# Patient Record
Sex: Male | Born: 1965
Health system: Southern US, Community
[De-identification: ages and names within clinical notes are randomized; demographics above are authoritative.]

## PROBLEM LIST (undated history)

## (undated) DIAGNOSIS — E785 Hyperlipidemia, unspecified: Secondary | ICD-10-CM

## (undated) DIAGNOSIS — N503 Cyst of epididymis: Secondary | ICD-10-CM

## (undated) DIAGNOSIS — Z8249 Family history of ischemic heart disease and other diseases of the circulatory system: Secondary | ICD-10-CM

## (undated) DIAGNOSIS — I10 Essential (primary) hypertension: Secondary | ICD-10-CM

## (undated) DIAGNOSIS — Z87898 Personal history of other specified conditions: Secondary | ICD-10-CM

## (undated) DIAGNOSIS — N433 Hydrocele, unspecified: Secondary | ICD-10-CM

## (undated) DIAGNOSIS — I445 Left posterior fascicular block: Secondary | ICD-10-CM

## (undated) DIAGNOSIS — Z973 Presence of spectacles and contact lenses: Secondary | ICD-10-CM

## (undated) HISTORY — DX: Hyperlipidemia, unspecified: E78.5

## (undated) HISTORY — PX: HERNIA REPAIR: SHX51

## (undated) HISTORY — DX: Essential (primary) hypertension: I10

---

## 2000-06-15 ENCOUNTER — Encounter: Payer: Self-pay | Admitting: Emergency Medicine

## 2000-06-15 ENCOUNTER — Emergency Department (HOSPITAL_COMMUNITY): Admission: EM | Admit: 2000-06-15 | Discharge: 2000-06-15 | Payer: Self-pay | Admitting: Emergency Medicine

## 2000-10-29 HISTORY — PX: WISDOM TOOTH EXTRACTION: SHX21

## 2005-06-11 ENCOUNTER — Encounter: Admission: RE | Admit: 2005-06-11 | Discharge: 2005-06-11 | Payer: Self-pay | Admitting: Family Medicine

## 2006-06-19 ENCOUNTER — Ambulatory Visit: Payer: Self-pay

## 2007-09-03 ENCOUNTER — Emergency Department (HOSPITAL_COMMUNITY): Admission: EM | Admit: 2007-09-03 | Discharge: 2007-09-03 | Payer: Self-pay | Admitting: Emergency Medicine

## 2010-10-29 HISTORY — PX: COLONOSCOPY: SHX174

## 2011-04-13 ENCOUNTER — Emergency Department (HOSPITAL_COMMUNITY): Payer: BC Managed Care – PPO

## 2011-04-13 ENCOUNTER — Observation Stay (HOSPITAL_COMMUNITY)
Admission: EM | Admit: 2011-04-13 | Discharge: 2011-04-14 | DRG: 143 | Disposition: A | Discharge status: Home / Self Care | Payer: BC Managed Care – PPO | Attending: Family Medicine | Admitting: Family Medicine

## 2011-04-13 DIAGNOSIS — I10 Essential (primary) hypertension: Secondary | ICD-10-CM

## 2011-04-13 DIAGNOSIS — K219 Gastro-esophageal reflux disease without esophagitis: Secondary | ICD-10-CM

## 2011-04-13 DIAGNOSIS — R1011 Right upper quadrant pain: Secondary | ICD-10-CM | POA: Diagnosis present

## 2011-04-13 DIAGNOSIS — R0789 Other chest pain: Principal | ICD-10-CM | POA: Diagnosis present

## 2011-04-13 DIAGNOSIS — R109 Unspecified abdominal pain: Secondary | ICD-10-CM

## 2011-04-13 DIAGNOSIS — K7689 Other specified diseases of liver: Secondary | ICD-10-CM | POA: Diagnosis present

## 2011-04-13 DIAGNOSIS — E669 Obesity, unspecified: Secondary | ICD-10-CM | POA: Diagnosis present

## 2011-04-13 DIAGNOSIS — E781 Pure hyperglyceridemia: Secondary | ICD-10-CM | POA: Diagnosis present

## 2011-04-13 DIAGNOSIS — Z7982 Long term (current) use of aspirin: Secondary | ICD-10-CM

## 2011-04-13 LAB — DIFFERENTIAL
Basophils Absolute: 0 10*3/uL (ref 0.0–0.1)
Basophils Relative: 0 % (ref 0–1)
Eosinophils Absolute: 0.2 10*3/uL (ref 0.0–0.7)
Eosinophils Relative: 4 % (ref 0–5)
Lymphocytes Relative: 18 % (ref 12–46)
Lymphs Abs: 1.1 10*3/uL (ref 0.7–4.0)
Monocytes Absolute: 0.2 10*3/uL (ref 0.1–1.0)
Monocytes Relative: 3 % (ref 3–12)
Neutro Abs: 4.8 10*3/uL (ref 1.7–7.7)
Neutrophils Relative %: 75 % (ref 43–77)

## 2011-04-13 LAB — HEPATIC FUNCTION PANEL
AST: 37 U/L (ref 0–37)
Albumin: 4.2 g/dL (ref 3.5–5.2)
Total Bilirubin: 1 mg/dL (ref 0.3–1.2)
Total Protein: 7.5 g/dL (ref 6.0–8.3)

## 2011-04-13 LAB — CK TOTAL AND CKMB (NOT AT ARMC)
CK, MB: 2.7 ng/mL (ref 0.3–4.0)
Relative Index: 1 (ref 0.0–2.5)
Total CK: 280 U/L — ABNORMAL HIGH (ref 7–232)

## 2011-04-13 LAB — LIPASE, BLOOD: Lipase: 31 U/L (ref 11–59)

## 2011-04-13 LAB — CBC
HCT: 44.8 % (ref 39.0–52.0)
Hemoglobin: 16.5 g/dL (ref 13.0–17.0)
MCH: 33.6 pg (ref 26.0–34.0)
MCHC: 36.8 g/dL — ABNORMAL HIGH (ref 30.0–36.0)
MCV: 91.2 fL (ref 78.0–100.0)
Platelets: 168 10*3/uL (ref 150–400)
RBC: 4.91 MIL/uL (ref 4.22–5.81)
RDW: 12.6 % (ref 11.5–15.5)
WBC: 6.4 10*3/uL (ref 4.0–10.5)

## 2011-04-13 LAB — BASIC METABOLIC PANEL
Calcium: 9.7 mg/dL (ref 8.4–10.5)
GFR calc Af Amer: >60 mL/min (ref >60)
GFR calc non Af Amer: >60 mL/min (ref >60)
Glucose, Bld: 120 mg/dL — ABNORMAL HIGH (ref 70–99)
Potassium: 4 mEq/L (ref 3.5–5.1)
Sodium: 137 mEq/L (ref 135–145)

## 2011-04-13 LAB — TROPONIN I
Troponin I: <0.3 ng/mL (ref <0.30)
Troponin I: <0.3 ng/mL (ref <0.30)

## 2011-04-14 LAB — CBC
HCT: 44.3 % (ref 39.0–52.0)
MCHC: 35.2 g/dL (ref 30.0–36.0)
Platelets: 181 10*3/uL (ref 150–400)
RDW: 12.6 % (ref 11.5–15.5)

## 2011-04-14 LAB — TSH: TSH: 1.884 u[IU]/mL (ref 0.350–4.500)

## 2011-04-14 LAB — COMPREHENSIVE METABOLIC PANEL
Albumin: 3.8 g/dL (ref 3.5–5.2)
BUN: 21 mg/dL (ref 6–23)
Calcium: 9.6 mg/dL (ref 8.4–10.5)
GFR calc Af Amer: >60 mL/min (ref >60)
Glucose, Bld: 103 mg/dL — ABNORMAL HIGH (ref 70–99)
Sodium: 138 mEq/L (ref 135–145)
Total Protein: 7 g/dL (ref 6.0–8.3)

## 2011-04-14 LAB — LIPID PANEL
Cholesterol: 188 mg/dL (ref 0–200)
HDL: 27 mg/dL — ABNORMAL LOW (ref >39)
Total CHOL/HDL Ratio: 7 RATIO

## 2011-04-14 LAB — HEMOGLOBIN A1C: Mean Plasma Glucose: 114 mg/dL (ref <117)

## 2011-04-16 NOTE — Discharge Summary (Signed)
  NAMEMarland Kitchen  Andrew [name], Andrew [name] NO.:  [id number]  MEDICAL RECORD NO.:  [id number]  LOCATION:  3742                         FACILITY:  [facility]  PHYSICIAN:  Andrew [name], M.D.DATE OF BIRTH:  30-Jul-1966  DATE OF ADMISSION:  04/13/2011 DATE OF DISCHARGE:  04/14/2011                              DISCHARGE SUMMARY   PRIMARY CARE PROVIDER:  Brett Canales A. Andrew Alberts, MD at [facility].  DISCHARGE DIAGNOSES: 1. Atypical chest pain. 2. Abdominal pain. 3. Gastroesophageal reflux disease likely. 4. Hypertension. 5. Obesity.  DISCHARGE MEDICATIONS: 1. Nitroglycerin 0.4 SL p.r.n. every 5 minutes x3 doses. 2. Omeprazole 20 mg daily by mouth. 3. Aspirin 81 mg daily by mouth. 4. Lisinopril/hydrochlorothiazide 10/12.5 daily by mouth. 5. Multivitamin daily. 6. Vitamin D3 2000 units daily.  LAB FINDINGS:  Comprehensive metabolic panel was essentially normal with exception of CO2 of 33.  Cardiac enzymes were negative x3.  Lipid profile showed total cholesterol 188, triglyceride 396, HDL 27, LDL 82. CBC was normal.  TSH 1.84, lipase was 31.  RADIOLOGY:  Chest x-ray showed no acute disease and abdominal ultrasound showed a normal gallbladder with a normal caliber common bile duct, mild diffuse fatty infiltration of the liver and the rest of his abdomen was normal.  BRIEF HOSPITAL COURSE:  [name] is a 46 year old male who presented with atypical chest pain on the 15th.  Please see HPI for further details of presenting symptoms.  He did well in the hospital. He was treated with a proton pump inhibitor which resulted in resolution of his abdominal discomfort.  At discharge he is feeling well.  On discharge given instructions for use of nitroglycerin and the importance of taking omeprazole.  Plan to follow up with his PCP for a risk factor stratification of stress test within the next 30 days and follow up comprehensive metabolic panel to ensure no worsening liver function  in the next 7 days.  ISSUES TO BE FOLLOWED UP:  Please see above. 1. Stress test preferably within next 30 days. 2. Comprehensive metabolic panel preferably within the next 7 days to     ensure no worsening liver function.  DISCHARGE INSTRUCTIONS:  Discussed with the patient signs and symptoms, return to healthcare, and how to use nitroglycerin and warning signs for MI.     Andrew Graham, MD   ______________________________ Andrew [name], M.D.    EC/MEDQ  D:  04/14/2011  T:  04/14/2011  Job:  [id number]  cc:   Andrew [name], M.D.  Electronically Signed by Andrew [name]  on 04/16/2011 09:20:54 AM Electronically Signed by Andrew [name] M.D. on 04/16/2011 12:39:56 PM

## 2011-04-16 NOTE — H&P (Signed)
NAMEMarland Kitchen  Andrew [name], Andrew [name] NO.:  [id number]  MEDICAL RECORD NO.:  [id number]  LOCATION:  3742                         FACILITY:  [facility]  PHYSICIAN:  Leighton Roach McDiarmid, M.D.DATE OF BIRTH:  11-19-1965  DATE OF ADMISSION:  04/13/2011 DATE OF DISCHARGE:                             HISTORY & PHYSICAL   PRIMARY CARE PROVIDER:  Stan Head. Cleta Alberts, MD, at [facility].  CHIEF COMPLAINT:  Chest pain yesterday evening following a large meal, felt like squeezing.  It was substernal, not radiating, better with exertion, was associated with burping.  Notes diaphoresis when he exerted.  Has changed his diet this week to a low-fat/high-fiber diet after Dr. Cleta Alberts told him that he had hypertriglyceridemia.  He felt weak most of this week.  No fevers or chills.  No abdominal pain, nausea, vomiting or diarrhea.  PAST MEDICAL HISTORY: 1. Hypertension. 2. Hypertriglyceridemia. 3. Obesity. 4. Normal stress exam 4 years ago per Dr. Cleta Alberts.  SURGICAL HISTORY:  No abdominal surgeries.  ALLERGIES:  No known drug allergies.  MEDICATIONS: 1. Lisinopril/hydrochlorothiazide 10/12.5 mg daily. 2. Aspirin 81 mg daily. 3. Vitamin D3 2000 units daily. 4. Multivitamin daily.  SOCIAL HISTORY:  No tobacco.  FAMILY HISTORY:  Mother and father both had myocardial infarction in their 60s.  Dad died at 81 years' old.  REVIEW OF SYSTEMS:  Please see HPI, otherwise is normal.  OBJECTIVE:  VITAL SIGNS:  Temperature 97.4, heart rate 97, blood pressure 131-160/74-89, respiratory 20, satting 95% on room air. GENERAL:  Obese, no acute distress. HEENT:  Moist mucous membranes.  EOMI. NECK:  Flat neck veins. HEART:  Regular rate and rhythm.  No murmurs, rubs, or gallops. LUNGS:  Clear to auscultation bilaterally.  Normal work of breathing. ABDOMEN:  Normoactive bowel sounds, nontender, no masses.  Positive tenderness to palpation in the right upper quadrant with a positive Murphy sign.  No rebound  or guarding. EXTREMITIES:  Nonedematous. NEURO:  Alert and oriented x3. MSK:  Normal diameter of lower extremities with normal muscle bulk.  LABS AND STUDIES:  CBC:  White count 6.4, hemoglobin 16.5, platelets 168.  Basic metabolic panel is within normal limits.  Cardiac enzymes are negative x1.  Chest x-ray shows nothing acute.  EKG shows normal sinus rhythm with left axis deviation which is unchanged from previous study.  No ST-segment changes or abnormalities.  ASSESSMENT/PLAN:  A 45 year old male with chest pain. 1. Chest pain, atypical.  EKG is unchanged and cardiac enzymes are     negative x1.  I feel like myocardial infarction or pulmonary     embolism is likely cause of his pain.  His Well score is 1.3.  He     is very low risk for pulmonary embolism.  I feel like GI etiology     is much more likely.  I plan to cycle his enzymes x3, monitor on     telemetry and repeat EKG in the morning.  We will get fasting lipid     and TSH then. 2. Abdominal tenderness noted on exam right per quadrant.  The patient     is obese and has recently changed his diet.  I suspect reflux with  cholecystitis to be a cause of his pain.  We do not have a CMP at     this time.  It is pending.  Plan to add CMP, get right upper     quadrant ultrasound to assess for gallstones, and we will follow.     We will allow a low-fat diet. 3. Hypertension.  Blood pressures control.  Plan to continue his home     ACE inhibitor, hydrochlorothiazide.  Monitor blood pressure while     here in the hospital. 4. FEN/GI.  Heart healthy diet and Protonix. 5. Obesity.  It is likely underlying cause.  We will follow up when he     is in outpatient. 6. Prophylaxis, on heparin and Protonix. 7. Code status.  Full code. 8. Disposition.  Ruled out and abdominal pain is resolved.     Clementeen Graham, MD   ______________________________ Leighton Roach McDiarmid, M.D.    EC/MEDQ  D:  04/13/2011  T:  04/14/2011  Job:   [id number]  Electronically Signed by Clementeen Graham  on 04/16/2011 09:20:38 AM Electronically Signed by Acquanetta Belling M.D. on 04/16/2011 12:39:54 PM

## 2011-04-18 ENCOUNTER — Encounter: Payer: Self-pay | Admitting: *Deleted

## 2011-04-18 ENCOUNTER — Encounter (HOSPITAL_COMMUNITY): Payer: BC Managed Care – PPO | Admitting: Radiology

## 2011-04-19 ENCOUNTER — Ambulatory Visit (INDEPENDENT_AMBULATORY_CARE_PROVIDER_SITE_OTHER): Payer: BC Managed Care – PPO | Admitting: Cardiovascular Disease

## 2011-04-19 ENCOUNTER — Encounter: Payer: Self-pay | Admitting: Cardiovascular Disease

## 2011-04-19 DIAGNOSIS — R079 Chest pain, unspecified: Secondary | ICD-10-CM | POA: Insufficient documentation

## 2011-04-19 NOTE — Progress Notes (Signed)
History of Present Illness:45 yo WM with history of HTN who is here today to establish cardiology care. He reports feeling "bad" over last week. Both parents had premature CAD. He has had no energy. Constant weakness. He has constant chest pressure, present most of the day. This changes some with position. No SOB. No dizziness, near syncope or syncope. He exercises every day. He does not smoke. He changed his diet several weeks ago. Admitted to [facility] 04/13/11 with chest pain.   Past Medical History  Diagnosis Date  . Hyperlipidemia   . Hypertension     Past Surgical History  Procedure Date  . Hernia repair   . Wisdom teeth removal     Current Outpatient Prescriptions  Medication Sig Dispense Refill  . aspirin 81 MG tablet Take 81 mg by mouth daily.        Marland Kitchen lisinopril-hydrochlorothiazide (PRINZIDE,ZESTORETIC) 10-12.5 MG per tablet Take 1 tablet by mouth daily.        . Multiple Vitamin (MULTIVITAMIN) tablet Take 1 tablet by mouth daily.        . nitroGLYCERIN (NITROSTAT) 0.4 MG SL tablet Place 0.4 mg under the tongue every 5 (five) minutes as needed.        Marland Kitchen omeprazole (PRILOSEC) 20 MG capsule Take 20 mg by mouth daily.          No Known Allergies  History   Social History  . Marital Status: Married    Spouse Name: N/A    Number of Children: N/A  . Years of Education: N/A   Occupational History  . Not on file.   Social History Main Topics  . Smoking status: Never Smoker   . Smokeless tobacco: Not on file  . Alcohol Use: Not on file  . Drug Use: Not on file  . Sexually Active: Not on file   Other Topics Concern  . Not on file   Social History Narrative  . No narrative on file    No family history on file.  Review of Systems:  As stated in the HPI and otherwise negative.   BP 126/74  Pulse 89  Resp 12  Ht 5\' 7"  (1.702 m)  Wt 265 lb (120.203 kg)  BMI 41.50 kg/m2  Physical Examination: General: Well developed, well nourished, NAD HEENT: OP  clear, mucus membranes moist SKIN: warm, dry. No rashes. Neuro: No focal deficits Musculoskeletal: Muscle strength 5/5 all ext Psychiatric: Mood and affect normal Neck: No JVD, no carotid bruits, no thyromegaly, no lymphadenopathy. Lungs:Clear bilaterally, no wheezes, rhonci, crackles Cardiovascular: Regular rate and rhythm. No murmurs, gallops or rubs. Abdomen:Soft. Bowel sounds present. Non-tender.  Extremities: No lower extremity edema. Pulses are 2 + in the bilateral DP/PT.  EKG: NSR, rate 81 bpm.

## 2011-04-19 NOTE — Assessment & Plan Note (Addendum)
Strong family history of CAD with weakness, fatigue, chest pain. He has hypertriglyceridemia and HTN. Will arrange exercise myoview to exclude ischemia. I have offered a treadmill only test but given his strong family history of CAD, he has stated that he would rather have a myoview.

## 2011-04-19 NOTE — Patient Instructions (Signed)
Your physician recommends that you schedule a follow-up appointment in: 3 weeks  Your physician has requested that you have en exercise stress myoview. For further information please visit [email or web address] Please follow instruction sheet, as given.

## 2011-04-23 ENCOUNTER — Ambulatory Visit (HOSPITAL_COMMUNITY): Payer: BC Managed Care – PPO | Attending: Internal Medicine | Admitting: Radiology

## 2011-04-23 DIAGNOSIS — R079 Chest pain, unspecified: Secondary | ICD-10-CM | POA: Insufficient documentation

## 2011-04-23 DIAGNOSIS — R0789 Other chest pain: Secondary | ICD-10-CM

## 2011-04-23 DIAGNOSIS — I4949 Other premature depolarization: Secondary | ICD-10-CM

## 2011-04-23 HISTORY — PX: CARDIOVASCULAR STRESS TEST: SHX262

## 2011-04-23 MED ORDER — TECHNETIUM TC 99M TETROFOSMIN IV KIT
11.0000 | PACK | Freq: Once | INTRAVENOUS | Status: AC | PRN
  Administered 2011-04-23: 11 via INTRAVENOUS

## 2011-04-23 MED ORDER — TECHNETIUM TC 99M TETROFOSMIN IV KIT
33.0000 | PACK | Freq: Once | INTRAVENOUS | Status: AC | PRN
  Administered 2011-04-23: 33 via INTRAVENOUS

## 2011-04-23 NOTE — Progress Notes (Signed)
[facility] SITE 3 NUCLEAR MED [address] [place] [address] [phone]  Cardiology Nuclear Med Study  CHIA ROCK is a 45 y.o. male [id number] December 10, 1965   Nuclear Med Background Indication for Stress Test:  Evaluation for Ischemia and Post Hospital: 04/14/11 CP,(-) enzymes x 3 History: '07  Myocardial Perfusion Study:Normal, EF=62%. Cardiac Risk Factors:Strong, Premature Family History - CAD, Hypertension and Lipids, Obesity  Symptoms:  Chest Pressure.  (last episode of chest discomfort: none since discharge), Diaphoresis, Fatigue, Palpitations and Rapid HR   Nuclear Pre-Procedure Caffeine/Decaff Intake:  None NPO After: 9:30pm   Lungs:  Clear. IV 0.9% NS with Angio Cath:  20g  IV Site: R Antecubital  IV Started by:  Irean Hong, RN  Chest Size (in):  52 Cup Size: n/a  Height: 5\' 7"  (1.702 m)  Weight:  264 lb (119.75 kg)  BMI:  Body mass index is 41.35 kg/(m^2). Tech Comments:  A.M. Meds. taken    Nuclear Med Study 1 or 2 day study: 1 day  Stress Test Type:  Stress  Reading MD: Dietrich Pates, MD  Order Authorizing Provider:  Verne Carrow, MD; GM:WNUUV Daub, MD  Resting Radionuclide: Technetium 85m Tetrofosmin  Resting Radionuclide Dose: 11 mCi   Stress Radionuclide:  Technetium 50m Tetrofosmin  Stress Radionuclide Dose: 33 mCi           Stress Protocol Rest HR: 62 Stress HR: 169  Rest BP: 121/72 Stress BP: 181/64  Exercise Time (min): 10:30 METS: 12.6   Predicted Max HR: 176 bpm % Max HR: 96.02 bpm Rate Pressure Product: 25366   Dose of Adenosine (mg):  n/a Dose of Lexiscan: n/a mg  Dose of Atropine (mg): n/a Dose of Dobutamine: n/a mcg/kg/min (at max HR)  Stress Test Technologist: Smiley Houseman, CMA-N  Nuclear Technologist:  Domenic Polite, CNMT     Rest Procedure:  Myocardial perfusion imaging was performed at rest 45 minutes following the intravenous administration of Technetium 49m Tetrofosmin.  Rest ECG: No acute  changes.  Stress Procedure:  The patient exercised for 10:30 on the treadmill utilizing the Bruce protocol.  The patient stopped due to leg fatigue and denied any chest pain.  There were no significant ST-T wave changes, only occasional PVC's.  Technetium 71m Tetrofosmin was injected at peak exercise and myocardial perfusion imaging was performed after a brief delay.  Stress ECG: No significant change from baseline ECG  QPS Raw Data Images:  Normal; no motion artifact; normal heart/lung ratio. Stress Images:  Normal homogeneous uptake in all areas of the myocardium. Rest Images:  Normal homogeneous uptake in all areas of the myocardium. Subtraction (SDS):  No evidence of ischemia. Transient Ischemic Dilatation (Normal <1.22): .91  Lung/Heart Ratio (Normal <0.45):  .36   Quantitative Gated Spect Images QGS EDV:  111 ml QGS ESV:  40 ml QGS cine images:  NL LV Function; NL Wall Motion QGS EF: 64%  Impression Exercise Capacity:  Excellent exercise capacity. BP Response:  Normal blood pressure response. Clinical Symptoms:  No chest pain. ECG Impression:  No significant ST segment change suggestive of ischemia. Comparison with Prior Nuclear Study: No change from previous exam.  Overall Impression:  Normal stress nuclear study.

## 2011-04-24 NOTE — Progress Notes (Signed)
Nuc med report routed to Dr. Clifton James 04/24/11 Andrew [name]

## 2011-04-25 ENCOUNTER — Encounter (HOSPITAL_COMMUNITY): Payer: BC Managed Care – PPO | Admitting: Radiology

## 2011-04-25 ENCOUNTER — Telehealth: Payer: Self-pay | Admitting: Cardiovascular Disease

## 2011-04-25 NOTE — Telephone Encounter (Signed)
Stress test results from monday

## 2011-04-25 NOTE — Telephone Encounter (Signed)
LM on identified voice mail that stress test was normal--nt

## 2011-04-26 NOTE — Telephone Encounter (Signed)
Pt called again to get results of myoview

## 2011-04-26 NOTE — Telephone Encounter (Signed)
PT AWARE OF MYOVIEW RESULTS./CY 

## 2011-04-27 ENCOUNTER — Institutional Professional Consult (permissible substitution): Payer: BC Managed Care – PPO | Admitting: Internal Medicine

## 2011-04-27 NOTE — Progress Notes (Signed)
Normal Stress Test. Can we let the pt know? Thanks, chris

## 2011-04-27 NOTE — Progress Notes (Signed)
Pt. Aware of stress test results

## 2011-05-15 ENCOUNTER — Ambulatory Visit: Payer: BC Managed Care – PPO | Admitting: Cardiovascular Disease

## 2011-06-25 ENCOUNTER — Encounter: Payer: Self-pay | Admitting: Cardiovascular Disease

## 2011-06-26 ENCOUNTER — Encounter: Payer: Self-pay | Admitting: Cardiovascular Disease

## 2011-06-26 ENCOUNTER — Ambulatory Visit (INDEPENDENT_AMBULATORY_CARE_PROVIDER_SITE_OTHER): Payer: BC Managed Care – PPO | Admitting: Cardiovascular Disease

## 2011-06-26 DIAGNOSIS — R079 Chest pain, unspecified: Secondary | ICD-10-CM

## 2011-06-26 NOTE — Progress Notes (Signed)
History of Present Illness:45 yo WM with history of HTN who is here today for cardiac follow up. I saw him in June 2012  to establish cardiology care. He reported feeling "bad" for one week. Both parents had premature CAD. He had no energy. Constant weakness. He has constant chest pressure, present most of the day. This changes some with position. No SOB. No dizziness, near syncope or syncope. He exercises every day. He does not smoke. He changed his diet several weeks ago. Admitted to [facility] 04/13/11 with chest pain.  Stress myoview with no ischemia. LVEF normal. He has been feeling great and exercising every day. No more chest pain.    Past Medical History  Diagnosis Date  . Hyperlipidemia   . Hypertension   . GERD (gastroesophageal reflux disease)     Past Surgical History  Procedure Date  . Hernia repair   . Wisdom teeth removal     Current Outpatient Prescriptions  Medication Sig Dispense Refill  . aspirin 81 MG tablet Take 81 mg by mouth daily.        . Cholecalciferol (VITAMIN D3) 2000 UNITS TABS Take by mouth.        Marland Kitchen lisinopril-hydrochlorothiazide (PRINZIDE,ZESTORETIC) 10-12.5 MG per tablet Take 1 tablet by mouth daily.        . Multiple Vitamin (MULTIVITAMIN) tablet Take 1 tablet by mouth daily.        . nitroGLYCERIN (NITROSTAT) 0.4 MG SL tablet Place 0.4 mg under the tongue every 5 (five) minutes as needed.        Marland Kitchen omeprazole (PRILOSEC) 20 MG capsule Take 20 mg by mouth daily.          No Known Allergies  History   Social History  . Marital Status: Married    Spouse Name: N/A    Number of Children: N/A  . Years of Education: N/A   Occupational History  . Not on file.   Social History Main Topics  . Smoking status: Never Smoker   . Smokeless tobacco: Not on file  . Alcohol Use: Not on file  . Drug Use: Not on file  . Sexually Active: Not on file   Other Topics Concern  . Not on file   Social History Narrative  . No narrative on file     Family History  Problem Relation Age of Onset  . Heart attack Mother   . Heart attack Father     Review of Systems:  As stated in the HPI and otherwise negative.   BP 124/72  Pulse 80  Ht 5\' 7"  (1.702 m)  Wt 282 lb (127.914 kg)  BMI 44.17 kg/m2  Physical Examination: General: Well developed, well nourished, NAD HEENT: OP clear, mucus membranes moist SKIN: warm, dry. No rashes. Neuro: No focal deficits Musculoskeletal: Muscle strength 5/5 all ext Psychiatric: Mood and affect normal Neck: No JVD, no carotid bruits, no thyromegaly, no lymphadenopathy. Lungs:Clear bilaterally, no wheezes, rhonci, crackles Cardiovascular: Regular rate and rhythm. No murmurs, gallops or rubs. Abdomen:Soft. Bowel sounds present. Non-tender.  Extremities: No lower extremity edema. Pulses are 2 + in the bilateral DP/PT.  Stress myoview 04/23/11: Stress Procedure: The patient exercised for 10:30 on the treadmill utilizing the Bruce protocol. The patient stopped due to leg fatigue and denied any chest pain. There were no significant ST-T wave changes, only occasional PVC's. Technetium 43m Tetrofosmin was injected at peak exercise and myocardial perfusion imaging was performed after a brief delay.  Stress ECG: No significant  change from baseline ECG  QPS  Raw Data Images: Normal; no motion artifact; normal heart/lung ratio.  Stress Images: Normal homogeneous uptake in all areas of the myocardium.  Rest Images: Normal homogeneous uptake in all areas of the myocardium.  Subtraction (SDS): No evidence of ischemia.  Transient Ischemic Dilatation (Normal <1.22): .91  Lung/Heart Ratio (Normal <0.45): .36  Quantitative Gated Spect Images  QGS EDV: 111 ml  QGS ESV: 40 ml  QGS cine images: NL LV Function; NL Wall Motion  QGS EF: 64%  Impression  Exercise Capacity: Excellent exercise capacity.  BP Response: Normal blood pressure response.  Clinical Symptoms: No chest pain.  ECG Impression: No significant  ST segment change suggestive of ischemia.  Comparison with Prior Nuclear Study: No change from previous exam.  Overall Impression: Normal stress nuclear study.

## 2011-06-26 NOTE — Patient Instructions (Signed)
Your physician recommends that you schedule a follow-up appointment in: 1 year  

## 2011-06-26 NOTE — Assessment & Plan Note (Signed)
Atypical chest pain. Stress myoview without ischemia. NO further cardiac workup.

## 2011-07-04 ENCOUNTER — Other Ambulatory Visit: Payer: Self-pay | Admitting: Family Medicine

## 2011-11-13 ENCOUNTER — Encounter (INDEPENDENT_AMBULATORY_CARE_PROVIDER_SITE_OTHER): Payer: BC Managed Care – PPO | Admitting: Emergency Medicine

## 2011-11-13 DIAGNOSIS — Z0000 Encounter for general adult medical examination without abnormal findings: Secondary | ICD-10-CM

## 2011-11-13 DIAGNOSIS — C439 Malignant melanoma of skin, unspecified: Secondary | ICD-10-CM

## 2011-11-13 DIAGNOSIS — I10 Essential (primary) hypertension: Secondary | ICD-10-CM

## 2011-12-13 ENCOUNTER — Telehealth: Payer: Self-pay

## 2012-01-22 ENCOUNTER — Encounter: Payer: Self-pay | Admitting: Emergency Medicine

## 2012-01-22 ENCOUNTER — Ambulatory Visit: Payer: BC Managed Care – PPO

## 2012-01-22 ENCOUNTER — Ambulatory Visit (INDEPENDENT_AMBULATORY_CARE_PROVIDER_SITE_OTHER): Payer: BC Managed Care – PPO | Admitting: Emergency Medicine

## 2012-01-22 VITALS — BP 120/80 | HR 72 | Temp 98.4°F | Resp 16 | Ht 66.0 in | Wt 269.0 lb

## 2012-01-22 DIAGNOSIS — R05 Cough: Secondary | ICD-10-CM

## 2012-01-22 DIAGNOSIS — E669 Obesity, unspecified: Secondary | ICD-10-CM

## 2012-01-22 DIAGNOSIS — R059 Cough, unspecified: Secondary | ICD-10-CM

## 2012-01-22 DIAGNOSIS — R739 Hyperglycemia, unspecified: Secondary | ICD-10-CM

## 2012-01-22 DIAGNOSIS — R5383 Other fatigue: Secondary | ICD-10-CM

## 2012-01-22 DIAGNOSIS — G4733 Obstructive sleep apnea (adult) (pediatric): Secondary | ICD-10-CM

## 2012-01-22 DIAGNOSIS — R5381 Other malaise: Secondary | ICD-10-CM

## 2012-01-22 LAB — POCT GLYCOSYLATED HEMOGLOBIN (HGB A1C): Hemoglobin A1C: 5.3

## 2012-01-22 LAB — PSA: PSA: 0.99 ng/mL (ref <=4.00)

## 2012-01-22 LAB — TSH: TSH: 1.003 u[IU]/mL (ref 0.350–4.500)

## 2012-01-22 NOTE — Progress Notes (Signed)
  Subjective:    Patient ID: Andrew [name], male    DOB: 1966-02-12, 46 y.o.   MRN: [id number]  HPI patient presents with an note from his wife requesting additional blood work. When seen here last he had CBC cholesterol Cmet none. His sugar was borderline elevated at 101 but otherwise his tests were within normal limits    Review of Systems Mrs. states he is weak and fatigued. He does have an diagnosis of obstructive sleep apnea and uses a CPAP machine.     Objective:   Physical Exam  Constitutional:       Physical exam is of an overweight male who is in no distress.  HENT:  Head: Normocephalic.  Eyes: Pupils are equal, round, and reactive to light.  Neck: No JVD present. No tracheal deviation present. No thyromegaly present.  Cardiovascular: Normal rate and regular rhythm.   Pulmonary/Chest: Effort normal. No respiratory distress. He has no wheezes. He has no rales. He exhibits no tenderness.  Lymphadenopathy:    He has no cervical adenopathy.      [facility] reading (PRIMARY) by  DrDaub chest x-ray shows no acute disease.      Assessment & Plan:  Patient's main complaint is of fatigue. I suspect most of this is related to his extreme obesity and obstructive sleep apnea. We'll check vitamin D testosterone levels. The CXR done was within normal limits. He is scheduled to see Dr. Lilian Kapur for colonoscopy because of a problem with rectal bleeding.

## 2012-01-23 LAB — VITAMIN D 25 HYDROXY (VIT D DEFICIENCY, FRACTURES): Vit D, 25-Hydroxy: 35 ng/mL (ref 30–89)

## 2012-01-27 ENCOUNTER — Telehealth: Payer: Self-pay

## 2012-01-27 NOTE — Telephone Encounter (Signed)
Pt returning call to clinical tl

## 2012-01-28 NOTE — Telephone Encounter (Signed)
Spoke with patient sent him copy of labs in mail

## 2012-01-29 ENCOUNTER — Telehealth: Payer: Self-pay

## 2012-01-29 NOTE — Telephone Encounter (Signed)
Okay to call in Daniel term 4 mg per 24 hours one patch daily #30 refill x5

## 2012-01-29 NOTE — Telephone Encounter (Signed)
Called [facility] and spoke w/pharmacy. Changed Rx to Androderm 4 mg patches as written by Dr Cleta Alberts and asked them to fax over a new prior auth req. They are getting it changed and faxing so a prior auth can be sent in on androderm.

## 2012-01-29 NOTE — Telephone Encounter (Signed)
Dr Cleta Alberts, I am trying to get a prior auth done for pt's testim gel and [employer] prefers androderm or androgel. They will not approve the testim until/unless pt has failed one of these. Can you change Rx and I can get a prior auth done for either of these and it should be approved?

## 2012-04-08 ENCOUNTER — Other Ambulatory Visit: Payer: Self-pay | Admitting: Physician Assistant

## 2012-04-08 MED ORDER — LISINOPRIL-HYDROCHLOROTHIAZIDE 10-12.5 MG PO TABS: 1.0000 | ORAL_TABLET | Freq: Every day | ORAL | Status: DC

## 2012-05-13 ENCOUNTER — Ambulatory Visit (INDEPENDENT_AMBULATORY_CARE_PROVIDER_SITE_OTHER): Payer: BC Managed Care – PPO | Admitting: Emergency Medicine

## 2012-05-13 ENCOUNTER — Encounter: Payer: Self-pay | Admitting: Emergency Medicine

## 2012-05-13 VITALS — BP 121/77 | HR 58 | Temp 98.1°F | Resp 16 | Ht 66.5 in | Wt 272.8 lb

## 2012-05-13 DIAGNOSIS — I10 Essential (primary) hypertension: Secondary | ICD-10-CM

## 2012-05-13 DIAGNOSIS — E291 Testicular hypofunction: Secondary | ICD-10-CM

## 2012-05-13 DIAGNOSIS — R739 Hyperglycemia, unspecified: Secondary | ICD-10-CM

## 2012-05-13 DIAGNOSIS — I152 Hypertension secondary to endocrine disorders: Secondary | ICD-10-CM | POA: Insufficient documentation

## 2012-05-13 DIAGNOSIS — R7309 Other abnormal glucose: Secondary | ICD-10-CM

## 2012-05-13 LAB — POCT GLYCOSYLATED HEMOGLOBIN (HGB A1C): Hemoglobin A1C: 5.3

## 2012-05-13 MED ORDER — TESTOSTERONE 30 MG/ACT TD SOLN: 60.0000 mg | Freq: Once | TRANSDERMAL | Status: DC

## 2012-05-13 NOTE — Progress Notes (Signed)
  Subjective:    Patient ID: Andrew [name], male    DOB: 1966/08/18, 46 y.o.   MRN: [id number]  HPI patient tried the testosterone patches but was unable to tolerate them due to severe rash that developed due to the patches. He is here today for followup of hyperglycemia and hypertension    Review of Systems     Objective:   Physical Exam  Eyes: Pupils are equal, round, and reactive to light.  Neck: No thyromegaly present.  Cardiovascular: Normal rate and regular rhythm.   Pulmonary/Chest: Effort normal.   Results for orders placed in visit on 05/13/12  POCT GLYCOSYLATED HEMOGLOBIN (HGB A1C)      Component Value Range   Hemoglobin A1C 5.3           Assessment & Plan:     Will try axiron due to severe rash from patches. No change in his other medicines. Sugar is under good control with an A1c of 5 point 3.

## 2012-05-19 ENCOUNTER — Encounter: Payer: Self-pay | Admitting: Emergency Medicine

## 2012-05-19 NOTE — Progress Notes (Signed)
/  Completed prior auth on pt's Axiron Rx and received approval from 05/15/12 - 02/08/15. Faxed approval notice to pharmacy.

## 2012-09-09 ENCOUNTER — Ambulatory Visit (INDEPENDENT_AMBULATORY_CARE_PROVIDER_SITE_OTHER): Payer: BC Managed Care – PPO | Admitting: Emergency Medicine

## 2012-09-09 ENCOUNTER — Encounter: Payer: Self-pay | Admitting: Emergency Medicine

## 2012-09-09 VITALS — BP 126/92 | HR 67 | Temp 97.9°F | Resp 16 | Ht 67.0 in | Wt 273.8 lb

## 2012-09-09 DIAGNOSIS — E291 Testicular hypofunction: Secondary | ICD-10-CM

## 2012-09-09 DIAGNOSIS — I10 Essential (primary) hypertension: Secondary | ICD-10-CM

## 2012-09-09 LAB — COMPREHENSIVE METABOLIC PANEL
Alkaline Phosphatase: 49 U/L (ref 39–117)
BUN: 26 mg/dL — ABNORMAL HIGH (ref 6–23)
Glucose, Bld: 105 mg/dL — ABNORMAL HIGH (ref 70–99)
Total Bilirubin: 1.1 mg/dL (ref 0.3–1.2)

## 2012-09-09 NOTE — Progress Notes (Signed)
  Subjective:    Patient ID: Andrew [name], male    DOB: 12-Dec-1965, 46 y.o.   MRN: [id number]  HPI patient here to followup on hypogonadism. He tried the patches for testosterone replacement and then tried Actron in both of these calls extreme topical allergic reactions. He seemed to discuss a day alternative treatments. He also is on hypertensive medications were his biggest complaint is inability to lose weight    Review of Systems     Objective:   Physical Exam HEENT exam is unremarkable his chest is clear his cardiac exam is unremarkable. Lungs are clear to auscultation and percussion abdomen soft        Assessment & Plan:  Will refer to endocrinology to get their opinion regarding testosterone replacement. We'll continue his current blood pressure medications. Give him some information about obesity and see if he might be allergic continue to work on weight loss.

## 2012-09-10 LAB — TESTOSTERONE, FREE, TOTAL, SHBG
Sex Hormone Binding: 22 nmol/L (ref 13–71)
Testosterone, Free: 47 pg/mL (ref 47.0–244.0)
Testosterone-% Free: 2.4 % (ref 1.6–2.9)

## 2012-09-11 NOTE — Addendum Note (Signed)
Addended by: Gerrianne Scale on: 09/11/2012 11:29 AM   Modules accepted: Orders

## 2012-09-19 ENCOUNTER — Ambulatory Visit: Payer: BC Managed Care – PPO | Admitting: Endocrinology

## 2012-10-31 ENCOUNTER — Ambulatory Visit (INDEPENDENT_AMBULATORY_CARE_PROVIDER_SITE_OTHER): Payer: BC Managed Care – PPO | Admitting: Emergency Medicine

## 2012-10-31 VITALS — BP 134/81 | HR 78 | Temp 98.5°F | Resp 16 | Ht 68.0 in | Wt 278.6 lb

## 2012-10-31 DIAGNOSIS — R21 Rash and other nonspecific skin eruption: Secondary | ICD-10-CM

## 2012-10-31 DIAGNOSIS — L0390 Cellulitis, unspecified: Secondary | ICD-10-CM

## 2012-10-31 DIAGNOSIS — R1900 Intra-abdominal and pelvic swelling, mass and lump, unspecified site: Secondary | ICD-10-CM

## 2012-10-31 DIAGNOSIS — L309 Dermatitis, unspecified: Secondary | ICD-10-CM

## 2012-10-31 LAB — POCT SKIN KOH: Skin KOH, POC: NEGATIVE

## 2012-10-31 MED ORDER — DOXYCYCLINE HYCLATE 100 MG PO CAPS: 100.0000 mg | ORAL_CAPSULE | Freq: Two times a day (BID) | ORAL | Status: DC

## 2012-10-31 MED ORDER — TRIAMCINOLONE 0.1 % CREAM:EUCERIN CREAM 1:1: 1.0000 "application " | TOPICAL_CREAM | Freq: Two times a day (BID) | CUTANEOUS | Status: DC | PRN

## 2012-10-31 NOTE — Progress Notes (Signed)
  Subjective:    Patient ID: Andrew [name], male    DOB: 10/13/1966, 47 y.o.   MRN: [id number]  HPI patient enters with a one-month history of a rash over both lower legs. The area is extremely pruritic. It is not painful at present but apparently was very red earlier in the week but  the redness has improved. Patient has a full sensation upper abdomen. He has not had any vomiting. He has had no GI symptoms at all. He states he feels well .    Review of Systems     Objective:   Physical Exam  Constitutional: He appears well-developed.  HENT:  Right Ear: External ear normal.  Left Ear: External ear normal.  Eyes: Pupils are equal, round, and reactive to light.  Neck: No thyromegaly present.  Cardiovascular: Normal rate and regular rhythm.   Pulmonary/Chest: Breath sounds normal.  Abdominal:       There is fullness in the right upper abdomen but I do not feel a mass  Skin:       There is redness in this scaling involving both shins. The open areas some of which are weeping.   Results for orders placed in visit on 10/31/12  POCT SKIN KOH      Component Value Range   Skin KOH, POC Negative           Assessment & Plan:      It appears the patient has  atopic eczema involving his shins. We'll treat distribution triamcinolone mixed. Will cover him with doxycycline for secondary infection. He is going to followup with Dr. to 15

## 2012-10-31 NOTE — Patient Instructions (Addendum)

## 2012-11-02 LAB — WOUND CULTURE

## 2012-11-06 ENCOUNTER — Ambulatory Visit
Admission: RE | Admit: 2012-11-06 | Discharge: 2012-11-06 | Disposition: A | Discharge status: Home / Self Care | Payer: BC Managed Care – PPO | Source: Ambulatory Visit | Attending: Emergency Medicine | Admitting: Emergency Medicine

## 2012-11-06 DIAGNOSIS — R1900 Intra-abdominal and pelvic swelling, mass and lump, unspecified site: Secondary | ICD-10-CM

## 2012-11-11 ENCOUNTER — Telehealth: Payer: Self-pay

## 2012-11-11 NOTE — Telephone Encounter (Signed)
[place] normal per Dr Cleta Alberts, called patient to advise.

## 2012-11-11 NOTE — Telephone Encounter (Signed)
Pt is looking for ultrasound results  Please call [phone]

## 2012-11-14 ENCOUNTER — Telehealth: Payer: Self-pay

## 2012-11-14 DIAGNOSIS — L309 Dermatitis, unspecified: Secondary | ICD-10-CM

## 2012-11-14 DIAGNOSIS — L0390 Cellulitis, unspecified: Secondary | ICD-10-CM

## 2012-11-14 NOTE — Telephone Encounter (Signed)
Pt's wife is calling requesting a refill on the antibiotic and the cream pt was prescribed during his last visit with Dr.Daub. Pt's rash seemed to be improving while taking the antibiotic but now that he has completed the antibiotic it has come back. Please adivise. (Pts wife requests to only speak with Dr.Daub) [phone]# [phone] Pharmacy: [facility]

## 2012-11-16 ENCOUNTER — Telehealth: Payer: Self-pay | Admitting: Radiology

## 2012-11-16 MED ORDER — DOXYCYCLINE HYCLATE 100 MG PO CAPS: 100.0000 mg | ORAL_CAPSULE | Freq: Two times a day (BID) | ORAL | Status: DC

## 2012-11-16 MED ORDER — TRIAMCINOLONE 0.1 % CREAM:EUCERIN CREAM 1:1: 1.0000 "application " | TOPICAL_CREAM | Freq: Two times a day (BID) | CUTANEOUS | Status: DC | PRN

## 2012-11-16 NOTE — Telephone Encounter (Signed)
Pharmacy called about the compounded rx advised 1:1 ratio 420gms Amy

## 2012-11-16 NOTE — Telephone Encounter (Signed)
Ok to refill both meds. See me no improvement if at completion of this treatment course.

## 2012-11-16 NOTE — Telephone Encounter (Signed)
Called wife, [facility] RX sent to pharmacy.

## 2012-12-26 NOTE — Telephone Encounter (Signed)
error 

## 2013-01-13 ENCOUNTER — Encounter: Payer: Self-pay | Admitting: Emergency Medicine

## 2013-01-13 ENCOUNTER — Ambulatory Visit (INDEPENDENT_AMBULATORY_CARE_PROVIDER_SITE_OTHER): Payer: BC Managed Care – PPO | Admitting: Emergency Medicine

## 2013-01-13 VITALS — BP 138/82 | HR 78 | Temp 98.0°F | Resp 17 | Ht 67.0 in | Wt 278.0 lb

## 2013-01-13 DIAGNOSIS — L0390 Cellulitis, unspecified: Secondary | ICD-10-CM

## 2013-01-13 DIAGNOSIS — R21 Rash and other nonspecific skin eruption: Secondary | ICD-10-CM

## 2013-01-13 DIAGNOSIS — L0291 Cutaneous abscess, unspecified: Secondary | ICD-10-CM

## 2013-01-13 LAB — POCT SKIN KOH: Skin KOH, POC: NEGATIVE

## 2013-01-13 MED ORDER — DOXYCYCLINE HYCLATE 100 MG PO CAPS
100.0000 mg | ORAL_CAPSULE | Freq: Two times a day (BID) | ORAL | Status: DC
Start: 2013-01-13 — End: 2013-09-29

## 2013-01-13 NOTE — Progress Notes (Signed)
  Subjective:    Patient ID: Andrew [name], male    DOB: [birth year], 47 y.o.   MRN: [id number]  HPI  Patient comes in with a chief complain of leg swelling and rash for 6 months (since August). Left leg swelling with rash on anterior leg.  No pain. Used to bleed while in the shower.     Previous history of chest pain (2 years ago). The patient had a thorough evaluation with the cardiologist with negative findings. He was seen recently and treated for an eczema type eruption on his shins which was secondarily infected and he took antibiotics and then apply Eucerin and triamcinolone mixed. Patient states he feels like the [employer] worked better.    Review of Systems     Objective:   Physical Exam examination legs reveals no calf tenderness there is a negative Homans sign. There is skin discoloration present over both shins with some scaling there is no evidence of active cellulitis at present time Results for orders placed in visit on 10/31/12  WOUND CULTURE      Result Value Range   Culture Few STAPHYLOCOCCUS AUREUS     GRAM STAIN No WBC Seen     GRAM STAIN Rare Squamous Epithelial Cells Present     GRAM STAIN Few GRAM POSITIVE COCCI IN PAIRS     Organism ID, Bacteria STAPHYLOCOCCUS AUREUS    POCT SKIN KOH      Result Value Range   Skin KOH, POC Negative           Assessment & Plan:  I think are going and give him some plain Diprolene but he can alternate with the Gold Bond. I'm going to encourage him to lose weight and wear support stockings to see if that would help with his varicose veins. He has physical scheduled in the near future. Patient will be on doxycycline for 10 days. His last culture grew MSSA.

## 2013-01-19 ENCOUNTER — Other Ambulatory Visit: Payer: Self-pay | Admitting: Emergency Medicine

## 2013-02-03 ENCOUNTER — Ambulatory Visit (INDEPENDENT_AMBULATORY_CARE_PROVIDER_SITE_OTHER): Payer: BC Managed Care – PPO | Admitting: Emergency Medicine

## 2013-02-03 ENCOUNTER — Encounter: Payer: Self-pay | Admitting: Emergency Medicine

## 2013-02-03 VITALS — BP 138/88 | HR 86 | Temp 98.3°F | Resp 16 | Ht 66.5 in | Wt 276.2 lb

## 2013-02-03 DIAGNOSIS — R7309 Other abnormal glucose: Secondary | ICD-10-CM

## 2013-02-03 DIAGNOSIS — R21 Rash and other nonspecific skin eruption: Secondary | ICD-10-CM

## 2013-02-03 DIAGNOSIS — I10 Essential (primary) hypertension: Secondary | ICD-10-CM

## 2013-02-03 DIAGNOSIS — Z0000 Encounter for general adult medical examination without abnormal findings: Secondary | ICD-10-CM

## 2013-02-03 DIAGNOSIS — E781 Pure hyperglyceridemia: Secondary | ICD-10-CM

## 2013-02-03 LAB — COMPREHENSIVE METABOLIC PANEL
ALT: 38 U/L (ref 0–53)
AST: 25 U/L (ref 0–37)
Alkaline Phosphatase: 61 U/L (ref 39–117)
CO2: 29 mEq/L (ref 19–32)
Sodium: 141 mEq/L (ref 135–145)
Total Bilirubin: 1.1 mg/dL (ref 0.3–1.2)
Total Protein: 7.6 g/dL (ref 6.0–8.3)

## 2013-02-03 LAB — LIPID PANEL
Cholesterol: 167 mg/dL (ref 0–200)
LDL Cholesterol: 59 mg/dL (ref 0–99)
Total CHOL/HDL Ratio: 4.5 Ratio
VLDL: 71 mg/dL — ABNORMAL HIGH (ref 0–40)

## 2013-02-03 LAB — POCT URINALYSIS DIPSTICK
Leukocytes, UA: NEGATIVE
Nitrite, UA: NEGATIVE
Protein, UA: NEGATIVE
Urobilinogen, UA: 0.2

## 2013-02-03 LAB — CBC WITH DIFFERENTIAL/PLATELET
Basophils Relative: 0 % (ref 0–1)
Eosinophils Absolute: 0.2 10*3/uL (ref 0.0–0.7)
Eosinophils Relative: 3 % (ref 0–5)
MCH: 31.9 pg (ref 26.0–34.0)
MCHC: 34.5 g/dL (ref 30.0–36.0)
Neutrophils Relative %: 65 % (ref 43–77)
Platelets: 168 10*3/uL (ref 150–400)

## 2013-02-03 LAB — TSH: TSH: 1.142 u[IU]/mL (ref 0.350–4.500)

## 2013-02-03 LAB — POCT SKIN KOH: Skin KOH, POC: NEGATIVE

## 2013-02-03 LAB — POCT GLYCOSYLATED HEMOGLOBIN (HGB A1C): Hemoglobin A1C: 5.4

## 2013-02-03 LAB — PSA: PSA: 0.85 ng/mL (ref <=4.00)

## 2013-02-03 MED ORDER — LISINOPRIL-HYDROCHLOROTHIAZIDE 20-25 MG PO TABS: 1.0000 | ORAL_TABLET | Freq: Every day | ORAL | Status: DC

## 2013-02-03 NOTE — Progress Notes (Signed)
  Subjective:    Patient ID: Andrew [name], male    DOB: 11/09/1965, 47 y.o.   MRN: [id number]  HPI    Review of Systems  Constitutional: Negative.   Eyes: Negative.   Respiratory: Negative.   Cardiovascular: Negative.   Gastrointestinal: Negative.   Genitourinary: Negative.   Musculoskeletal: Negative.   Skin: Positive for rash.  Allergic/Immunologic: Negative.   Neurological: Negative.   Hematological: Negative.   Psychiatric/Behavioral: Negative.        Objective:   Physical Exam        Assessment & Plan:

## 2013-02-03 NOTE — Progress Notes (Signed)
  Subjective:    Patient ID: Andrew [name], male    DOB: 1965-12-23, 47 y.o.   MRN: [id number]  HPI Here for annual physical. Was seen a 3/18 for rash on legs. Was started on testosterone and started with a rash. Grew out MSSA. Took 10 days of doxycycline. Has been using gold bond and diprolene. Gold bond worked better so he is using only that. Other than legs, reports no problems. Is on blood pressure medication. Says it is controlled. Has not had any chest pain recently. Never took the Prilosec.  Is waiting to make appointments with cardiology and GI, for endoscopy, and derm due to history of melanoma.  Thinks last tetanus shot was 2-3 years ago when he received stitches to his hand.    Review of Systems  Constitutional: Negative.   HENT: Positive for congestion.   Respiratory: Negative.   Cardiovascular: Positive for leg swelling.  Gastrointestinal: Negative for diarrhea and constipation.  Musculoskeletal: Negative.   Skin: Positive for rash (bilateral lower legs).  Neurological: Negative.        Objective:   Physical Exam  Constitutional: He is oriented to person, place, and time. He appears well-developed and well-nourished.  HENT:  Head: Normocephalic and atraumatic.  Eyes: EOM are normal. Pupils are equal, round, and reactive to light.  Neck: Normal range of motion. Neck supple.  Cardiovascular: Normal rate, regular rhythm, normal heart sounds and intact distal pulses.   Pulmonary/Chest: Effort normal and breath sounds normal.  Abdominal: Soft. Bowel sounds are normal.  Musculoskeletal:       Right ankle: He exhibits swelling (diffuse 1+-2 pitting edema).       Left ankle: He exhibits swelling (1+-2 pitting edema).  Neurological: He is alert and oriented to person, place, and time.  Skin: Skin is warm and dry. Rash (resolving over both lower extermities) noted.          Assessment & Plan:  I've increased his blood pressure medication that should help some with  his edema. He is going to make his own followup appointments with the dermatologist and with his cardiologist.

## 2013-03-26 ENCOUNTER — Encounter: Payer: Self-pay | Admitting: Cardiovascular Disease

## 2013-03-26 ENCOUNTER — Ambulatory Visit (INDEPENDENT_AMBULATORY_CARE_PROVIDER_SITE_OTHER): Payer: BC Managed Care – PPO | Admitting: Cardiovascular Disease

## 2013-03-26 VITALS — BP 128/84 | HR 68 | Ht 67.0 in | Wt 271.0 lb

## 2013-03-26 DIAGNOSIS — R079 Chest pain, unspecified: Secondary | ICD-10-CM

## 2013-03-26 NOTE — Patient Instructions (Addendum)
Your physician wants you to follow-up in:  12 months.  You will receive a reminder letter in the mail two months in advance. If you don't receive a letter, please call our office to schedule the follow-up appointment.   

## 2013-03-26 NOTE — Progress Notes (Signed)
History of Present Illness: 47 yo WM with history of HTN who is here today for cardiac follow up. I saw him in June 2012 to establish cardiology care. He reported feeling "bad" for one week. Both parents had premature CAD. He had no energy but constant weakness and constant chest pressure.  Admitted to [facility] 04/13/11 with chest pain. Stress myoview with no ischemia. LVEF normal. His symptoms improved shortly after discharge. He was last seen here August 2012.   He is here today for follow up. He tells me that he has been feeling well. He has had no exertional chest pain or pressure. He does have some resting chest burning that feels like a "pulled muscle". This lasts for several minutes to several hours. He had some lower extremity edema earlier this year that has resolved.   Primary Care Physician: Lesle Chris  Last Lipid Profile:Lipid Panel     Component Value Date/Time   CHOL 167 02/03/2013 1636   TRIG 355* 02/03/2013 1636   HDL 37* 02/03/2013 1636   CHOLHDL 4.5 02/03/2013 1636   VLDL 71* 02/03/2013 1636   LDLCALC 59 02/03/2013 1636     Past Medical History  Diagnosis Date  . Hyperlipidemia   . Hypertension   . GERD (gastroesophageal reflux disease)     Past Surgical History  Procedure Laterality Date  . Wisdom teeth removal    . Hernia repair      when baby    Current Outpatient Prescriptions  Medication Sig Dispense Refill  . aspirin 81 MG tablet Take 81 mg by mouth daily.        . Cholecalciferol (VITAMIN D3) 2000 UNITS TABS Take by mouth.        . doxycycline (VIBRAMYCIN) 100 MG capsule Take 1 capsule (100 mg total) by mouth 2 (two) times daily.  20 capsule  0  . lisinopril-hydrochlorothiazide (PRINZIDE,ZESTORETIC) 20-25 MG per tablet Take 1 tablet by mouth daily.  90 tablet  3  . Multiple Vitamin (MULTIVITAMIN) tablet Take 1 tablet by mouth daily.        . nitroGLYCERIN (NITROSTAT) 0.4 MG SL tablet Place 0.4 mg under the tongue every 5 (five) minutes as needed.         Marland Kitchen omeprazole (PRILOSEC) 20 MG capsule Take 20 mg by mouth daily.         No current facility-administered medications for this visit.    Allergies  Allergen Reactions  . Testosterone Rash    SEVERE WITH ITCHING    History   Social History  . Marital Status: Married    Spouse Name: N/A    Number of Children: N/A  . Years of Education: N/A   Occupational History  . Not on file.   Social History Main Topics  . Smoking status: Never Smoker   . Smokeless tobacco: Not on file  . Alcohol Use: Yes     Comment: very seldom  . Drug Use: No  . Sexually Active: Yes    Birth Control/ Protection: None     Comment: number sex partners in the last 12 months  1   Other Topics Concern  . Not on file   Social History Narrative   Exercise 2 times a week  On treadmill    Family History  Problem Relation Age of Onset  . Heart attack Mother   . Heart attack Father     Review of Systems:  As stated in the HPI and otherwise negative.  BP 128/84  Pulse 68  Ht 5\' 7"  (1.702 m)  Wt 271 lb (122.925 kg)  BMI 42.43 kg/m2  Physical Examination: General: Well developed, well nourished, NAD HEENT: OP clear, mucus membranes moist SKIN: warm, dry. No rashes. Neuro: No focal deficits Musculoskeletal: Muscle strength 5/5 all ext Psychiatric: Mood and affect normal Neck: No JVD, no carotid bruits, no thyromegaly, no lymphadenopathy. Lungs:Clear bilaterally, no wheezes, rhonci, crackles Cardiovascular: Regular rate and rhythm. No murmurs, gallops or rubs. Abdomen:Soft. Bowel sounds present. Non-tender.  Extremities: No lower extremity edema. Pulses are 2 + in the bilateral DP/PT.   Assessment and Plan:   1. Chest pain: Atypical. No exertional chest pressure or SOB. BP and lipids well controlled. He has plans to see Dr. Lottie Mussel in GI next week and possible endoscopy. His symptoms are most likely GERD related. He has not started his PPI as advised.

## 2013-04-29 ENCOUNTER — Telehealth: Payer: Self-pay

## 2013-04-29 NOTE — Telephone Encounter (Signed)
Patient would like referral for a sleep study if possible he did not go to the last one because he could not afford it.

## 2013-04-29 NOTE — Telephone Encounter (Signed)
Can we resend referral? 

## 2013-05-08 NOTE — Telephone Encounter (Signed)
Andrew [name], just checking to see if this has been completed? Its still showing as an open addendum.

## 2013-08-04 ENCOUNTER — Encounter: Payer: Self-pay | Admitting: Emergency Medicine

## 2013-08-04 ENCOUNTER — Ambulatory Visit: Payer: BC Managed Care – PPO | Admitting: Emergency Medicine

## 2013-08-04 ENCOUNTER — Ambulatory Visit (INDEPENDENT_AMBULATORY_CARE_PROVIDER_SITE_OTHER): Payer: BC Managed Care – PPO | Admitting: Emergency Medicine

## 2013-08-04 ENCOUNTER — Ambulatory Visit: Payer: BC Managed Care – PPO

## 2013-08-04 VITALS — BP 130/94 | HR 67 | Temp 97.9°F | Resp 16 | Ht 67.5 in | Wt 275.0 lb

## 2013-08-04 DIAGNOSIS — E785 Hyperlipidemia, unspecified: Secondary | ICD-10-CM

## 2013-08-04 DIAGNOSIS — I25810 Atherosclerosis of coronary artery bypass graft(s) without angina pectoris: Secondary | ICD-10-CM

## 2013-08-04 DIAGNOSIS — R7309 Other abnormal glucose: Secondary | ICD-10-CM

## 2013-08-04 DIAGNOSIS — R739 Hyperglycemia, unspecified: Secondary | ICD-10-CM

## 2013-08-04 DIAGNOSIS — L739 Follicular disorder, unspecified: Secondary | ICD-10-CM

## 2013-08-04 LAB — GLUCOSE, POCT (MANUAL RESULT ENTRY): POC Glucose: 97 mg/dl (ref 70–99)

## 2013-08-04 MED ORDER — MUPIROCIN 2 % EX OINT: TOPICAL_OINTMENT | Freq: Three times a day (TID) | CUTANEOUS | Status: DC

## 2013-08-04 NOTE — Progress Notes (Signed)
  Subjective:    Patient ID: Andrew [name], male    DOB: 22-Jul-1966, 47 y.o.   MRN: [id number]  HPI Patient here to followup on hypertension hypertriglyceridemia as well as hyperglycemia. Since his last visit here he has been to the cardiologist for a checkup. He has also been seen by GI and diagnosed with acute Kobacker. He was treated with antibiotics for this. He has a lesion on his right deltoid area but has not been back to see the dermatologist recently. He is a patient of Dr. Jorja Loa Has a history of melanoma in the distant past.   Review of Systems     Objective:   Physical Exam HEENT exam is unremarkable. Neck supple chest clear heart regular rate no murmurs abdomen was obese without masses he has a small umbilical hernia extremities are without edema.  [facility] reading (PRIMARY) by  Dr. Cleta Alberts there is no acute disease there is no cardiomegaly.   Results for orders placed in visit on 08/04/13  GLUCOSE, POCT (MANUAL RESULT ENTRY)      Result Value Range   POC Glucose 97  70 - 99 mg/dl  POCT GLYCOSYLATED HEMOGLOBIN (HGB A1C)      Result Value Range   Hemoglobin A1C 5.4          Assessment & Plan:  He was given Bactroban ointment for sores on his right shoulder and right hand. He has an appointment to see Dr.Tafeen for followup.

## 2013-08-05 LAB — LIPID PANEL
HDL: 34 mg/dL — ABNORMAL LOW (ref >39)
Total CHOL/HDL Ratio: 4.4 Ratio
VLDL: 54 mg/dL — ABNORMAL HIGH (ref 0–40)

## 2013-08-05 LAB — COMPREHENSIVE METABOLIC PANEL
ALT: 45 U/L (ref 0–53)
AST: 29 U/L (ref 0–37)
BUN: 23 mg/dL (ref 6–23)
Creat: 1.01 mg/dL (ref 0.50–1.35)
Total Bilirubin: 1.1 mg/dL (ref 0.3–1.2)

## 2013-09-08 ENCOUNTER — Other Ambulatory Visit: Payer: Self-pay | Admitting: Physician Assistant

## 2013-09-29 ENCOUNTER — Ambulatory Visit (INDEPENDENT_AMBULATORY_CARE_PROVIDER_SITE_OTHER): Payer: BC Managed Care – PPO | Admitting: Family Medicine

## 2013-09-29 VITALS — BP 132/90 | HR 70 | Temp 98.3°F | Resp 18 | Ht 66.75 in | Wt 278.2 lb

## 2013-09-29 DIAGNOSIS — T1592XA Foreign body on external eye, part unspecified, left eye, initial encounter: Secondary | ICD-10-CM

## 2013-09-29 DIAGNOSIS — H5710 Ocular pain, unspecified eye: Secondary | ICD-10-CM

## 2013-09-29 DIAGNOSIS — H5712 Ocular pain, left eye: Secondary | ICD-10-CM

## 2013-09-29 DIAGNOSIS — T1590XA Foreign body on external eye, part unspecified, unspecified eye, initial encounter: Secondary | ICD-10-CM

## 2013-09-29 MED ORDER — OFLOXACIN 0.3 % OP SOLN: 2.0000 [drp] | OPHTHALMIC | Status: DC

## 2013-09-29 NOTE — Patient Instructions (Signed)
1.  APPLY DROPS EVERY TWO HOURS FOR FIRST 24 HOURS; THEN ADMINISTER EYE DROPS EVERY FOUR HOURS.

## 2013-09-29 NOTE — Progress Notes (Signed)
Subjective:  This chart was scribed for Ethelda Chick, MD by Leone Payor, ED Scribe. This patient was seen in room 14 and the patient's care was started 10:13 AM.    Patient ID: Andrew [name], male    DOB: 04-07-1966, 47 y.o.   MRN: [id number]  HPI  .HPI Comments: Andrew [name] is a 47 y.o. male who presents to [facility] complaining of a foreign body to the left eye that occurred 2 days ago. Pt also has constant, unchanged left eye pain with associated tearing since then. Pt states he was grinding metal and was wearing eyeglasses when this occurred. Pt states he can see a piece of material in his eye and his wife attempted to remove it. He has tried applying an OTC eye drop which provided pain relief. He denies blurry vision or photophobia. Tetanus has been updated in the past 5-10 years.     Past Medical History  Diagnosis Date  . Hyperlipidemia   . Hypertension   . GERD (gastroesophageal reflux disease)   . Obesity (BMI 35.0-39.9 without comorbidity)     Allergies  Allergen Reactions  . Testosterone Rash    SEVERE WITH ITCHING   Prior to Admission medications   Medication Sig Start Date End Date Taking? Authorizing Provider  aspirin 81 MG tablet Take 81 mg by mouth daily.     Yes Historical Provider, MD  Cholecalciferol (VITAMIN D3) 2000 UNITS TABS Take by mouth.     Yes Historical Provider, MD  fish oil-omega-3 fatty acids 1000 MG capsule Take 2 g by mouth daily.   Yes Historical Provider, MD  lisinopril-hydrochlorothiazide (PRINZIDE,ZESTORETIC) 10-12.5 MG per tablet TAKE 1 TABLET BY MOUTH DAILY. 09/08/13  Yes Collene Gobble, MD  Multiple Vitamin (MULTIVITAMIN) tablet Take 1 tablet by mouth daily.     Yes Historical Provider, MD  omeprazole (PRILOSEC) 20 MG capsule Take 20 mg by mouth daily.     Yes Historical Provider, MD  lisinopril-hydrochlorothiazide (PRINZIDE,ZESTORETIC) 20-25 MG per tablet Take 1 tablet by mouth daily. 02/03/13   Collene Gobble, MD  mupirocin ointment  (BACTROBAN) 2 % Apply topically 3 (three) times daily. 08/04/13   Collene Gobble, MD  nitroGLYCERIN (NITROSTAT) 0.4 MG SL tablet Place 0.4 mg under the tongue every 5 (five) minutes as needed.      Historical Provider, MD   History   Social History  . Marital Status: Married    Spouse Name: Lorene Dy    Number of Children: 1  . Years of Education: College   Occupational History  .     Social History Main Topics  . Smoking status: Never Smoker   . Smokeless tobacco: Never Used  . Alcohol Use: Yes     Comment: very seldom  . Drug Use: No  . Sexual Activity: Yes    Birth Control/ Protection: None     Comment: number sex partners in the last 12 months  1   Other Topics Concern  . Not on file   Social History Narrative   Patient is married Lorene Dy) and lives at home with his wife and one child.   Patient is working full-time.   Patient has a college education.   Patient is right-handed.   Patient drinks two cups of coffee daily.   Exercise 2 times a week  On treadmill      Review of Systems  HENT: Negative for congestion, ear pain, sore throat and trouble swallowing.   Eyes: Positive for  pain, discharge (tearing) and redness. Negative for photophobia, itching and visual disturbance.       Foreign body in left eye  Respiratory: Negative for cough and shortness of breath.        Objective:   Physical Exam  Nursing note and vitals reviewed. Constitutional: He is oriented to person, place, and time. He appears well-developed and well-nourished.  HENT:  Head: Normocephalic and atraumatic.  Eyes: EOM are normal. Pupils are equal, round, and reactive to light. Left eye exhibits no discharge, no exudate and no hordeolum. Foreign body present in the left eye. Right conjunctiva is not injected. Right conjunctiva has no hemorrhage. Left conjunctiva is injected. Left conjunctiva has no hemorrhage.  Slit lamp exam:      The left eye shows foreign body and fluorescein uptake.  Left  eye with diffuse erythema to conjunctiva, especially laterally. Small foreign body to left lower region. 1 mm diameter black foreign body.  Cardiovascular: Normal rate.   Pulmonary/Chest: Effort normal.  Abdominal: He exhibits no distension.  Neurological: He is alert and oriented to person, place, and time.  Skin: Skin is warm and dry.  Psychiatric: He has a normal mood and affect.    Procedure Note:  25 gauge sterile needle removed foreign body from conjunctiva. No residual remaining . I irrigated with sterile water. Pt tolerated the procedure well. Fluorescein applied with local uptake at area of previous foreign body.      Assessment & Plan:   1. Pain, eye, left   2. Foreign body of left eye    1. Pain L eye:  New. Secondary to FB. 2. Foreign body L eye:  New.  S/p removal of FB in office; Tetanus UTD.  Rx for Ocuflox ophthalmic drops every two hours; follow-up in 24-48 hours.    Meds ordered this encounter  Medications  . DISCONTD: ofloxacin (OCUFLOX) 0.3 % ophthalmic solution    Sig: Place 2 drops into the left eye every 2 (two) hours.    Dispense:  10 mL    Refill:  0   I personally performed the services described in this documentation, which was scribed in my presence.  The recorded information has been reviewed and is accurate.  Nilda Simmer, M.D.  [facility] [address] [place], [place]  [address] [phone] phone [phone] fax

## 2013-10-01 ENCOUNTER — Telehealth: Payer: Self-pay

## 2013-10-01 NOTE — Telephone Encounter (Signed)
Patient came in for recheck for foreign body in eye. He did not want to wait to be checked in, he thought he would just go to the back and be out in 2 minutes. Was very upset that he would have to wait since he had to go to work.

## 2013-10-01 NOTE — Telephone Encounter (Signed)
Dr. Smith - FYI

## 2013-10-02 NOTE — Telephone Encounter (Signed)
Left a message for patient to return call.

## 2013-10-02 NOTE — Telephone Encounter (Signed)
Please call patient --- 1. I highly recommend reevaluation of eye to confirm healing of cornea.  2. How is his eye feeling?  Any persistent foreign body sensation?  Any persistent or worsening redness?

## 2013-10-06 NOTE — Telephone Encounter (Signed)
Called him, he states his eye is fine,"he can see clearly now, the rain is gone"  To you FYI

## 2014-02-02 ENCOUNTER — Ambulatory Visit (INDEPENDENT_AMBULATORY_CARE_PROVIDER_SITE_OTHER): Payer: BC Managed Care – PPO | Admitting: Emergency Medicine

## 2014-02-02 ENCOUNTER — Encounter: Payer: Self-pay | Admitting: Emergency Medicine

## 2014-02-02 VITALS — BP 130/82 | HR 73 | Temp 97.9°F | Resp 16 | Ht 67.0 in | Wt 283.8 lb

## 2014-02-02 DIAGNOSIS — R7309 Other abnormal glucose: Secondary | ICD-10-CM

## 2014-02-02 DIAGNOSIS — E781 Pure hyperglyceridemia: Secondary | ICD-10-CM

## 2014-02-02 DIAGNOSIS — I10 Essential (primary) hypertension: Secondary | ICD-10-CM

## 2014-02-02 DIAGNOSIS — R739 Hyperglycemia, unspecified: Secondary | ICD-10-CM

## 2014-02-02 DIAGNOSIS — G4733 Obstructive sleep apnea (adult) (pediatric): Secondary | ICD-10-CM

## 2014-02-02 LAB — CBC WITH DIFFERENTIAL/PLATELET
BASOS ABS: 0 10*3/uL (ref 0.0–0.1)
Basophils Relative: 0 % (ref 0–1)
EOS PCT: 4 % (ref 0–5)
Eosinophils Absolute: 0.2 10*3/uL (ref 0.0–0.7)
HCT: 46.3 % (ref 39.0–52.0)
Hemoglobin: 16 g/dL (ref 13.0–17.0)
LYMPHS ABS: 1.5 10*3/uL (ref 0.7–4.0)
LYMPHS PCT: 28 % (ref 12–46)
MCH: 31.8 pg (ref 26.0–34.0)
MCHC: 34.6 g/dL (ref 30.0–36.0)
MCV: 92 fL (ref 78.0–100.0)
Monocytes Absolute: 0.3 10*3/uL (ref 0.1–1.0)
Monocytes Relative: 6 % (ref 3–12)
Neutro Abs: 3.3 10*3/uL (ref 1.7–7.7)
Neutrophils Relative %: 62 % (ref 43–77)
PLATELETS: 182 10*3/uL (ref 150–400)
RBC: 5.03 MIL/uL (ref 4.22–5.81)
RDW: 13.4 % (ref 11.5–15.5)
WBC: 5.4 10*3/uL (ref 4.0–10.5)

## 2014-02-02 LAB — LIPID PANEL
CHOL/HDL RATIO: 5.5 ratio
Cholesterol: 165 mg/dL (ref 0–200)
HDL: 30 mg/dL — ABNORMAL LOW (ref >39)
LDL CALC: 75 mg/dL (ref 0–99)
Triglycerides: 298 mg/dL — ABNORMAL HIGH (ref <150)
VLDL: 60 mg/dL — ABNORMAL HIGH (ref 0–40)

## 2014-02-02 LAB — COMPLETE METABOLIC PANEL WITH GFR
ALK PHOS: 51 U/L (ref 39–117)
ALT: 44 U/L (ref 0–53)
AST: 28 U/L (ref 0–37)
Albumin: 4.4 g/dL (ref 3.5–5.2)
BUN: 22 mg/dL (ref 6–23)
CALCIUM: 8.9 mg/dL (ref 8.4–10.5)
CO2: 30 mEq/L (ref 19–32)
CREATININE: 0.92 mg/dL (ref 0.50–1.35)
Chloride: 99 mEq/L (ref 96–112)
GFR, Est African American: >89 mL/min
GLUCOSE: 104 mg/dL — AB (ref 70–99)
Potassium: 4.3 mEq/L (ref 3.5–5.3)
Sodium: 137 mEq/L (ref 135–145)
Total Bilirubin: 0.9 mg/dL (ref 0.2–1.2)
Total Protein: 6.8 g/dL (ref 6.0–8.3)

## 2014-02-02 LAB — POCT GLYCOSYLATED HEMOGLOBIN (HGB A1C): Hemoglobin A1C: 5.5

## 2014-02-02 LAB — GLUCOSE, POCT (MANUAL RESULT ENTRY): POC Glucose: 100 mg/dl — AB (ref 70–99)

## 2014-02-02 MED ORDER — LISINOPRIL-HYDROCHLOROTHIAZIDE 10-12.5 MG PO TABS: ORAL_TABLET | ORAL | Status: DC

## 2014-02-02 NOTE — Progress Notes (Signed)
   Subjective:    Patient ID: Andrew [name], male    DOB: 06/17/1966, 48 y.o.   MRN: [id number]  HPI patient in to followup hypertension. He has a cardiologist he sees regularly because of his episode of chest pain. Previously he did not have his sleep study done for financial reasons. He now showed to have this done. Apparently he is bald a CPAP machine and has been using it but has no idea the settings to use.    Review of Systems     Objective:   Physical Exam HEENT exam is unremarkable. Neck supple. Chest was clear to auscultation and percussion. Cardiac exam is regular rate and rhythm without murmurs rubs or gallops. The abdomen was obese without tenderness. Extremities are without edema .        Assessment & Plan:  Patient is doing well. He has a follow up visit with his cardiologist. Burnis Medin see him back in 6 months for a physical. I have rescheduled him for a sleep study

## 2014-03-02 ENCOUNTER — Ambulatory Visit (INDEPENDENT_AMBULATORY_CARE_PROVIDER_SITE_OTHER): Payer: BC Managed Care – PPO | Admitting: Neurology

## 2014-03-02 ENCOUNTER — Encounter: Payer: Self-pay | Admitting: Neurology

## 2014-03-02 VITALS — BP 128/77 | HR 78 | Resp 17 | Ht 68.25 in | Wt 287.0 lb

## 2014-03-02 DIAGNOSIS — R0609 Other forms of dyspnea: Secondary | ICD-10-CM

## 2014-03-02 DIAGNOSIS — G4730 Sleep apnea, unspecified: Secondary | ICD-10-CM

## 2014-03-02 DIAGNOSIS — R0989 Other specified symptoms and signs involving the circulatory and respiratory systems: Secondary | ICD-10-CM

## 2014-03-02 DIAGNOSIS — R0683 Snoring: Secondary | ICD-10-CM | POA: Insufficient documentation

## 2014-03-02 NOTE — Patient Instructions (Signed)
Sleep Apnea   Sleep apnea is a sleep disorder characterized by abnormal pauses in breathing while you sleep. When your breathing pauses, the level of oxygen in your blood decreases. This causes you to move out of deep sleep and into light sleep. As a result, your quality of sleep is poor, and the system that carries your blood throughout your body (cardiovascular system) experiences stress. If sleep apnea remains untreated, the following conditions can develop:  · High blood pressure (hypertension).  · Coronary artery disease.  · Inability to achieve or maintain an erection (impotence).  · Impairment of your thought process (cognitive dysfunction).  There are three types of sleep apnea:  1. Obstructive sleep apnea Pauses in breathing during sleep because of a blocked airway.  2. Central sleep apnea Pauses in breathing during sleep because the area of the brain that controls your breathing does not send the correct signals to the muscles that control breathing.  3. Mixed sleep apnea A combination of both obstructive and central sleep apnea.  RISK FACTORS  The following risk factors can increase your risk of developing sleep apnea:  · Being overweight.  · Smoking.  · Having narrow passages in your nose and throat.  · Being of older age.  · Being male.  · Alcohol use.  · Sedative and tranquilizer use.  · Ethnicity. Among individuals younger than 35 years, African Americans are at increased risk of sleep apnea.  SYMPTOMS   · Difficulty staying asleep.  · Daytime sleepiness and fatigue.  · Loss of energy.  · Irritability.  · Loud, heavy snoring.  · Morning headaches.  · Trouble concentrating.  · Forgetfulness.  · Decreased interest in sex.  DIAGNOSIS   In order to diagnose sleep apnea, your caregiver will perform a physical examination. Your caregiver may suggest that you take a home sleep test. Your caregiver may also recommend that you spend the night in a sleep lab. In the sleep lab, several monitors record  information about your heart, lungs, and brain while you sleep. Your leg and arm movements and blood oxygen level are also recorded.  TREATMENT  The following actions may help to resolve mild sleep apnea:  · Sleeping on your side.    · Using a decongestant if you have nasal congestion.    · Avoiding the use of depressants, including alcohol, sedatives, and narcotics.    · Losing weight and modifying your diet if you are overweight.  There also are devices and treatments to help open your airway:  · Oral appliances. These are custom-made mouthpieces that shift your lower jaw forward and slightly open your bite. This opens your airway.  · Devices that create positive airway pressure. This positive pressure "splints" your airway open to help you breathe better during sleep. The following devices create positive airway pressure:  · Continuous positive airway pressure (CPAP) device. The CPAP device creates a continuous level of air pressure with an air pump. The air is delivered to your airway through a mask while you sleep. This continuous pressure keeps your airway open.  · Nasal expiratory positive airway pressure (EPAP) device. The EPAP device creates positive air pressure as you exhale. The device consists of single-use valves, which are inserted into each nostril and held in place by adhesive. The valves create very little resistance when you inhale but create much more resistance when you exhale. That increased resistance creates the positive airway pressure. This positive pressure while you exhale keeps your airway open, making it easier   continuous air pressure through a mask. However, with the BPAP machine, the pressure is set at two different levels. The pressure when you  exhale is lower than the pressure when you inhale.  Surgery. Typically, surgery is only done if you cannot comply with less invasive treatments or if the less invasive treatments do not improve your condition. Surgery involves removing excess tissue in your airway to create a wider passage way. Document Released: 10/05/2002 Document Revised: 02/09/2013 Document Reviewed: 02/21/2012 [facility] Patient Information 2014 [place]. Exercise to Lose Weight Exercise and a healthy diet may help you lose weight. Your doctor may suggest specific exercises. EXERCISE IDEAS AND TIPS  Choose low-cost things you enjoy doing, such as walking, bicycling, or exercising to workout videos.  Take stairs instead of the elevator.  Walk during your lunch break.  Park your car further away from work or school.  Go to a gym or an exercise class.  Start with 5 to 10 minutes of exercise each day. Build up to 30 minutes of exercise 4 to 6 days a week.  Wear shoes with good support and comfortable clothes.  Stretch before and after working out.  Work out until you breathe harder and your heart beats faster.  Drink extra water when you exercise.  Do not do so much that you hurt yourself, feel dizzy, or get very short of breath. Exercises that burn about 150 calories:  Running 1  miles in 15 minutes.  Playing volleyball for 45 to 60 minutes.  Washing and waxing a car for 45 to 60 minutes.  Playing touch football for 45 minutes.  Walking 1  miles in 35 minutes.  Pushing a stroller 1  miles in 30 minutes.  Playing basketball for 30 minutes.  Raking leaves for 30 minutes.  Bicycling 5 miles in 30 minutes.  Walking 2 miles in 30 minutes.  Dancing for 30 minutes.  Shoveling snow for 15 minutes.  Swimming laps for 20 minutes.  Walking up stairs for 15 minutes.  Bicycling 4 miles in 15 minutes.  Gardening for 30 to 45 minutes.  Jumping rope for 15 minutes.  Washing windows or  floors for 45 to 60 minutes. Document Released: 11/17/2010 Document Revised: 01/07/2012 Document Reviewed: 11/17/2010 [facility] Patient Information 2014 [place], [place].

## 2014-03-02 NOTE — Progress Notes (Signed)
[facility]  Provider:  Larey Seat, [place] D  Referring Provider: Darlyne Russian, MD Primary Care Physician:  Jenny Reichmann, MD  Chief Complaint  Patient presents with  . New Evaluation    Room 11  . Sleep consult    HPI:  Andrew [name] is a 48 y.o. caucasian, right handed, married  male, who  is seen here as a referral  from Dr. Everlene Farrier for a sleep evaluation.   Dr. Horton Chin has followed this patient for many years. He had an episode of chest pain and was evaluated by cardiology. Given his family history of  cardiopulmonary disease , he was also asked to undergo a sleep study. However, due to financial reasons he was not able to do it at the time it was originally ordered. He was concerned about the costs. Dr Everlene Farrier was testing him for fatigue and sleepiness, and found to have hypo-testosteroemia. He could not tolerate the testosterone supplements due to severe itching.   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 He is on call frequently, on average 7-8 hours of sleep.  No nocturia, no morning headaches, but a dry mouth .   He has breakfast at home, he drinks 2 coffees, no iced tea, rarely soda. Drinks a lot of water . No exercise routine.  Never been a smoker, social drinker- 6 drinks a month would be more than average.   He rarely naps, once a month. His wife noted apnea and snoring, he sleeps with one pillow .  His wife got him a refurbished CPAP that he uses , so she can sleep without being interrupted by his snoring. It was never set , and may just need to be placed on auto-titration.  He uses a nasal mask, but ha facial hair ,which wouldn't allow for a good seal.   His bedroom is cool, quiet, dark. He shares the bed room with his wife. No pets , daughter is 63 and lives at  home.     His sister and mother had OSA and were on CPAP.     Review of Systems: Out of a complete 14 system review, the patient complains of only the following symptoms, and all other reviewed systems are negative. I was able to review of the patient's laboratory results from 3 weeks ago he has a slightly elevated blood sugar on his last exam however this may have not been drawn and you had fasted. He also has normal liver function tests and kidney values and normal electrolytes.   Review of systems was not endorsed for anything else. Epworth Sleepiness Scale was 3 points, the fatigue severity score of 15 points.03-02-14  History   Social History  . Marital Status: Married    Spouse Name: [name]    Number of Children: 1  . Years of Education: College   Occupational History  .     Social History Main Topics  . Smoking status: Never Smoker   . Smokeless tobacco: Never Used  . Alcohol Use: Yes     Comment: very seldom  . Drug Use: No  . Sexual Activity: Yes    Birth Control/ Protection: None     Comment: number sex partners in the last 36 months  1   Other Topics  Concern  . Not on file   Social History Narrative   [name]) and lives at home with his wife and one child.   Patient is working full-time.   Patient has a college education.   Patient is right-handed.   Patient drinks two cups of coffee daily.   Exercise 2 times a week  On treadmill    Family History  Problem Relation Age of Onset  . Heart attack Mother   . COPD Mother   . Heart attack Father     Past Medical History  Diagnosis Date  . Hyperlipidemia   . Hypertension   . GERD (gastroesophageal reflux disease)   . Obesity (BMI 35.0-39.9 without comorbidity)     Past Surgical History  Procedure Laterality Date  . Wisdom teeth removal    . Hernia repair      when baby    Current Outpatient Prescriptions  Medication Sig Dispense Refill  . aspirin 81 MG tablet Take 81 mg  by mouth daily.        . Cholecalciferol (VITAMIN D3) 2000 UNITS TABS Take by mouth.        . fish oil-omega-3 fatty acids 1000 MG capsule Take 2 g by mouth daily.      Marland Kitchen lisinopril-hydrochlorothiazide (PRINZIDE,ZESTORETIC) 10-12.5 MG per tablet TAKE 1 TABLET BY MOUTH DAILY.  90 tablet  3  . mupirocin ointment (BACTROBAN) 2 % Apply topically 3 (three) times daily.  22 g  0  . ofloxacin (OCUFLOX) 0.3 % ophthalmic solution Place 2 drops into the left eye every 2 (two) hours.  10 mL  0   No current facility-administered medications for this visit.    Allergies as of 03/02/2014 - Review Complete 03/02/2014  Allergen Reaction Noted  . Testosterone Rash 02/03/2013    Vitals: BP 128/77  Pulse 78  Resp 17  Ht 5' 8.25" (1.734 m)  Wt 287 lb (130.182 kg)  BMI 43.30 kg/m2 Last Weight:  Wt Readings from Last 1 Encounters:  03/02/14 287 lb (130.182 kg)   Last Height:   Ht Readings from Last 1 Encounters:  03/02/14 5' 8.25" (1.734 m)    Physical exam:  General: The patient is awake, alert and appears not in acute distress. The patient is well groomed. Head: Normocephalic, atraumatic. Neck is supple. Mallampati 4, neck circumference:22 inches , TMJ , no jaw lock. No retrognathia but crowded airway.  Cardiovascular:  Regular rate and rhythm , without  murmurs or carotid bruit, and without distended neck veins. Respiratory: Lungs are clear to auscultation. Skin:  Without evidence of edema, or rash Trunk: BMI is severely  elevated and patient  has normal posture.  Neurologic exam : The patient is awake and alert, oriented to place and time.  Memory subjective described as intact.  There is a normal attention span & concentration ability. Speech is fluent without  dysarthria, dysphonia or aphasia. Mood and affect are appropriate.  Cranial nerves: Pupils are equal and briskly reactive to light. Funduscopic exam without  evidence of pallor or edema.  Extraocular movements  in vertical and  horizontal planes intact and without nystagmus. Visual fields by finger perimetry are intact. Hearing to finger rub intact. Facial sensation intact to fine touch. Facial motor strength is symmetric and tongue and uvula move midline.  Motor exam:  Normal tone and normal muscle bulk and symmetric normal strength in all extremities.  Sensory:  Fine touch, pinprick and vibration were tested in all extremities. Proprioception is  normal.  Coordination: Rapid alternating movements in the fingers/hands is tested and normal.  Finger-to-nose maneuver tested and normal without evidence of ataxia, dysmetria or tremor.  Gait and station: Patient walks without assistive device . Deep tendon reflexes: in the  upper and lower extremities are symmetric and intact.    Assessment:  After physical and neurologic examination, review of laboratory studies, imaging, neurophysiology testing and pre-existing records, assessment is  1) patient with multiple risk factors for OSA, by BMI and neck size, and by history of snoring and witnessed apnea.   Plan:  Treatment plan and additional workup : 1) patient is with [employer] , 3% score and AHI SPLIT at 15. Co2 needed.  2)

## 2014-03-12 ENCOUNTER — Ambulatory Visit (INDEPENDENT_AMBULATORY_CARE_PROVIDER_SITE_OTHER): Payer: BC Managed Care – PPO | Admitting: Cardiovascular Disease

## 2014-03-12 ENCOUNTER — Encounter: Payer: Self-pay | Admitting: Cardiovascular Disease

## 2014-03-12 VITALS — BP 120/82 | HR 61 | Ht 67.0 in | Wt 280.0 lb

## 2014-03-12 DIAGNOSIS — I10 Essential (primary) hypertension: Secondary | ICD-10-CM

## 2014-03-12 DIAGNOSIS — R079 Chest pain, unspecified: Secondary | ICD-10-CM

## 2014-03-12 NOTE — Patient Instructions (Signed)
Your physician wants you to follow-up in:  12 months.  You will receive a reminder letter in the mail two months in advance. If you don't receive a letter, please call our office to schedule the follow-up appointment.   

## 2014-03-12 NOTE — Progress Notes (Signed)
History of Present Illness: 48 yo WM with history of HTN who is here today for cardiac follow up. I saw him in June 2012 to establish cardiology care. He reported feeling "bad" for one week. Both parents had premature CAD. He had no energy but constant weakness and constant chest pressure.  Admitted to [facility] 04/13/11 with chest pain. Stress myoview with no ischemia. LVEF normal. His symptoms improved shortly after discharge. He was last seen here May 2014.  He is here today for follow up. He tells me that he has been feeling well. He has had no exertional chest pain or pressure.   Primary Care Physician: Arlyss Queen  Last Lipid Profile:Lipid Panel     Component Value Date/Time   CHOL 165 02/02/2014 0815   TRIG 298* 02/02/2014 0815   HDL 30* 02/02/2014 0815   CHOLHDL 5.5 02/02/2014 0815   VLDL 60* 02/02/2014 0815   [place] 75 02/02/2014 0815    Past Medical History  Diagnosis Date  . Hyperlipidemia   . Hypertension   . GERD (gastroesophageal reflux disease)   . Obesity (BMI 35.0-39.9 without comorbidity)     Past Surgical History  Procedure Laterality Date  . Wisdom teeth removal    . Hernia repair      when baby    Current Outpatient Prescriptions  Medication Sig Dispense Refill  . aspirin 81 MG tablet Take 81 mg by mouth daily.        . Cholecalciferol (VITAMIN D3) 2000 UNITS TABS Take by mouth.        . fish oil-omega-3 fatty acids 1000 MG capsule Take 2 g by mouth daily.      Marland Kitchen lisinopril-hydrochlorothiazide (PRINZIDE,ZESTORETIC) 10-12.5 MG per tablet TAKE 1 TABLET BY MOUTH DAILY.  90 tablet  3   No current facility-administered medications for this visit.    Allergies  Allergen Reactions  . Testosterone Rash    SEVERE WITH ITCHING    History   Social History  . Marital Status: Married    Spouse Name: [name]    Number of Children: 1  . Years of Education: College   Occupational History  .     Social History Main Topics  . Smoking status: Never  Smoker   . Smokeless tobacco: Never Used  . Alcohol Use: Yes     Comment: very seldom  . Drug Use: No  . Sexual Activity: Yes    Birth Control/ Protection: None     Comment: number sex partners in the last 85 months  1   Other Topics Concern  . Not on file   Social History Narrative   [name]) and lives at home with his wife and one child.   Patient is working full-time.   Patient has a college education.   Patient is right-handed.   Patient drinks two cups of coffee daily.   Exercise 2 times a week  On treadmill    Family History  Problem Relation Age of Onset  . Heart attack Mother   . COPD Mother   . Heart attack Father     Review of Systems:  As stated in the HPI and otherwise negative.   BP 120/82  Pulse 61  Ht 5\' 7"  (1.702 m)  Wt 280 lb (127.007 kg)  BMI 43.84 kg/m2  Physical Examination: General: Well developed, well nourished, NAD HEENT: OP clear, mucus membranes moist SKIN: warm, dry. No rashes. Neuro: No focal deficits Musculoskeletal: Muscle strength  5/5 all ext Psychiatric: Mood and affect normal Neck: No JVD, no carotid bruits, no thyromegaly, no lymphadenopathy. Lungs:Clear bilaterally, no wheezes, rhonci, crackles Cardiovascular: Regular rate and rhythm. No murmurs, gallops or rubs. Abdomen:Soft. Bowel sounds present. Non-tender.  Extremities: No lower extremity edema. Pulses are 2 + in the bilateral DP/PT.   Assessment and Plan:   1. Chest pain: Resolved. Atypical. No exertional chest pressure or SOB. His pain was likely GERD related.  Given his family history of CAD, he would like to keep following here. Will arrange exercise stress test next year.   2. HTN: BP controlled. No changes.

## 2014-03-26 ENCOUNTER — Ambulatory Visit: Payer: BC Managed Care – PPO | Admitting: Cardiovascular Disease

## 2014-08-05 ENCOUNTER — Encounter: Payer: BC Managed Care – PPO | Admitting: Emergency Medicine

## 2014-10-19 ENCOUNTER — Encounter: Payer: BC Managed Care – PPO | Admitting: Emergency Medicine

## 2014-11-04 ENCOUNTER — Other Ambulatory Visit: Payer: Self-pay | Admitting: Emergency Medicine

## 2014-11-04 ENCOUNTER — Ambulatory Visit (INDEPENDENT_AMBULATORY_CARE_PROVIDER_SITE_OTHER): Payer: 59 | Admitting: Emergency Medicine

## 2014-11-04 ENCOUNTER — Encounter: Payer: Self-pay | Admitting: Emergency Medicine

## 2014-11-04 ENCOUNTER — Ambulatory Visit (INDEPENDENT_AMBULATORY_CARE_PROVIDER_SITE_OTHER): Payer: 59

## 2014-11-04 VITALS — BP 133/84 | HR 96 | Temp 98.4°F | Resp 18 | Ht 66.75 in | Wt 274.8 lb

## 2014-11-04 DIAGNOSIS — I10 Essential (primary) hypertension: Secondary | ICD-10-CM

## 2014-11-04 DIAGNOSIS — Z0000 Encounter for general adult medical examination without abnormal findings: Secondary | ICD-10-CM

## 2014-11-04 DIAGNOSIS — Z13220 Encounter for screening for lipoid disorders: Secondary | ICD-10-CM

## 2014-11-04 DIAGNOSIS — Z125 Encounter for screening for malignant neoplasm of prostate: Secondary | ICD-10-CM

## 2014-11-04 LAB — CBC WITH DIFFERENTIAL/PLATELET
BASOS PCT: 0 % (ref 0–1)
Basophils Absolute: 0 10*3/uL (ref 0.0–0.1)
EOS ABS: 0.1 10*3/uL (ref 0.0–0.7)
EOS PCT: 2 % (ref 0–5)
HCT: 48.2 % (ref 39.0–52.0)
Hemoglobin: 16.4 g/dL (ref 13.0–17.0)
Lymphocytes Relative: 28 % (ref 12–46)
Lymphs Abs: 1.5 10*3/uL (ref 0.7–4.0)
MCH: 31.5 pg (ref 26.0–34.0)
MCHC: 34 g/dL (ref 30.0–36.0)
MCV: 92.5 fL (ref 78.0–100.0)
MONO ABS: 0.3 10*3/uL (ref 0.1–1.0)
MPV: 9.5 fL (ref 8.6–12.4)
Monocytes Relative: 6 % (ref 3–12)
NEUTROS ABS: 3.4 10*3/uL (ref 1.7–7.7)
Neutrophils Relative %: 64 % (ref 43–77)
PLATELETS: 190 10*3/uL (ref 150–400)
RBC: 5.21 MIL/uL (ref 4.22–5.81)
RDW: 13.5 % (ref 11.5–15.5)
WBC: 5.3 10*3/uL (ref 4.0–10.5)

## 2014-11-04 LAB — LIPID PANEL
CHOL/HDL RATIO: 5.5 ratio
CHOLESTEROL: 172 mg/dL (ref 0–200)
HDL: 31 mg/dL — ABNORMAL LOW (ref >39)
LDL Cholesterol: 97 mg/dL (ref 0–99)
Triglycerides: 222 mg/dL — ABNORMAL HIGH (ref <150)
VLDL: 44 mg/dL — ABNORMAL HIGH (ref 0–40)

## 2014-11-04 LAB — COMPLETE METABOLIC PANEL WITH GFR
ALK PHOS: 55 U/L (ref 39–117)
ALT: 41 U/L (ref 0–53)
AST: 25 U/L (ref 0–37)
Albumin: 4.5 g/dL (ref 3.5–5.2)
BUN: 20 mg/dL (ref 6–23)
CO2: 30 mEq/L (ref 19–32)
Calcium: 9.5 mg/dL (ref 8.4–10.5)
Chloride: 99 mEq/L (ref 96–112)
Creat: 0.97 mg/dL (ref 0.50–1.35)
GFR, Est African American: >89 mL/min
GLUCOSE: 100 mg/dL — AB (ref 70–99)
Potassium: 4.4 mEq/L (ref 3.5–5.3)
Sodium: 138 mEq/L (ref 135–145)
TOTAL PROTEIN: 7.3 g/dL (ref 6.0–8.3)
Total Bilirubin: 1.1 mg/dL (ref 0.2–1.2)

## 2014-11-04 LAB — POCT UA - MICROSCOPIC ONLY
Casts, Ur, LPF, POC: NEGATIVE
Crystals, Ur, HPF, POC: NEGATIVE
Epithelial cells, urine per micros: NEGATIVE
Mucus, UA: NEGATIVE
WBC, UR, HPF, POC: NEGATIVE
Yeast, UA: NEGATIVE

## 2014-11-04 LAB — POCT URINALYSIS DIPSTICK
Bilirubin, UA: NEGATIVE
Blood, UA: NEGATIVE
GLUCOSE UA: NEGATIVE
KETONES UA: NEGATIVE
Leukocytes, UA: NEGATIVE
Nitrite, UA: NEGATIVE
PH UA: 7
Protein, UA: NEGATIVE
Spec Grav, UA: 1.015
Urobilinogen, UA: 0.2

## 2014-11-04 LAB — TSH: TSH: 1.115 u[IU]/mL (ref 0.350–4.500)

## 2014-11-04 MED ORDER — FLUTICASONE PROPIONATE 50 MCG/ACT NA SUSP: 2.0000 | Freq: Every day | NASAL | Status: DC

## 2014-11-04 NOTE — Progress Notes (Addendum)
Subjective:  This chart was scribed for Andrew Russian, MD by Lowella Petties, ED Scribe. The patient was seen in room 21. Patient's care was started at 9:42 AM.  Patient ID: Andrew [name], male    DOB: 08-07-1966, 49 y.o.   MRN: [id number]  HPI  HPI Comments: BRAYSEN [name] is a 49 y.o. male who presents to the [facility] for a physical examination. He was admitted to [facility] for chest pain in 2012. He recently saw his cardiologist, and he will have a stress test this year. He states that he was scheduled to have a sleep apnea study, but he did not go through with it due to the cost in addition to insurance. He is agreeable to following up on this when he is able. He states that his sinuses are clogged, and he is experiencing burning, left ear pain today. He is requesting a CXR today due to occupational concerns. He has not had a flu shot this year. His Tetanus is UTD. He states that he did not do any traveling over the holiday. He has a 72 year old daughter.    Review of Systems  Constitutional: Negative for fever, chills, activity change and appetite change.  HENT: Positive for congestion, ear pain and sinus pressure.   Respiratory: Negative for chest tightness and shortness of breath.   Cardiovascular: Negative for chest pain and palpitations.  Gastrointestinal: Negative for nausea, vomiting, abdominal pain, diarrhea, constipation and abdominal distention.  Genitourinary: Negative for dysuria, urgency, hematuria and difficulty urinating.  Musculoskeletal: Negative for back pain, arthralgias and neck pain.  Skin: Negative for rash and wound.  Neurological: Negative for dizziness, weakness, numbness and headaches.   Objective:  Physical Exam  CONSTITUTIONAL: Well developed/well nourished HEAD: Normocephalic/atraumatic EYES: EOMI/PERRL ENMT: Mucous membranes moist NECK: supple no meningeal signs SPINE/BACK:entire spine nontender CV: S1/S2 noted, no  murmurs/rubs/gallops noted LUNGS: Lungs are clear to auscultation bilaterally, no apparent distress ABDOMEN: soft, nontender, no rebound or guarding, bowel sounds noted throughout abdomen GU:no cva tenderness NEURO: Pt is awake/alert/appropriate, moves all extremitiesx4.  No facial droop.   EXTREMITIES: pulses normal/equal, full ROM SKIN: warm, color normal PSYCH: no abnormalities of mood noted, alert and oriented to situation Results for orders placed or performed in visit on 11/04/14  POCT UA - Microscopic Only  Result Value Ref Range   WBC, Ur, HPF, POC neg    RBC, urine, microscopic 0-2    Bacteria, U Microscopic trace    Mucus, UA neg    Epithelial cells, urine per micros neg    Crystals, Ur, HPF, POC neg    Casts, Ur, LPF, POC neg    Yeast, UA neg   POCT urinalysis dipstick  Result Value Ref Range   Color, UA yellow    Clarity, UA clear    Glucose, UA neg    Bilirubin, UA neg    Ketones, UA neg    Spec Grav, UA 1.015    Blood, UA neg    pH, UA 7.0    Protein, UA neg    Urobilinogen, UA 0.2    Nitrite, UA neg    Leukocytes, UA Negative   [facility] reading (PRIMARY) by  Dr. Everlene Farrier no acute disease  Assessment & Plan:  I advised patient trying go ahead and get his sleep study done. He has not been able to do it for financial reasons. Vital signs remained stable he seems to be doing well. Routine labs  were done today.I personally performed the services described in this documentation, which was scribed in my presence. The recorded information has been reviewed and is accurate.

## 2014-11-04 NOTE — Progress Notes (Signed)
   Subjective:    Patient ID: Andrew [name], male    DOB: November 18, 1965, 49 y.o.   MRN: [id number]  HPI    Review of Systems  Constitutional: Negative.   HENT: Negative.   Eyes: Negative.   Respiratory: Negative.   Cardiovascular: Negative.   Gastrointestinal: Negative.   Endocrine: Negative.   Genitourinary: Negative.   Musculoskeletal: Negative.   Skin: Negative.   Allergic/Immunologic: Positive for environmental allergies.  Neurological: Negative.   Hematological: Negative.   Psychiatric/Behavioral: Negative.        Objective:   Physical Exam        Assessment & Plan:

## 2014-11-04 NOTE — Progress Notes (Deleted)
   Subjective:    Patient ID: Andrew [name], male    DOB: 1966-01-03, 49 y.o.   MRN: [id number]  HPI    Review of Systems     Objective:   Physical Exam        Assessment & Plan:

## 2014-11-05 ENCOUNTER — Telehealth: Payer: Self-pay | Admitting: *Deleted

## 2014-11-05 ENCOUNTER — Telehealth: Payer: Self-pay

## 2014-11-05 LAB — PSA: PSA: 0.93 ng/mL (ref <=4.00)

## 2014-11-05 NOTE — Telephone Encounter (Signed)
Dr. Everlene Farrier Andrew [name] called he was seen on 11/04/14, and forgot to tell you about a tick bite he had.  Pt states he had a rash around it for 3 weeks it finally went away, but then another rash appeared on his shoulder.He wants a lime disease can be added on to the blood work he already had, or if he would need to come in for this test.

## 2014-11-05 NOTE — Telephone Encounter (Signed)
LM for rtn call. 

## 2014-11-05 NOTE — Telephone Encounter (Signed)
Patient was seen by Dr. Everlene Farrier,  He has more questions to ask the dr regarding his OV.   Please call [phone]

## 2014-11-06 NOTE — Telephone Encounter (Signed)
Lyme titers added at [facility].

## 2014-11-06 NOTE — Telephone Encounter (Signed)
Please add  Lyme titers to blood at the lab

## 2014-11-07 ENCOUNTER — Encounter: Payer: Self-pay | Admitting: Radiology

## 2014-11-08 LAB — B. BURGDORFI ANTIBODIES: B BURGDORFERI AB IGG+ IGM: 0.45 {ISR}

## 2015-02-07 ENCOUNTER — Other Ambulatory Visit: Payer: Self-pay | Admitting: Emergency Medicine

## 2015-03-18 ENCOUNTER — Encounter: Payer: Self-pay | Admitting: Cardiovascular Disease

## 2015-03-18 ENCOUNTER — Ambulatory Visit (INDEPENDENT_AMBULATORY_CARE_PROVIDER_SITE_OTHER): Payer: 59 | Admitting: Cardiovascular Disease

## 2015-03-18 VITALS — BP 124/82 | HR 73 | Ht 67.0 in | Wt 283.4 lb

## 2015-03-18 DIAGNOSIS — R072 Precordial pain: Secondary | ICD-10-CM | POA: Diagnosis not present

## 2015-03-18 DIAGNOSIS — I10 Essential (primary) hypertension: Secondary | ICD-10-CM

## 2015-03-18 NOTE — Patient Instructions (Signed)
Medication Instructions:  Your physician recommends that you continue on your current medications as directed. Please refer to the Current Medication list given to you today.   Labwork: none  Testing/Procedures: Your physician has requested that you have an exercise tolerance test. For further information please visit [email or web address]. Please also follow instruction sheet, as given.    Follow-Up: Your physician wants you to follow-up in: 12 months.  You will receive a reminder letter in the mail two months in advance. If you don't receive a letter, please call our office to schedule the follow-up appointment.

## 2015-03-18 NOTE — Progress Notes (Signed)
No chief complaint on file. follow up chest pain  History of Present Illness: 49 yo WM with history of HTN who is here today for cardiac follow up. I saw him in June 2012 to establish cardiology care. He reported feeling "bad" for one week. Both parents had premature CAD. He had no energy but constant weakness and constant chest pressure.  Admitted to [facility] 04/13/11 with chest pain. Stress myoview with no ischemia. LVEF normal. His symptoms improved shortly after discharge. He was last seen here May 2015.  He is here today for follow up. He tells me that he has been feeling well. He has had no exertional chest pain or pressure. Rare chest pains at rest.    Primary Care Physician: Arlyss Queen  Last Lipid Profile:Lipid Panel     Component Value Date/Time   CHOL 172 11/04/2014 1012   TRIG 222* 11/04/2014 1012   HDL 31* 11/04/2014 1012   CHOLHDL 5.5 11/04/2014 1012   VLDL 44* 11/04/2014 1012   [place] 97 11/04/2014 1012    Past Medical History  Diagnosis Date  . Hyperlipidemia   . Hypertension   . GERD (gastroesophageal reflux disease)   . Obesity (BMI 35.0-39.9 without comorbidity)     Past Surgical History  Procedure Laterality Date  . Wisdom teeth removal    . Hernia repair      when baby    Current Outpatient Prescriptions  Medication Sig Dispense Refill  . aspirin 81 MG tablet Take 81 mg by mouth daily.      . Cholecalciferol (VITAMIN D3) 2000 UNITS TABS Take 1 tablet by mouth daily.     . fish oil-omega-3 fatty acids 1000 MG capsule Take 2 g by mouth daily.    . fluticasone (FLONASE) 50 MCG/ACT nasal spray Place 2 sprays into both nostrils daily. 16 g 6  . lisinopril-hydrochlorothiazide (PRINZIDE,ZESTORETIC) 10-12.5 MG per tablet TAKE 1 TABLET BY MOUTH EVERY DAY. 90 tablet 1   No current facility-administered medications for this visit.    Allergies  Allergen Reactions  . Testosterone     SEVERE RASH WITH ITCHING    History   Social History  .  Marital Status: Married    Spouse Name: [name]  . Number of Children: 1  . Years of Education: College   Occupational History  .     Social History Main Topics  . Smoking status: Never Smoker   . Smokeless tobacco: Never Used  . Alcohol Use: Yes     Comment: very seldom  . Drug Use: No  . Sexual Activity: Yes    Birth Control/ Protection: None     Comment: number sex partners in the last 39 months  1   Other Topics Concern  . Not on file   Social History Narrative   [name]) and lives at home with his wife and one child.   Patient is working full-time.   Patient has a college education.   Patient is right-handed.   Patient drinks two cups of coffee daily.   Exercise 2 times a week  On treadmill    Family History  Problem Relation Age of Onset  . Heart attack Mother   . COPD Mother   . Heart disease Mother   . Hyperlipidemia Mother   . Heart attack Father   . Heart disease Father   . Hyperlipidemia Father     Review of Systems:  As stated in the HPI and otherwise negative.  BP 124/82 mmHg  Pulse 73  Ht 5\' 7"  (1.702 m)  Wt 283 lb 6.4 oz (128.549 kg)  BMI 44.38 kg/m2  Physical Examination: General: Well developed, well nourished, NAD HEENT: OP clear, mucus membranes moist SKIN: warm, dry. No rashes. Neuro: No focal deficits Musculoskeletal: Muscle strength 5/5 all ext Psychiatric: Mood and affect normal Neck: No JVD, no carotid bruits, no thyromegaly, no lymphadenopathy. Lungs:Clear bilaterally, no wheezes, rhonci, crackles Cardiovascular: Regular rate and rhythm. No murmurs, gallops or rubs. Abdomen:Soft. Bowel sounds present. Non-tender.  Extremities: No lower extremity edema. Pulses are 2 + in the bilateral DP/PT.  EKG:  EKG is ordered today. The ekg ordered today demonstrates NSR, rate 73 bpm. LPFB. Old inferior Qwaves.  Recent Labs: 11/04/2014: ALT 41; BUN 20; Creatinine 0.97; Hemoglobin 16.4; Platelets 190; Potassium 4.4;  Sodium 138; TSH 1.115   Lipid Panel    Component Value Date/Time   CHOL 172 11/04/2014 1012   TRIG 222* 11/04/2014 1012   HDL 31* 11/04/2014 1012   CHOLHDL 5.5 11/04/2014 1012   VLDL 44* 11/04/2014 1012   LDLCALC 97 11/04/2014 1012     Wt Readings from Last 3 Encounters:  03/18/15 283 lb 6.4 oz (128.549 kg)  11/04/14 274 lb 12.8 oz (124.648 kg)  03/12/14 280 lb (127.007 kg)     Other studies Reviewed: Additional studies/ records that were reviewed today include: . Review of the above records demonstrates:    Assessment and Plan:   1. Chest pain: Rare. Atypical. No exertional chest pressure or SOB. Given his family history of CAD, he would like to keep following here. Will arrange exercise stress test this summer.    2. HTN: BP controlled. No changes.   Current medicines are reviewed at length with the patient today.  The patient does not have concerns regarding medicines.  The following changes have been made:  no change  Labs/ tests ordered today include:   Orders Placed This Encounter  Procedures  . Exercise Tolerance Test  . EKG 12-Lead    Disposition:   FU with me in 12  months  Signed, Lauree Chandler, MD 03/18/2015 1:20 PM    [facility] [address], [place], [place]  [address] Phone: [phone]; Fax: [phone]

## 2015-05-19 ENCOUNTER — Ambulatory Visit (INDEPENDENT_AMBULATORY_CARE_PROVIDER_SITE_OTHER): Payer: 59 | Admitting: Emergency Medicine

## 2015-05-19 ENCOUNTER — Encounter: Payer: Self-pay | Admitting: Emergency Medicine

## 2015-05-19 VITALS — BP 135/86 | HR 69 | Temp 98.0°F | Resp 16 | Ht 67.0 in | Wt 288.0 lb

## 2015-05-19 DIAGNOSIS — I10 Essential (primary) hypertension: Secondary | ICD-10-CM

## 2015-05-19 DIAGNOSIS — Z9989 Dependence on other enabling machines and devices: Secondary | ICD-10-CM

## 2015-05-19 DIAGNOSIS — G4733 Obstructive sleep apnea (adult) (pediatric): Secondary | ICD-10-CM

## 2015-05-19 DIAGNOSIS — Z114 Encounter for screening for human immunodeficiency virus [HIV]: Secondary | ICD-10-CM | POA: Diagnosis not present

## 2015-05-19 DIAGNOSIS — E669 Obesity, unspecified: Secondary | ICD-10-CM

## 2015-05-19 LAB — COMPLETE METABOLIC PANEL WITH GFR
ALBUMIN: 4.2 g/dL (ref 3.5–5.2)
ALT: 40 U/L (ref 0–53)
AST: 24 U/L (ref 0–37)
Alkaline Phosphatase: 52 U/L (ref 39–117)
BUN: 22 mg/dL (ref 6–23)
CHLORIDE: 102 meq/L (ref 96–112)
CO2: 29 meq/L (ref 19–32)
Calcium: 9.2 mg/dL (ref 8.4–10.5)
Creat: 0.89 mg/dL (ref 0.50–1.35)
GFR, Est African American: >89 mL/min
GFR, Est Non African American: >89 mL/min
GLUCOSE: 102 mg/dL — AB (ref 70–99)
POTASSIUM: 4.4 meq/L (ref 3.5–5.3)
Sodium: 139 mEq/L (ref 135–145)
Total Bilirubin: 0.7 mg/dL (ref 0.2–1.2)
Total Protein: 7 g/dL (ref 6.0–8.3)

## 2015-05-19 LAB — CBC WITH DIFFERENTIAL/PLATELET
BASOS PCT: 0 % (ref 0–1)
Basophils Absolute: 0 10*3/uL (ref 0.0–0.1)
Eosinophils Absolute: 0.2 10*3/uL (ref 0.0–0.7)
Eosinophils Relative: 3 % (ref 0–5)
HCT: 45.9 % (ref 39.0–52.0)
Hemoglobin: 15.5 g/dL (ref 13.0–17.0)
LYMPHS PCT: 27 % (ref 12–46)
Lymphs Abs: 1.5 10*3/uL (ref 0.7–4.0)
MCH: 32 pg (ref 26.0–34.0)
MCHC: 33.8 g/dL (ref 30.0–36.0)
MCV: 94.6 fL (ref 78.0–100.0)
MONOS PCT: 5 % (ref 3–12)
MPV: 9.9 fL (ref 8.6–12.4)
Monocytes Absolute: 0.3 10*3/uL (ref 0.1–1.0)
Neutro Abs: 3.6 10*3/uL (ref 1.7–7.7)
Neutrophils Relative %: 65 % (ref 43–77)
Platelets: 183 10*3/uL (ref 150–400)
RBC: 4.85 MIL/uL (ref 4.22–5.81)
RDW: 13.6 % (ref 11.5–15.5)
WBC: 5.6 10*3/uL (ref 4.0–10.5)

## 2015-05-19 LAB — LIPID PANEL
CHOL/HDL RATIO: 4.8 ratio
Cholesterol: 148 mg/dL (ref 0–200)
HDL: 31 mg/dL — AB (ref >=40)
LDL Cholesterol: 66 mg/dL (ref 0–99)
Triglycerides: 257 mg/dL — ABNORMAL HIGH (ref <150)
VLDL: 51 mg/dL — ABNORMAL HIGH (ref 0–40)

## 2015-05-19 LAB — HIV ANTIBODY (ROUTINE TESTING W REFLEX): HIV 1&2 Ab, 4th Generation: NONREACTIVE

## 2015-05-19 MED ORDER — LISINOPRIL-HYDROCHLOROTHIAZIDE 10-12.5 MG PO TABS: ORAL_TABLET | ORAL | Status: DC

## 2015-05-19 NOTE — Patient Instructions (Signed)

## 2015-05-19 NOTE — Progress Notes (Signed)
This chart was scribed for Andrew Queen, MD by Andrew [name], ED Scribe. This patient was seen in room 21 and the patient's care was started at 8:15 AM.  Chief Complaint:  Chief Complaint  Patient presents with  . Follow-up  . Hypertension  . Medication Refill    HPI: Andrew [name] is a 49 y.o. male who reports to [facility] today for a 6 month follow up. Last seen by me 6 months ago for a CPE. Was advised to get a sleep study during last visit but was unable to due to financial reasons.   Pt has been doing well. He denies any complaints. He has not been tested for sleep apnea but does uses an automatic CPAP machine. His insurance will not cover the sleep study. He is scheduled to have the treadmill portion of stress test next month.   He is needing a refill of lisinopril hctz. Pt has been taking fish oil pills.   He had a colonoscopy. Pt is not a smoker.  Past Medical History  Diagnosis Date  . Hyperlipidemia   . Hypertension   . GERD (gastroesophageal reflux disease)   . Obesity (BMI 35.0-39.9 without comorbidity)    Past Surgical History  Procedure Laterality Date  . Wisdom teeth removal    . Hernia repair      when baby   History   Social History  . Marital Status: Married    Spouse Name: Andrew [name]  . Number of Children: 1  . Years of Education: College   Occupational History  .     Social History Main Topics  . Smoking status: Never Smoker   . Smokeless tobacco: Never Used  . Alcohol Use: Yes     Comment: very seldom  . Drug Use: No  . Sexual Activity: Yes    Birth Control/ Protection: None     Comment: number sex partners in the last 71 months  1   Other Topics Concern  . Not on file   Social History Narrative   Patient is married Andrew [name]) and lives at home with his wife and one child.   Patient is working full-time.   Patient has a college education.   Patient is right-handed.   Patient drinks two cups of coffee daily.   Exercise 2 times a week   On treadmill   Family History  Problem Relation Age of Onset  . Heart attack Mother   . COPD Mother   . Heart disease Mother   . Hyperlipidemia Mother   . Heart attack Father   . Heart disease Father   . Hyperlipidemia Father    Allergies  Allergen Reactions  . Testosterone     SEVERE RASH WITH ITCHING   Prior to Admission medications   Medication Sig Start Date End Date Taking? Authorizing Provider  aspirin 81 MG tablet Take 81 mg by mouth daily.      Historical Provider, MD  Cholecalciferol (VITAMIN D3) 2000 UNITS TABS Take 1 tablet by mouth daily.     Historical Provider, MD  fish oil-omega-3 fatty acids 1000 MG capsule Take 2 g by mouth daily.    Historical Provider, MD  fluticasone (FLONASE) 50 MCG/ACT nasal spray Place 2 sprays into both nostrils daily. 11/04/14   Andrew Russian, MD  lisinopril-hydrochlorothiazide (PRINZIDE,ZESTORETIC) 10-12.5 MG per tablet TAKE 1 TABLET BY MOUTH EVERY DAY. 02/07/15   Harrison Mons, PA-C     ROS: The patient denies fevers, chills, night sweats, unintentional  weight loss, chest pain, palpitations, wheezing, dyspnea on exertion, nausea, vomiting, abdominal pain, dysuria, hematuria, melena, numbness, weakness, or tingling.   All other systems have been reviewed and were otherwise negative with the exception of those mentioned in the HPI and as above.    PHYSICAL EXAM: Filed Vitals:   05/19/15 0814  BP: 135/86  Pulse: 69  Temp: 98 F (36.7 C)  Resp: 16   Body mass index is 45.1 kg/(m^2).   General: Alert, no acute distress, obesity HEENT:  Normocephalic, atraumatic, oropharynx patent. Eye: Andrew [name] [facility] Cardiovascular:  Regular rate and rhythm, no rubs murmurs or gallops.  No Carotid bruits, radial pulse intact. No pedal edema. BP-118/80 Respiratory: Clear to auscultation bilaterally.  No wheezes, rales, or rhonchi.  No cyanosis, no use of accessory musculature Abdominal: No organomegaly, abdomen is soft and non-tender, positive bowel  sounds.  No masses. Musculoskeletal: Gait intact. No edema, tenderness Skin: No rashes. Neurologic: Facial musculature symmetric. Psychiatric: Patient acts appropriately throughout our interaction. Lymphatic: No cervical or submandibular lymphadenopathy Genitourinary/Anorectal: No acute findings   LABS: Results for orders placed or performed in visit on 11/04/14  CBC with Differential  Result Value Ref Range   WBC 5.3 4.0 - 10.5 K/uL   RBC 5.21 4.22 - 5.81 MIL/uL   Hemoglobin 16.4 13.0 - 17.0 g/dL   HCT 48.2 39.0 - 52.0 %   MCV 92.5 78.0 - 100.0 fL   MCH 31.5 26.0 - 34.0 pg   MCHC 34.0 30.0 - 36.0 g/dL   RDW 13.5 11.5 - 15.5 %   Platelets 190 150 - 400 K/uL   MPV 9.5 8.6 - 12.4 fL   Neutrophils Relative % 64 43 - 77 %   Neutro Abs 3.4 1.7 - 7.7 K/uL   Lymphocytes Relative 28 12 - 46 %   Lymphs Abs 1.5 0.7 - 4.0 K/uL   Monocytes Relative 6 3 - 12 %   Monocytes Absolute 0.3 0.1 - 1.0 K/uL   Eosinophils Relative 2 0 - 5 %   Eosinophils Absolute 0.1 0.0 - 0.7 K/uL   Basophils Relative 0 0 - 1 %   Basophils Absolute 0.0 0.0 - 0.1 K/uL   Smear Review Criteria for review not met   COMPLETE METABOLIC PANEL WITH GFR  Result Value Ref Range   Sodium 138 135 - 145 mEq/L   Potassium 4.4 3.5 - 5.3 mEq/L   Chloride 99 96 - 112 mEq/L   CO2 30 19 - 32 mEq/L   Glucose, Bld 100 (H) 70 - 99 mg/dL   BUN 20 6 - 23 mg/dL   Creat 0.97 0.50 - 1.35 mg/dL   Total Bilirubin 1.1 0.2 - 1.2 mg/dL   Alkaline Phosphatase 55 39 - 117 U/L   AST 25 0 - 37 U/L   ALT 41 0 - 53 U/L   Total Protein 7.3 6.0 - 8.3 g/dL   Albumin 4.5 3.5 - 5.2 g/dL   Calcium 9.5 8.4 - 10.5 mg/dL   GFR, Est African American >89 mL/min   GFR, Est Non African American >89 mL/min  TSH  Result Value Ref Range   TSH 1.115 0.350 - 4.500 uIU/mL  Lipid panel  Result Value Ref Range   Cholesterol 172 0 - 200 mg/dL   Triglycerides 222 (H) <150 mg/dL   HDL 31 (L) >39 mg/dL   Total CHOL/HDL Ratio 5.5 Ratio   VLDL 44 (H) 0 - 40  mg/dL   LDL Cholesterol 97 0 - 99 mg/dL  PSA  Result Value Ref Range   PSA 0.93 <=4.00 ng/mL  POCT UA - Microscopic Only  Result Value Ref Range   WBC, Ur, HPF, POC neg    RBC, urine, microscopic 0-2    Bacteria, U Microscopic trace    Mucus, UA neg    Epithelial cells, urine per micros neg    Crystals, Ur, HPF, POC neg    Casts, Ur, LPF, POC neg    Yeast, UA neg   POCT urinalysis dipstick  Result Value Ref Range   Color, UA yellow    Clarity, UA clear    Glucose, UA neg    Bilirubin, UA neg    Ketones, UA neg    Spec Grav, UA 1.015    Blood, UA neg    pH, UA 7.0    Protein, UA neg    Urobilinogen, UA 0.2    Nitrite, UA neg    Leukocytes, UA Negative     ASSESSMENT/PLAN: Blood pressure is at goal. Patient is doing well. He is using his sleep apnea machine. He has not been able to afford a new sleep test for insurance reasons. No change in current treatment program   Gross sideeffects, risk and benefits, and alternatives of medications d/w patient. Patient is aware that all medications have potential sideeffects and we are unable to predict every sideeffect or drug-drug interaction that may occur.  Andrew Queen MD 05/19/2015 8:08 AM

## 2015-05-20 ENCOUNTER — Encounter: Payer: Self-pay | Admitting: Family Medicine

## 2015-06-03 ENCOUNTER — Ambulatory Visit (INDEPENDENT_AMBULATORY_CARE_PROVIDER_SITE_OTHER): Payer: 59

## 2015-06-03 ENCOUNTER — Encounter: Payer: 59 | Admitting: Physician Assistant

## 2015-06-03 DIAGNOSIS — R072 Precordial pain: Secondary | ICD-10-CM

## 2015-06-03 LAB — EXERCISE TOLERANCE TEST
CSEPHR: 87 %
CSEPPHR: 151 {beats}/min
Estimated workload: 10.1 METS
Exercise duration (min): 9 min
MPHR: 172 {beats}/min
RPE: 15
Rest HR: 76 {beats}/min

## 2015-06-06 ENCOUNTER — Telehealth: Payer: Self-pay

## 2015-06-06 NOTE — Telephone Encounter (Signed)
Pt called wanting a CB from [place]. He was seen on 7/21 by Dr. Everlene Farrier and he was given an Hiv screening test. His wife is very upset. Please advise at [phone]

## 2015-06-07 NOTE — Telephone Encounter (Signed)
Dr. Everlene Farrier, is there a reason you performed the test so I can advise pt.

## 2015-06-07 NOTE — Telephone Encounter (Signed)
Unfortunately, I am not sure why Dr. Everlene Farrier ran this test despite review of his last notes. I also do not know if there is a HIPPA violation here. Can someone please check if this patient has a ROI on file?

## 2015-06-08 NOTE — Telephone Encounter (Signed)
Dr. Everlene Farrier is not here this week. We need to check for ROI to make sure that this is not a HIPPA violation.

## 2015-08-02 ENCOUNTER — Encounter: Payer: Self-pay | Admitting: Emergency Medicine

## 2015-11-17 ENCOUNTER — Encounter: Payer: Self-pay | Admitting: Emergency Medicine

## 2015-11-17 ENCOUNTER — Encounter: Payer: 59 | Admitting: Emergency Medicine

## 2015-11-17 ENCOUNTER — Ambulatory Visit (INDEPENDENT_AMBULATORY_CARE_PROVIDER_SITE_OTHER): Payer: 59 | Admitting: Emergency Medicine

## 2015-11-17 VITALS — BP 120/80 | HR 86 | Temp 98.2°F | Resp 16 | Ht 65.5 in | Wt 287.6 lb

## 2015-11-17 DIAGNOSIS — E669 Obesity, unspecified: Secondary | ICD-10-CM

## 2015-11-17 DIAGNOSIS — Z13220 Encounter for screening for lipoid disorders: Secondary | ICD-10-CM

## 2015-11-17 DIAGNOSIS — N433 Hydrocele, unspecified: Secondary | ICD-10-CM | POA: Insufficient documentation

## 2015-11-17 DIAGNOSIS — Z0000 Encounter for general adult medical examination without abnormal findings: Secondary | ICD-10-CM

## 2015-11-17 DIAGNOSIS — Z125 Encounter for screening for malignant neoplasm of prostate: Secondary | ICD-10-CM

## 2015-11-17 DIAGNOSIS — I10 Essential (primary) hypertension: Secondary | ICD-10-CM | POA: Diagnosis not present

## 2015-11-17 DIAGNOSIS — R0683 Snoring: Secondary | ICD-10-CM | POA: Diagnosis not present

## 2015-11-17 LAB — CBC WITH DIFFERENTIAL/PLATELET
Basophils Absolute: 0 10*3/uL (ref 0.0–0.1)
Basophils Relative: 0 % (ref 0–1)
Eosinophils Absolute: 0.1 10*3/uL (ref 0.0–0.7)
Eosinophils Relative: 2 % (ref 0–5)
HEMATOCRIT: 47.2 % (ref 39.0–52.0)
HEMOGLOBIN: 16.1 g/dL (ref 13.0–17.0)
LYMPHS ABS: 1.4 10*3/uL (ref 0.7–4.0)
LYMPHS PCT: 25 % (ref 12–46)
MCH: 32 pg (ref 26.0–34.0)
MCHC: 34.1 g/dL (ref 30.0–36.0)
MCV: 93.8 fL (ref 78.0–100.0)
MPV: 9.8 fL (ref 8.6–12.4)
Monocytes Absolute: 0.3 10*3/uL (ref 0.1–1.0)
Monocytes Relative: 5 % (ref 3–12)
NEUTROS ABS: 3.7 10*3/uL (ref 1.7–7.7)
NEUTROS PCT: 68 % (ref 43–77)
Platelets: 205 10*3/uL (ref 150–400)
RBC: 5.03 MIL/uL (ref 4.22–5.81)
RDW: 13.2 % (ref 11.5–15.5)
WBC: 5.4 10*3/uL (ref 4.0–10.5)

## 2015-11-17 LAB — COMPLETE METABOLIC PANEL WITH GFR
ALT: 37 U/L (ref 9–46)
AST: 25 U/L (ref 10–40)
Albumin: 4.6 g/dL (ref 3.6–5.1)
Alkaline Phosphatase: 57 U/L (ref 40–115)
BILIRUBIN TOTAL: 1 mg/dL (ref 0.2–1.2)
BUN: 21 mg/dL (ref 7–25)
CO2: 30 mmol/L (ref 20–31)
CREATININE: 0.91 mg/dL (ref 0.60–1.35)
Calcium: 9.4 mg/dL (ref 8.6–10.3)
Chloride: 101 mmol/L (ref 98–110)
GFR, Est Non African American: >89 mL/min (ref >=60)
Glucose, Bld: 96 mg/dL (ref 65–99)
Potassium: 4.1 mmol/L (ref 3.5–5.3)
Sodium: 137 mmol/L (ref 135–146)
TOTAL PROTEIN: 7.4 g/dL (ref 6.1–8.1)

## 2015-11-17 LAB — POCT URINALYSIS DIP (MANUAL ENTRY)
Bilirubin, UA: NEGATIVE
Blood, UA: NEGATIVE
GLUCOSE UA: NEGATIVE
Ketones, POC UA: NEGATIVE
Leukocytes, UA: NEGATIVE
Nitrite, UA: NEGATIVE
Protein Ur, POC: NEGATIVE
SPEC GRAV UA: 1.02
Urobilinogen, UA: 0.2
pH, UA: 7

## 2015-11-17 LAB — LIPID PANEL
Cholesterol: 181 mg/dL (ref 125–200)
HDL: 32 mg/dL — ABNORMAL LOW (ref >=40)
LDL Cholesterol: 84 mg/dL (ref <130)
Total CHOL/HDL Ratio: 5.7 Ratio — ABNORMAL HIGH (ref <=5.0)
Triglycerides: 325 mg/dL — ABNORMAL HIGH (ref <150)
VLDL: 65 mg/dL — AB (ref <30)

## 2015-11-17 LAB — POC MICROSCOPIC URINALYSIS (UMFC): MUCUS RE: ABSENT

## 2015-11-17 NOTE — Progress Notes (Signed)
Subjective:  This chart was scribed for Andrew Russian, MD by Tamsen Roers, at [facility].  This patient was seen in room 22 and the patient's care was started at 9:43 AM.   Chief Complaint  Patient presents with  . Annual Exam     Patient ID: MOHAMEDAMIN [name], male    DOB: 03/07/1966, 50 y.o.   MRN: [id number]  HPI HPI Comments: Andrew [name] is a 50 y.o. male who presents to the [facility] for an annual physical exam. Patient notes that he feels "great".  He is complaint with his blood pressure medication but does not take it on the weekends.  He is also complaint with taking his aspirin.  He is unsure if he has sleep apnea and has not had a sleep study done in the past. Patients insurance will not pay for it.  Patient is exercising every day and states that if he is unable to, he stays as active as he can during the day.  Patient has a family history of heart disease.  His mother had COPD and his father had a heart attack but both of them were smokers. He does not smoke or dip.   He sees his cardiologist once a year.   Patient had a colonoscopy three years ago and has no family history of colon cancer.   Patient works and has 1 daughter.      Patient Active Problem List   Diagnosis Date Noted  . Snoring 03/02/2014  . Hypertension 05/13/2012  . Hypogonadism male 05/13/2012  . Chest pain 04/19/2011   Past Medical History  Diagnosis Date  . Hyperlipidemia   . Hypertension   . GERD (gastroesophageal reflux disease)   . Obesity (BMI 35.0-39.9 without comorbidity) ([place])   . Allergy    Past Surgical History  Procedure Laterality Date  . Wisdom teeth removal    . Hernia repair      when baby   Allergies  Allergen Reactions  . Testosterone     SEVERE RASH WITH ITCHING   Prior to Admission medications   Medication Sig Start Date End Date Taking? Authorizing Provider  aspirin 81 MG tablet Take 81 mg by mouth daily.       Historical Provider, MD  Cholecalciferol (VITAMIN D3) 2000 UNITS TABS Take 1 tablet by mouth daily.     Historical Provider, MD  fish oil-omega-3 fatty acids 1000 MG capsule Take 2 g by mouth daily.    Historical Provider, MD  fluticasone (FLONASE) 50 MCG/ACT nasal spray Place 2 sprays into both nostrils daily. 11/04/14   Andrew Russian, MD  lisinopril-hydrochlorothiazide (PRINZIDE,ZESTORETIC) 10-12.5 MG per tablet TAKE 1 TABLET BY MOUTH EVERY DAY. 05/19/15   Andrew Russian, MD   Social History   Social History  . Marital Status: Married    Spouse Name: [name]  . Number of Children: 1  . Years of Education: College   Occupational History  .     Social History Main Topics  . Smoking status: Never Smoker   . Smokeless tobacco: Never Used  . Alcohol Use: Yes     Comment: very seldom  . Drug Use: No  . Sexual Activity: Yes    Birth Control/ Protection: None     Comment: number sex partners in the last 80 months  1   Other Topics Concern  . Not on file   Social History Narrative   Patient is married (  Andrew [name]) and lives at home with his wife and one child.   Patient is working full-time.   Patient has a college education.   Patient is right-handed.   Patient drinks two cups of coffee daily.   Exercise 2 times a week  On treadmill     Review of Systems  Constitutional: Negative for fever and chills.  Eyes: Negative for pain, redness and itching.  Respiratory: Negative for cough, choking and shortness of breath.   Cardiovascular: Negative for chest pain.  Gastrointestinal: Negative for nausea and vomiting.  Musculoskeletal: Negative for neck pain and neck stiffness.  Skin: Negative for color change and wound.  Neurological: Negative for seizures, syncope and speech difficulty.       Objective:   Physical Exam  Filed Vitals:   11/17/15 0936  BP: 120/80  Pulse: 86  Temp: 98.2 F (36.8 C)  TempSrc: Oral  Resp: 16  Height: 5' 5.5" (1.664 m)  Weight: 287 lb 9.6 oz  (130.455 kg)  SpO2: 97%    CONSTITUTIONAL: He is morbidly obese.  HEAD: Normocephalic/atraumatic EYES: EOMI/PERRL ENMT: Mucous membranes moist NECK: supple no meningeal signs SPINE/BACK:entire spine nontender CV: S1/S2 noted, no murmurs/rubs/gallops noted LUNGS: Lungs are clear to auscultation bilaterally, no apparent distress ABDOMEN: soft, nontender, no rebound or guarding, bowel sounds noted throughout abdomen GU:no cva tenderness NEURO: Pt is awake/alert/appropriate, moves all extremitiesx4.  No facial droop.   EXTREMITIES:  He has some stasis changes of both extremities, left more than right.  SKIN: warm, color normal PSYCH: no abnormalities of mood noted, alert and oriented to situation Genital: large hydrocele involving the right testicle , prostate is normal.      Assessment & Plan:  His blood pressure is at goal. His major issue is his obesity. He still cannot afford to have a sleep study done. He also was noted on exam to have a large right hydrocele but does not want to have a ultrasound done at the present time. Routine labs were done today he will follow-up in 6 months.I personally performed the services described in this documentation, which was scribed in my presence. The recorded information has been reviewed and is accurate.Nena Jordan MD

## 2015-11-18 LAB — PSA: PSA: 0.94 ng/mL (ref <=4.00)

## 2016-01-16 ENCOUNTER — Telehealth: Payer: Self-pay

## 2016-01-16 DIAGNOSIS — N433 Hydrocele, unspecified: Secondary | ICD-10-CM

## 2016-01-16 NOTE — Telephone Encounter (Signed)
The patient saw Dr Everlene Farrier in January and declined an ultrasound at that time.  He now would like an order placed for this.  Notes from the last OV: "He also was noted on exam to have a large right hydrocele but does not want to have a ultrasound done at the present time."   Please advise, thank you.  CB#: [phone]

## 2016-01-16 NOTE — Telephone Encounter (Signed)
Done

## 2016-01-20 ENCOUNTER — Ambulatory Visit
Admission: RE | Admit: 2016-01-20 | Discharge: 2016-01-20 | Disposition: A | Discharge status: Home / Self Care | Payer: 59 | Source: Ambulatory Visit | Attending: Emergency Medicine | Admitting: Emergency Medicine

## 2016-01-20 DIAGNOSIS — N433 Hydrocele, unspecified: Secondary | ICD-10-CM

## 2016-01-21 ENCOUNTER — Other Ambulatory Visit: Payer: Self-pay | Admitting: Emergency Medicine

## 2016-01-21 DIAGNOSIS — N432 Other hydrocele: Secondary | ICD-10-CM

## 2016-02-22 ENCOUNTER — Other Ambulatory Visit: Payer: Self-pay | Admitting: Emergency Medicine

## 2016-03-08 ENCOUNTER — Other Ambulatory Visit: Payer: Self-pay | Admitting: Emergency Medicine

## 2016-05-16 ENCOUNTER — Ambulatory Visit: Payer: 59 | Admitting: Physician Assistant

## 2016-08-17 ENCOUNTER — Ambulatory Visit (INDEPENDENT_AMBULATORY_CARE_PROVIDER_SITE_OTHER): Payer: 59 | Admitting: Physician Assistant

## 2016-08-17 VITALS — BP 124/80 | HR 82 | Temp 98.0°F | Resp 18 | Ht 65.5 in | Wt 294.0 lb

## 2016-08-17 DIAGNOSIS — I10 Essential (primary) hypertension: Secondary | ICD-10-CM

## 2016-08-17 DIAGNOSIS — IMO0001 Reserved for inherently not codable concepts without codable children: Secondary | ICD-10-CM

## 2016-08-17 DIAGNOSIS — Z6841 Body Mass Index (BMI) 40.0 and over, adult: Secondary | ICD-10-CM

## 2016-08-17 DIAGNOSIS — N433 Hydrocele, unspecified: Secondary | ICD-10-CM

## 2016-08-17 DIAGNOSIS — E6609 Other obesity due to excess calories: Secondary | ICD-10-CM

## 2016-08-17 LAB — LIPID PANEL
CHOL/HDL RATIO: 5.4 ratio — AB (ref <=5.0)
CHOLESTEROL: 172 mg/dL (ref 125–200)
HDL: 32 mg/dL — AB (ref >=40)
LDL Cholesterol: 87 mg/dL (ref <130)
Triglycerides: 265 mg/dL — ABNORMAL HIGH (ref <150)
VLDL: 53 mg/dL — ABNORMAL HIGH (ref <30)

## 2016-08-17 LAB — COMPLETE METABOLIC PANEL WITH GFR
ALT: 42 U/L (ref 9–46)
AST: 29 U/L (ref 10–40)
Albumin: 4.6 g/dL (ref 3.6–5.1)
Alkaline Phosphatase: 53 U/L (ref 40–115)
BUN: 20 mg/dL (ref 7–25)
CHLORIDE: 100 mmol/L (ref 98–110)
CO2: 27 mmol/L (ref 20–31)
CREATININE: 0.98 mg/dL (ref 0.60–1.35)
Calcium: 9.5 mg/dL (ref 8.6–10.3)
GFR, Est Non African American: >89 mL/min (ref >=60)
Glucose, Bld: 110 mg/dL — ABNORMAL HIGH (ref 65–99)
Potassium: 4.6 mmol/L (ref 3.5–5.3)
SODIUM: 139 mmol/L (ref 135–146)
Total Bilirubin: 0.8 mg/dL (ref 0.2–1.2)
Total Protein: 7.3 g/dL (ref 6.1–8.1)

## 2016-08-17 LAB — CBC
HCT: 48 % (ref 38.5–50.0)
Hemoglobin: 16.9 g/dL (ref 13.2–17.1)
MCH: 33.1 pg — ABNORMAL HIGH (ref 27.0–33.0)
MCHC: 35.2 g/dL (ref 32.0–36.0)
MCV: 94.1 fL (ref 80.0–100.0)
MPV: 10 fL (ref 7.5–12.5)
PLATELETS: 206 10*3/uL (ref 140–400)
RBC: 5.1 MIL/uL (ref 4.20–5.80)
RDW: 12.8 % (ref 11.0–15.0)
WBC: 5.8 10*3/uL (ref 3.8–10.8)

## 2016-08-17 LAB — HEMOGLOBIN A1C
Hgb A1c MFr Bld: 5.7 % — ABNORMAL HIGH (ref <5.7)
MEAN PLASMA GLUCOSE: 117 mg/dL

## 2016-08-17 LAB — TSH: TSH: 1.11 mIU/L (ref 0.40–4.50)

## 2016-08-17 MED ORDER — LISINOPRIL-HYDROCHLOROTHIAZIDE 10-12.5 MG PO TABS: 1.0000 | ORAL_TABLET | Freq: Every day | ORAL | 1 refills | Status: DC

## 2016-08-17 NOTE — Progress Notes (Signed)
Patient ID: Andrew [name], male   DOB: 12/30/1965, 50 y.o.   MRN: [id number]   By signing my name below, I, Andrew [name], attest that this documentation has been prepared under the direction and in the presence of Andrew Fendt, PA-C Electronically Signed: Ladene [name], ED Scribe 08/17/2016 at 8:17 AM.  08/17/2016 8:17 AM   DOB: 17-Mar-1966 / MRN: [id number]  SUBJECTIVE:  Andrew [name] is a 50 y.o. male presenting for a medication refill of prinzide. Pt's vitals look normal. He sees a cardiologist once a year. Has seen Andrew [name] in the past. Had a colonoscopy 3 years ago. Pt states that he has considered bariatric surgery for weight loss but is unsure. He recently joined the [employer] 1 month ago and was walking on the treadmill on the "fat burner" setting M-F for weight loss for 3 weeks. He states that he lost 9 lbs from today's weight until his "knee went out" . Knee pain has improved and he plans to return to the [facility]. Pt has a physical job. He states that his father was a smoker, passed at 61 y.o from a MI and had a smaller frame. Pt consumes approximately 6 beers/month. No h/o tobacco use. Pt was also seen previously for hydrocele and states that he would like to have this reevaluated. He denies HA, leg swelling, nausea, visual disturbances, abnormal cough. He takes his medication without fail.   He is allergic to testosterone.   He  has a past medical history of Allergy; GERD (gastroesophageal reflux disease); Hyperlipidemia; Hypertension; and Obesity (BMI 35.0-39.9 without comorbidity).    He  reports that he has never smoked. He has never used smokeless tobacco. He reports that he drinks about 1.2 - 1.8 oz of alcohol per week . He reports that he does not use drugs. He  reports that he currently engages in sexual activity. He reports using the following method of birth control/protection: None. The patient  has a past surgical history that includes Wisdom teeth removal and Hernia  repair.  His family history includes COPD in his mother; Heart attack in his father and mother; Heart disease in his father and mother; Hyperlipidemia in his father and mother.  Review of Systems  Eyes: Negative for blurred vision and double vision.  Respiratory: Negative for cough.   Cardiovascular: Negative for leg swelling.  Gastrointestinal: Negative for nausea.  Neurological: Negative for headaches.    The problem list and medications were reviewed and updated by myself where necessary and exist elsewhere in the encounter.   OBJECTIVE:  BP 124/80 (BP Location: Right Arm, Patient Position: Sitting, Cuff Size: Large)   Pulse 82   Temp 98 F (36.7 C) (Oral)   Resp 18   Ht 5' 5.5" (1.664 m)   Wt 294 lb (133.4 kg)   SpO2 97%   BMI 48.18 kg/m   Physical Exam  Constitutional: He is oriented to person, place, and time. He appears well-developed and well-nourished. No distress.  HENT:  Head: Normocephalic and atraumatic.  Eyes: Conjunctivae and EOM are normal.  Neck: Neck supple. Carotid bruit is not present.  Cardiovascular: Normal rate and regular rhythm.   No murmur heard. Pulmonary/Chest: Effort normal. No respiratory distress.  Lungs are clear to auscultation bilaterally.   Musculoskeletal: Normal range of motion. He exhibits no edema.  Neurological: He is alert and oriented to person, place, and time.  Skin: Skin is warm and dry. He is not diaphoretic.  Venous statis rash on L. No  leg swelling. No edema bilaterally.   Psychiatric: He has a normal mood and affect. His behavior is normal.  Nursing note and vitals reviewed.  Lab Results  Component Value Date   HGBA1C 5.5 02/02/2014     No results found for this or any previous visit (from the past 72 hour(s)).  No results found.  ASSESSMENT AND PLAN  Andrew [name] was seen today for medication refill.  Diagnoses and all orders for this visit:  Class 3 obesity due to excess calories with body mass index (BMI) of 45.0  to 49.9 in adult, unspecified whether serious comorbidity present [facility]): I did advise against bariatric surgery at this time as he is doing very well despite excess weight.  -     Hemoglobin A1C -     TSH -     Lipid panel  Essential hypertension: Medication refilled for 6 months.   -     COMPLETE METABOLIC PANEL WITH GFR -     CBC  Hydrocele, right: Patient requesting to go back for a procedure.  -     Ambulatory referral to Urology  Other orders -     lisinopril-hydrochlorothiazide (PRINZIDE,ZESTORETIC) 10-12.5 MG tablet; Take 1 tablet by mouth daily.     The patient is advised to call or return to clinic if he does not see an improvement in symptoms, or to seek the care of the closest emergency department if he worsens with the above plan.   Andrew [name], MHS, PA-C [facility] 08/17/2016 8:17 AM

## 2016-08-17 NOTE — Patient Instructions (Signed)
     IF you received an x-ray today, you will receive an invoice from [facility]. Please contact [facility] at [phone] with questions or concerns regarding your invoice.   IF you received labwork today, you will receive an invoice from [facility]. Please contact [facility] at [phone] with questions or concerns regarding your invoice.   Our billing staff will not be able to assist you with questions regarding bills from these companies.  You will be contacted with the lab results as soon as they are available. The fastest way to get your results is to activate your My Chart account. Instructions are located on the last page of this paperwork. If you have not heard from us regarding the results in 2 weeks, please contact this office.      

## 2016-09-01 ENCOUNTER — Encounter: Payer: Self-pay | Admitting: *Deleted

## 2016-09-07 ENCOUNTER — Other Ambulatory Visit: Payer: Self-pay | Admitting: Urology

## 2016-09-11 ENCOUNTER — Encounter (HOSPITAL_BASED_OUTPATIENT_CLINIC_OR_DEPARTMENT_OTHER): Payer: Self-pay | Admitting: *Deleted

## 2016-09-11 NOTE — Progress Notes (Signed)
   09/11/16 1311  OBSTRUCTIVE SLEEP APNEA  Have you ever been diagnosed with sleep apnea through a sleep study? No  Do you snore loudly (loud enough to be heard through closed doors)?  1  Do you often feel tired, fatigued, or sleepy during the daytime (such as falling asleep during driving or talking to someone)? 0  Has anyone observed you stop breathing during your sleep? 1  Do you have, or are you being treated for high blood pressure? 1  BMI more than 35 kg/m2? 1  Age > 50 (1-yes) 0  Neck circumference greater than:Male 16 inches or larger, Male 17inches or larger? 1  Male Gender (Yes=1) 1  Obstructive Sleep Apnea Score 6  Score 5 or greater  Results sent to PCP

## 2016-09-11 NOTE — Progress Notes (Signed)
NPO AFTER MN.  ARRIVE AT 0730.  NEEDS ISTAT 8 AND EKG.

## 2016-09-16 NOTE — H&P (Signed)
CC: I have scrotal swelling.  HPI: Andrew [name] is a 50 year-old male established patient who is here for scrotal swelling.  His hydrocele is on the right side. The patient has undergone a scrotal ultrasound. He does not have pain on the side of his hydrocele.   He has not had injuries to the testicles or scrotum. He has had the following surgeries: Hernia Repair. The patient denies having the following scrotal surgeries: Vasectomy, Hydrocele Repair, Undescended Testis Surgery, and Varicocele Repair. He has not had a testicular infection.   Since I have seen him last he said his hydrocele seems to have increased in size. It has resulted in his penis being enveloped by the surrounding skin. He is to the point where he would like to proceed with repair.     ALLERGIES: Testosterone - Itching, Skin Rash    MEDICATIONS: Aspirin 81 MG TABS Oral  Lisinopril-Hydrochlorothiazide 10-12.5 MG Oral Tablet Oral  Multi-Day TABS Oral     GU PSH: Hernia Repair - 01/27/2016      PSH Notes: Hernia Repair   NON-GU PSH: None   GU PMH: Cyst of epididymis, Epididymal cyst - 01/27/2016 Hydrocele, Unspec, Right hydrocele - 01/27/2016 Testicular hypofunction, Hypogonadism, testicular - 01/26/2016      PMH Notes: Right hydrocele: Scrotal ultrasound 01/20/16 revealed a large right hydrocele, left epididymal cysts but no abnormality of the testicle. He told me the epididymal cyst has been something he has been able to feel since he was a child. It has not changed. He had a right inguinal hernia repair as a child and has developed some swelling in the right hemiscrotum.       NON-GU PMH: Encounter for general adult medical examination without abnormal findings, Encounter for preventive health examination - 01/27/2016 Personal history of other diseases of the circulatory system, History of hypertension - 01/27/2016    FAMILY HISTORY: Myocardial Infarction - Runs In Family   SOCIAL HISTORY: Marital Status:  Married Current Smoking Status: Patient has never smoked.  Does drink.  Drinks 1 caffeinated drink per day.     Notes: Occasional alcohol use, Never a smoker, Caffeine use   REVIEW OF SYSTEMS:    GU Review Male:   Patient denies frequent urination, hard to postpone urination, burning/ pain with urination, get up at night to urinate, leakage of urine, stream starts and stops, trouble starting your stream, have to strain to urinate , erection problems, and penile pain.  Gastrointestinal (Upper):   Patient denies nausea, vomiting, and indigestion/ heartburn.  Gastrointestinal (Lower):   Patient denies diarrhea and constipation.  Constitutional:   Patient denies fever, night sweats, weight loss, and fatigue.  Skin:   Patient denies skin rash/ lesion and itching.  Eyes:   Patient denies blurred vision and double vision.  Ears/ Nose/ Throat:   Patient denies sore throat and sinus problems.  Hematologic/Lymphatic:   Patient denies swollen glands and easy bruising.  Cardiovascular:   Patient denies leg swelling and chest pains.  Respiratory:   Patient denies cough and shortness of breath.  Endocrine:   Patient denies excessive thirst.  Musculoskeletal:   Patient denies back pain and joint pain.  Neurological:   Patient denies headaches and dizziness.  Psychologic:   Patient denies depression and anxiety.   VITAL SIGNS:     Weight 192 lb / 87.09 kg  BP 121/67 mmHg  Pulse 71 /min   Physical Exam  Constitutional: Well nourished and well developed . No acute distress.  ENT:. The ears and nose are normal in appearance.    Neck: The appearance of the neck is normal and no neck mass is present.    Pulmonary: No respiratory distress and normal respiratory rhythm and effort.    Cardiovascular: Heart rate and rhythm are normal . No peripheral edema.    Abdomen: The abdomen is soft and nontender. No masses are palpated. No CVA tenderness. No hernias are palpable. No hepatosplenomegaly  noted.    Scrotum: No lesions. No edema. No cysts. No warts.  Epididymides: Right: Testicle is not palpable due to his hydrocele. Left: No spermatocele, no masses, no cysts, no tenderness, no induration, no enlargement.   Testes: 5+ cm hydrocele right testis. No tenderness, no swelling, no enlargement left testis. No tenderness, no swelling, no enlargement right testis. Normal location left testis. Normal location right testis. No mass, no cyst, no varicocele, no hydrocele left testis. No mass, no cyst, no varicocele right testis.   Urethral Meatus: Normal size. No lesion, no wart, no discharge, no polyp. Normal location.  Penis: Circumcised, no warts, no cracks. No dorsal Peyronie's plaques, no left corporal Peyronie's plaques, no right corporal Peyronie's plaques, no scarring, no warts. No balanitis, no meatal stenosis.   Lymphatics: The femoral and inguinal nodes are not enlarged or tender.    Skin: Normal skin turgor, no visible rash and no visible skin lesions.    Neuro/Psych:. Mood and affect are appropriate.      PAST DATA REVIEWED:  Source Of History:  Patient  Lab Test Review:   PSA, BUN/Creatinine  Records Review:   Previous Doctor Records, Previous Patient Records, POC Tool   10/30/15  PSA  Total PSA 0.94 ng/dl   Notes:                     His creatinine in 10/17 was normal at 0.98. A PSA in 1/17 was 0.94.   PROCEDURES: None   ASSESSMENT:      ICD-10 Details  1 GU:   Hydrocele, Unspec - N43.3 Right, Worsening - He will be scheduled for a right hydrocelectomy.          Notes:   We discussed treatment options for his right hydrocele being either aspiration or surgical removal. We discussed the high probability of recurrence with aspiration and I therefore have recommended hydrocelectomy. I went over the procedure with him in detail including the incision used, the risks and complications, the probability of success as well as the outpatient nature of the procedure and the  anticipated postoperative course. He understands the like to proceed.    PLAN:   Right hydrocelectomy

## 2016-09-16 NOTE — Anesthesia Preprocedure Evaluation (Addendum)
Anesthesia Evaluation  Patient identified by MRN, date of birth, ID band Patient awake    Reviewed: Allergy & Precautions, NPO status , Patient's Chart, lab work & pertinent test results  History of Anesthesia Complications Negative for: history of anesthetic complications  Airway Mallampati: III  TM Distance: >3 FB Neck ROM: Full   Comment: Thick neck, beard and mustache Dental  (+) Teeth Intact, Dental Advisory Given   Pulmonary neg shortness of breath, sleep apnea (symptoms suggestive, no diagnosis) , neg COPD, neg recent URI,    breath sounds clear to auscultation       Cardiovascular hypertension, Pt. on medications (-) angina(-) Past MI, (-) Cardiac Stents, (-) Orthopnea and (-) PND + dysrhythmias (left posterior fascicular block)  Rhythm:Regular Rate:Normal  HLD  Exercise Stress Test 06/03/2015: No ST changes or chest pain. Hypertensive response to exercise.   Neuro/Psych negative neurological ROS     GI/Hepatic negative GI ROS, Neg liver ROS,   Endo/Other  neg diabetesMorbid obesity  Renal/GU negative Renal ROS     Musculoskeletal   Abdominal (+) + obese,   Peds  Hematology negative hematology ROS (+)   Anesthesia Other Findings Right hydrocele  Reproductive/Obstetrics                           Anesthesia Physical Anesthesia Plan  ASA: III  Anesthesia Plan: General   Post-op Pain Management:    Induction: Intravenous  Airway Management Planned: LMA  Additional Equipment:   Intra-op Plan:   Post-operative Plan: Extubation in OR  Informed Consent: I have reviewed the patients History and Physical, chart, labs and discussed the procedure including the risks, benefits and alternatives for the proposed anesthesia with the patient or authorized representative who has indicated his/her understanding and acceptance.   Dental advisory given  Plan Discussed with:  CRNA  Anesthesia Plan Comments: (Risks of general anesthesia discussed including, but not limited to, sore throat, hoarse voice, chipped/damaged teeth, injury to vocal cords, nausea and vomiting, allergic reactions, lung infection, heart attack, stroke, and death. All questions answered. )       Anesthesia Quick Evaluation

## 2016-09-17 ENCOUNTER — Ambulatory Visit (HOSPITAL_BASED_OUTPATIENT_CLINIC_OR_DEPARTMENT_OTHER): Payer: 59 | Admitting: Anesthesiology

## 2016-09-17 ENCOUNTER — Other Ambulatory Visit: Payer: Self-pay

## 2016-09-17 ENCOUNTER — Ambulatory Visit (HOSPITAL_BASED_OUTPATIENT_CLINIC_OR_DEPARTMENT_OTHER)
Admission: RE | Admit: 2016-09-17 | Discharge: 2016-09-17 | Disposition: A | Discharge status: Home / Self Care | Payer: 59 | Source: Ambulatory Visit | Attending: Urology | Admitting: Urology

## 2016-09-17 ENCOUNTER — Encounter (HOSPITAL_BASED_OUTPATIENT_CLINIC_OR_DEPARTMENT_OTHER): Payer: Self-pay | Admitting: *Deleted

## 2016-09-17 ENCOUNTER — Encounter (HOSPITAL_BASED_OUTPATIENT_CLINIC_OR_DEPARTMENT_OTHER)
Admission: RE | Disposition: A | Discharge status: Home / Self Care | Payer: Self-pay | Source: Ambulatory Visit | Attending: Urology

## 2016-09-17 DIAGNOSIS — N433 Hydrocele, unspecified: Secondary | ICD-10-CM | POA: Diagnosis not present

## 2016-09-17 DIAGNOSIS — Z7982 Long term (current) use of aspirin: Secondary | ICD-10-CM | POA: Insufficient documentation

## 2016-09-17 DIAGNOSIS — Z6841 Body Mass Index (BMI) 40.0 and over, adult: Secondary | ICD-10-CM | POA: Insufficient documentation

## 2016-09-17 DIAGNOSIS — I10 Essential (primary) hypertension: Secondary | ICD-10-CM | POA: Insufficient documentation

## 2016-09-17 HISTORY — PX: HYDROCELE EXCISION: SHX482

## 2016-09-17 HISTORY — DX: Presence of spectacles and contact lenses: Z97.3

## 2016-09-17 HISTORY — DX: Personal history of other specified conditions: Z87.898

## 2016-09-17 HISTORY — DX: Family history of ischemic heart disease and other diseases of the circulatory system: Z82.49

## 2016-09-17 HISTORY — DX: Left posterior fascicular block: I44.5

## 2016-09-17 HISTORY — DX: Morbid (severe) obesity due to excess calories: E66.01

## 2016-09-17 HISTORY — DX: Hydrocele, unspecified: N43.3

## 2016-09-17 HISTORY — DX: Cyst of epididymis: N50.3

## 2016-09-17 LAB — POCT I-STAT, CHEM 8
BUN: 23 mg/dL — ABNORMAL HIGH (ref 6–20)
Calcium, Ion: 1.27 mmol/L (ref 1.15–1.40)
Chloride: 100 mmol/L — ABNORMAL LOW (ref 101–111)
Creatinine, Ser: 0.9 mg/dL (ref 0.61–1.24)
Glucose, Bld: 115 mg/dL — ABNORMAL HIGH (ref 65–99)
HCT: 44 % (ref 39.0–52.0)
Hemoglobin: 15 g/dL (ref 13.0–17.0)
Potassium: 4 mmol/L (ref 3.5–5.1)
Sodium: 141 mmol/L (ref 135–145)
TCO2: 27 mmol/L (ref 0–100)

## 2016-09-17 SURGERY — HYDROCELECTOMY
Anesthesia: General | Site: Scrotum | Laterality: Right

## 2016-09-17 MED ORDER — FENTANYL CITRATE (PF) 100 MCG/2ML IJ SOLN
INTRAMUSCULAR | Status: AC
  Filled 2016-09-17: qty 2

## 2016-09-17 MED ORDER — BUPIVACAINE-EPINEPHRINE 0.5% -1:200000 IJ SOLN
INTRAMUSCULAR | Status: DC | PRN
  Administered 2016-09-17: 18 mL

## 2016-09-17 MED ORDER — KETOROLAC TROMETHAMINE 30 MG/ML IJ SOLN
INTRAMUSCULAR | Status: DC | PRN
  Administered 2016-09-17: 30 mg via INTRAVENOUS

## 2016-09-17 MED ORDER — LIDOCAINE HCL (CARDIAC) 20 MG/ML IV SOLN
INTRAVENOUS | Status: DC | PRN
  Administered 2016-09-17: 100 mg via INTRAVENOUS

## 2016-09-17 MED ORDER — FENTANYL CITRATE (PF) 100 MCG/2ML IJ SOLN
INTRAMUSCULAR | Status: DC | PRN
  Administered 2016-09-17 (×5): 25 ug via INTRAVENOUS
  Administered 2016-09-17: 50 ug via INTRAVENOUS
  Administered 2016-09-17: 25 ug via INTRAVENOUS

## 2016-09-17 MED ORDER — LACTATED RINGERS IV SOLN
INTRAVENOUS | Status: DC
  Administered 2016-09-17 (×2): via INTRAVENOUS
  Filled 2016-09-17: qty 1000

## 2016-09-17 MED ORDER — DEXAMETHASONE SODIUM PHOSPHATE 10 MG/ML IJ SOLN
INTRAMUSCULAR | Status: AC
  Filled 2016-09-17: qty 1

## 2016-09-17 MED ORDER — ONDANSETRON HCL 4 MG/2ML IJ SOLN
INTRAMUSCULAR | Status: DC | PRN
  Administered 2016-09-17: 4 mg via INTRAVENOUS

## 2016-09-17 MED ORDER — DEXAMETHASONE SODIUM PHOSPHATE 4 MG/ML IJ SOLN
INTRAMUSCULAR | Status: DC | PRN
  Administered 2016-09-17: 10 mg via INTRAVENOUS

## 2016-09-17 MED ORDER — METOCLOPRAMIDE HCL 5 MG/ML IJ SOLN
INTRAMUSCULAR | Status: AC
  Filled 2016-09-17: qty 2

## 2016-09-17 MED ORDER — PROPOFOL 10 MG/ML IV BOLUS
INTRAVENOUS | Status: AC
  Filled 2016-09-17: qty 20

## 2016-09-17 MED ORDER — PROPOFOL 10 MG/ML IV BOLUS
INTRAVENOUS | Status: DC | PRN
  Administered 2016-09-17: 250 mg via INTRAVENOUS
  Administered 2016-09-17: 150 mg via INTRAVENOUS

## 2016-09-17 MED ORDER — DEXTROSE 5 % IV SOLN
3.0000 g | INTRAVENOUS | Status: AC
  Administered 2016-09-17: 3 g via INTRAVENOUS
  Filled 2016-09-17 (×2): qty 3000

## 2016-09-17 MED ORDER — MIDAZOLAM HCL 5 MG/5ML IJ SOLN
INTRAMUSCULAR | Status: DC | PRN
  Administered 2016-09-17: 2 mg via INTRAVENOUS

## 2016-09-17 MED ORDER — GLYCOPYRROLATE 0.2 MG/ML IV SOSY
PREFILLED_SYRINGE | INTRAVENOUS | Status: AC
  Filled 2016-09-17: qty 3

## 2016-09-17 MED ORDER — PROPOFOL 10 MG/ML IV BOLUS
INTRAVENOUS | Status: AC
  Filled 2016-09-17: qty 40

## 2016-09-17 MED ORDER — MIDAZOLAM HCL 2 MG/2ML IJ SOLN
INTRAMUSCULAR | Status: AC
  Filled 2016-09-17: qty 2

## 2016-09-17 MED ORDER — ONDANSETRON HCL 4 MG/2ML IJ SOLN
INTRAMUSCULAR | Status: AC
  Filled 2016-09-17: qty 2

## 2016-09-17 MED ORDER — GLYCOPYRROLATE 0.2 MG/ML IJ SOLN
INTRAMUSCULAR | Status: DC | PRN
  Administered 2016-09-17: 0.2 mg via INTRAVENOUS

## 2016-09-17 MED ORDER — TRIPLE ANTIBIOTIC 3.5-400-5000 EX OINT
TOPICAL_OINTMENT | CUTANEOUS | Status: DC | PRN
  Administered 2016-09-17: 1 via TOPICAL

## 2016-09-17 MED ORDER — CEFAZOLIN SODIUM-DEXTROSE 2-4 GM/100ML-% IV SOLN
2.0000 g | INTRAVENOUS | Status: DC
  Filled 2016-09-17: qty 100

## 2016-09-17 MED ORDER — OXYCODONE HCL 10 MG PO TABS
10.0000 mg | ORAL_TABLET | ORAL | 0 refills | Status: DC | PRN
Start: 2016-09-17 — End: 2017-01-11

## 2016-09-17 MED ORDER — METOCLOPRAMIDE HCL 5 MG/ML IJ SOLN
INTRAMUSCULAR | Status: DC | PRN
  Administered 2016-09-17: 10 mg via INTRAVENOUS

## 2016-09-17 MED ORDER — LIDOCAINE 2% (20 MG/ML) 5 ML SYRINGE
INTRAMUSCULAR | Status: AC
  Filled 2016-09-17: qty 5

## 2016-09-17 SURGICAL SUPPLY — 31 items
BLADE CLIPPER SURG (BLADE) ×2 IMPLANT
BLADE SURG 15 STRL LF DISP TIS (BLADE) ×1 IMPLANT
BLADE SURG 15 STRL SS (BLADE) ×2
BNDG GAUZE ELAST 4 BULKY (GAUZE/BANDAGES/DRESSINGS) ×2 IMPLANT
CANISTER SUCTION 2500CC (MISCELLANEOUS) ×1 IMPLANT
CLEANER CAUTERY TIP 5X5 PAD (MISCELLANEOUS) IMPLANT
COVER BACK TABLE 60X90IN (DRAPES) ×2 IMPLANT
COVER MAYO STAND STRL (DRAPES) ×2 IMPLANT
DRAPE LAPAROTOMY 100X72 PEDS (DRAPES) ×2 IMPLANT
ELECT REM PT RETURN 9FT ADLT (ELECTROSURGICAL) ×2
ELECTRODE REM PT RTRN 9FT ADLT (ELECTROSURGICAL) IMPLANT
GLOVE BIO SURGEON STRL SZ8 (GLOVE) ×2 IMPLANT
GOWN STRL REUS W/ TWL LRG LVL3 (GOWN DISPOSABLE) ×1 IMPLANT
GOWN STRL REUS W/ TWL XL LVL3 (GOWN DISPOSABLE) ×1 IMPLANT
GOWN STRL REUS W/TWL LRG LVL3 (GOWN DISPOSABLE) ×2
GOWN STRL REUS W/TWL XL LVL3 (GOWN DISPOSABLE) ×2
KIT ROOM TURNOVER WOR (KITS) ×2 IMPLANT
NDL HYPO 25X1 1.5 SAFETY (NEEDLE) IMPLANT
NEEDLE HYPO 25X1 1.5 SAFETY (NEEDLE) ×2 IMPLANT
NS IRRIG 500ML POUR BTL (IV SOLUTION) ×2 IMPLANT
PACK BASIN DAY SURGERY FS (CUSTOM PROCEDURE TRAY) ×2 IMPLANT
PAD CLEANER CAUTERY TIP 5X5 (MISCELLANEOUS) ×1
PENCIL BUTTON HOLSTER BLD 10FT (ELECTRODE) ×2 IMPLANT
SPONGE GAUZE 4X4 12PLY STER LF (GAUZE/BANDAGES/DRESSINGS) ×1 IMPLANT
SUPPORT SCROTAL LG STRP (MISCELLANEOUS) ×2 IMPLANT
SUT CHROMIC 3 0 SH 27 (SUTURE) ×5 IMPLANT
SYR CONTROL 10ML LL (SYRINGE) ×1 IMPLANT
TOWEL OR 17X24 6PK STRL BLUE (TOWEL DISPOSABLE) ×4 IMPLANT
TRAY DSU PREP LF (CUSTOM PROCEDURE TRAY) ×2 IMPLANT
TUBE CONNECTING 12X1/4 (SUCTIONS) ×2 IMPLANT
YANKAUER SUCT BULB TIP NO VENT (SUCTIONS) ×2 IMPLANT

## 2016-09-17 NOTE — Discharge Instructions (Signed)
Post Anesthesia Home Care Instructions  Activity: Get plenty of rest for the remainder of the day. A responsible adult should stay with you for 24 hours following the procedure.  For the next 24 hours, DO NOT: -Drive a car -Paediatric nurse -Drink alcoholic beverages -Take any medication unless instructed by your physician -Make any legal decisions or sign important papers.  Meals: Start with liquid foods such as gelatin or soup. Progress to regular foods as tolerated. Avoid greasy, spicy, heavy foods. If nausea and/or vomiting occur, drink only clear liquids until the nausea and/or vomiting subsides. Call your physician if vomiting continues.  Special Instructions/Symptoms: Your throat may feel dry or sore from the anesthesia or the breathing tube placed in your throat during surgery. If this causes discomfort, gargle with warm salt water. The discomfort should disappear within 24 hours.  If you had a scopolamine patch placed behind your ear for the management of post- operative nausea and/or vomiting:  1. The medication in the patch is effective for 72 hours, after which it should be removed.  Wrap patch in a tissue and discard in the trash. Wash hands thoroughly with soap and water. 2. You may remove the patch earlier than 72 hours if you experience unpleasant side effects which may include dry mouth, dizziness or visual disturbances. 3. Avoid touching the patch. Wash your hands with soap and water after contact with the patch.   Scrotal surgery postoperative instructions  Wound:  In most cases your incision will have absorbable sutures that will dissolve within the first 10-20 days. Some will fall out even earlier. Expect some redness as the sutures dissolved but this should occur only around the sutures. If there is generalized redness, especially with increasing pain or swelling, let us know. The scrotum will very likely get "black and blue" as the blood in the tissues spread.  Sometimes the whole scrotum will turn colors. The black and blue is followed by a yellow and brown color. In time, all the discoloration will go away. In some cases some firm swelling in the area of the testicle may persist for up to 4-6 weeks after the surgery and is considered normal in most cases.  Diet:  You may return to your normal diet within 24 hours following your surgery. You may note some mild nausea and possibly vomiting the first 6-8 hours following surgery. This is usually due to the side effects of anesthesia, and will disappear quite soon. I would suggest clear liquids and a very light meal the first evening following your surgery.  Activity:  Your physical activity should be restricted the first 48 hours. During that time you should remain relatively inactive, moving about only when necessary. During the first 7-10 days following surgery he should avoid lifting any heavy objects (anything greater than 15 pounds), and avoid strenuous exercise. If you work, ask [place] specifically about your restrictions, both for work and home. We will write a note to your employer if needed.  You should plan to wear a tight pair of jockey shorts or an athletic supporter for the first 4-5 days, even to sleep. This will keep the scrotum immobilized to some degree and keep the swelling down.  Ice packs should be placed on and off over the scrotum for the first 48 hours. Frozen peas or corn in a ZipLock bag can be frozen, used and re-frozen. Fifteen minutes on and 15 minutes off is a reasonable schedule. The ice is a good pain reliever and keeps  the swelling down.  Hygiene:  You may shower 48 hours after your surgery. Tub bathing should be restricted until the seventh day.          Medication:  You will be sent home with some type of pain medication. In many cases you will be sent home with a narcotic pain pill (hydrococone or oxycodone). If the pain is not too bad, you may take either Tylenol  (acetaminophen) or Advil (ibuprofen) which contain no narcotic agents, and might be tolerated a little better, with fewer side effects. If the pain medication you are sent home with does not control the pain, you will have to let us know. Some narcotic pain medications cannot be given or refilled by a phone call to a pharmacy.  Problems you should report to [place]:   Fever of 101.0 degrees Fahrenheit or greater.  Moderate or severe swelling under the skin incision or involving the scrotum.  Drug reaction such as hives, a rash, nausea or vomiting.

## 2016-09-17 NOTE — Op Note (Signed)
PATIENT:  Andrew [name]  PRE-OPERATIVE DIAGNOSIS: Right hydrocele  POST-OPERATIVE DIAGNOSIS:  Same  PROCEDURE:  Procedure(s): Right hydrocelectomy  SURGEON:  Claybon Jabs  INDICATION: Andrew [name] is a 50 year old male with an enlarging, symptomatic right hydrocele. Discussed the treatment options and he has elected to proceed with surgical management of his hydrocele.  ANESTHESIA:  General  EBL:  Minimal  DRAINS: None  LOCAL MEDICATIONS USED:  18 mL of 1/2 percent Marcaine with epinephrine  SPECIMEN:  None  DISPOSITION OF SPECIMEN:  N/A  Description of procedure: After informed consent the patient was brought to the major OR, placed on the table and administered general anesthesia. His genitalia was then sterilely prepped and draped. An official timeout was then performed.  A midline median raphae scrotal incision was then made and carried down over the right hydrocele. The tissue over the hydrocele was cleared using a combination of sharp and blunt technique. The hydrocele was then opened, drained of clear amber fluid and delivered through the incision. The excess hydrocele sac tissue was excised with the Bovie electrocautery and then I cauterized the edges. I then reapproximated the edges posteriorly behind the epididymis with a running, locking 3-0 chromic suture. The appendix testis was removed with the Bovie.   His testicle was then replaced in the normal anatomic position and his right hemiscrotum. I then closed the deep scrotal tissue with running 3-0 chromic suture in a locking fashion. I injected half percent Marcaine with epinephrine in the subcutaneous tissue and closed the skin with running 3-0 chromic. Neosporin, a sterile gauze dressing, fluff Kerlix and a scrotal support were applied. The patient tolerated the procedure well no intraoperative complications. Needle sponge and instrument counts were correct at the end of the operation.   PLAN OF CARE: Discharge  to home after PACU  PATIENT DISPOSITION:  PACU - hemodynamically stable.

## 2016-09-17 NOTE — Anesthesia Procedure Notes (Addendum)
Procedure Name: LMA Insertion Date/Time: 09/17/2016 10:10 AM Performed by: Justice Rocher Pre-anesthesia Checklist: Patient identified, Emergency Drugs available, Suction available and Patient being monitored Patient Re-evaluated:Patient Re-evaluated prior to inductionOxygen Delivery Method: Circle system utilized Preoxygenation: Pre-oxygenation with 100% oxygen Intubation Type: IV induction Ventilation: Mask ventilation without difficulty LMA: LMA inserted LMA Size: 5.0 Number of attempts: 1 Airway Equipment and Method: Bite block Placement Confirmation: positive ETCO2 Tube secured with: Tape Dental Injury: Teeth and Oropharynx as per pre-operative assessment  Comments: Pt upper body and shoulders ramped elevated with grey shoulder wedge support, PreO2 well prior to induction

## 2016-09-17 NOTE — Anesthesia Postprocedure Evaluation (Signed)
Anesthesia Post Note  Patient: Andrew [name]  Procedure(s) Performed: Procedure(s) (LRB): HYDROCELECTOMY ADULT (Right)  Patient location during evaluation: PACU Anesthesia Type: General Level of consciousness: awake and alert Pain management: pain level controlled Vital Signs Assessment: post-procedure vital signs reviewed and stable Respiratory status: spontaneous breathing, nonlabored ventilation and respiratory function stable Cardiovascular status: blood pressure returned to baseline and stable Postop Assessment: no signs of nausea or vomiting Anesthetic complications: no    Last Vitals:  Vitals:   09/17/16 1130 09/17/16 1145  BP: (!) 145/86 131/70  Pulse: 86 90  Resp: 13 19  Temp:      Last Pain:  Vitals:   09/17/16 1130  TempSrc:   PainSc: 0-No pain                 Nilda Simmer

## 2016-09-17 NOTE — Transfer of Care (Signed)
Immediate Anesthesia Transfer of Care Note  Patient: Andrew [name]  Procedure(s) Performed: Procedure(s) (LRB): HYDROCELECTOMY ADULT (Right)  Patient Location: PACU  Anesthesia Type: General  Level of Consciousness: awake, sedated, patient cooperative and responds to stimulation  Airway & Oxygen Therapy: Patient Spontanous Breathing and Patient connected to face mask oxygen  Post-op Assessment: Report given to PACU RN, Post -op Vital signs reviewed and stable and Patient moving all extremities  Post vital signs: Reviewed and stable  Complications: No apparent anesthesia complications

## 2016-09-18 ENCOUNTER — Encounter (HOSPITAL_BASED_OUTPATIENT_CLINIC_OR_DEPARTMENT_OTHER): Payer: Self-pay | Admitting: Urology

## 2017-01-11 ENCOUNTER — Ambulatory Visit (INDEPENDENT_AMBULATORY_CARE_PROVIDER_SITE_OTHER): Payer: 59 | Admitting: Cardiovascular Disease

## 2017-01-11 ENCOUNTER — Encounter: Payer: Self-pay | Admitting: Cardiovascular Disease

## 2017-01-11 ENCOUNTER — Encounter (INDEPENDENT_AMBULATORY_CARE_PROVIDER_SITE_OTHER): Payer: Self-pay

## 2017-01-11 VITALS — BP 128/76 | HR 63 | Ht 67.0 in | Wt 294.0 lb

## 2017-01-11 DIAGNOSIS — R072 Precordial pain: Secondary | ICD-10-CM

## 2017-01-11 DIAGNOSIS — I10 Essential (primary) hypertension: Secondary | ICD-10-CM | POA: Diagnosis not present

## 2017-01-11 NOTE — Patient Instructions (Signed)
Medication Instructions:  Your physician recommends that you continue on your current medications as directed. Please refer to the Current Medication list given to you today.   Labwork: none  Testing/Procedures: Your physician has requested that you have an exercise tolerance test. For further information please visit [email or web address]. Please also follow instruction sheet, as given.    Follow-Up: Your physician recommends that you schedule a follow-up appointment in: 12 months. Please call our office in about 9 months to schedule this appointment    Any Other Special Instructions Will Be Listed Below (If Applicable).     If you need a refill on your cardiac medications before your next appointment, please call your pharmacy.   

## 2017-01-11 NOTE — Progress Notes (Signed)
Chief Complaint  Patient presents with  . Follow-up    no complaints   History of Present Illness: 51 yo WM with history of HTN who is here today for cardiac follow up. I saw him in June 2012 to establish cardiology care given his strong FH of CAD. Both of his parents had premature CAD. At his first visit, he reported lack of energy and constant chest pressure.  Admitted to [facility] 04/13/11 with chest pain. Stress myoview 2012 with no ischemia. LVEF normal. His symptoms improved shortly after discharge. Exercise stress test August 2016 with no ischemia.   He is here today for follow up. He has had no exertional chest pain or pressure. He is very active. He exercises several days per week.   Primary Care Physician: Kathlen Brunswick, PA-C  Past Medical History:  Diagnosis Date  . Epididymal cyst    left  . Family history of premature CAD   . History of exercise intolerance    06-03-2015--  dr Angelena Form--  no ischemia or chest pain and no arrhythmias  . Hyperlipidemia   . Hypertension    cardiologist-  dr Angelena Form  . Left posterior fascicular block   . Obesity, Class III, BMI 40-49.9 (morbid obesity) ([place])   . Right hydrocele   . Wears glasses     Past Surgical History:  Procedure Laterality Date  . CARDIOVASCULAR STRESS TEST  04/23/2011   normal nuclear study w/ no ischemia (no change from previous exam)/  normal LV function and wall motion , ef 64%  . COLONOSCOPY  2012  . HERNIA REPAIR  infant  . HYDROCELE EXCISION Right 09/17/2016   Procedure: HYDROCELECTOMY ADULT;  Surgeon: Kathie Rhodes, MD;  [facility];  Service: Urology;  Laterality: Right;  . WISDOM TOOTH EXTRACTION  2002    Current Outpatient Prescriptions  Medication Sig Dispense Refill  . aspirin 81 MG tablet Take 81 mg by mouth daily.      Marland Kitchen lisinopril-hydrochlorothiazide (PRINZIDE,ZESTORETIC) 10-12.5 MG tablet Take 1 tablet by mouth daily. (Patient taking differently: Take 1  tablet by mouth every morning. ) 90 tablet 1   No current facility-administered medications for this visit.     Allergies  Allergen Reactions  . Testosterone Rash    SEVERE RASH WITH ITCHING (TOPICAL AND INJECTION)    Social History   Social History  . Marital status: Married    Spouse name: [name]  . Number of children: 1  . Years of education: College   Occupational History  . Boiler tech [employer]   Social History Main Topics  . Smoking status: Never Smoker  . Smokeless tobacco: Never Used  . Alcohol use 1.2 - 1.8 oz/week    2 - 3 Standard drinks or equivalent per week     Comment: occasionaly beer (12 pk per month)  . Drug use: No  . Sexual activity: Not on file   Other Topics Concern  . Not on file   Social History Narrative   [name]) and lives at home with his wife and one child.   Patient is working full-time.   Patient has a college education.   Patient is right-handed.   Patient drinks two cups of coffee daily.   Exercise 2 times a week  On treadmill    Family History  Problem Relation Age of Onset  . Heart attack Mother   . COPD Mother   . Heart disease Mother   .  Hyperlipidemia Mother   . Heart attack Father   . Heart disease Father   . Hyperlipidemia Father     Review of Systems:  As stated in the HPI and otherwise negative.   BP 128/76   Pulse 63   Ht 5\' 7"  (1.702 m)   Wt 294 lb (133.4 kg) Comment: patient has on steel toe boots  SpO2 98%   BMI 46.05 kg/m   Physical Examination: General: Well developed, well nourished, NAD  HEENT: OP clear, mucus membranes moist  SKIN: warm, dry. No rashes. Neuro: No focal deficits  Musculoskeletal: Muscle strength 5/5 all ext  Psychiatric: Mood and affect normal  Neck: No JVD, no carotid bruits, no thyromegaly, no lymphadenopathy.  Lungs:Clear bilaterally, no wheezes, rhonci, crackles Cardiovascular: Regular rate and rhythm. No murmurs, gallops or  rubs. Abdomen:Soft. Bowel sounds present. Non-tender.  Extremities: No lower extremity edema. Pulses are 2 + in the bilateral DP/PT.  EKG:  EKG is not ordered today. The ekg from November 2017 is reviewed and shows sinus rhythm.   Recent Labs: 08/17/2016: ALT 42; Platelets 206; TSH 1.11 09/17/2016: BUN 23; Creatinine, Ser 0.90; Hemoglobin 15.0; Potassium 4.0; Sodium 141   Lipid Panel    Component Value Date/Time   CHOL 172 08/17/2016 0847   TRIG 265 (H) 08/17/2016 0847   HDL 32 (L) 08/17/2016 0847   CHOLHDL 5.4 (H) 08/17/2016 0847   VLDL 53 (H) 08/17/2016 0847   LDLCALC 87 08/17/2016 0847     Wt Readings from Last 3 Encounters:  01/11/17 294 lb (133.4 kg)  09/17/16 293 lb 5 oz (133 kg)  08/17/16 294 lb (133.4 kg)     Other studies Reviewed: Additional studies/ records that were reviewed today include: . Review of the above records demonstrates:    Assessment and Plan:   1. Chest pain: He has had no recent chest pains. No exertional chest pressure or SOB. Normal stress myoview in 2012 and normal exercise stress test in 2016. We discussed a coronary CTA to assess for CAD given his FH but I dont think his insurance will cover this since he is asymptomatic. Will arrange repeat exercise stress test now.   2. HTN: BP controlled. No changes.   Current medicines are reviewed at length with the patient today.  The patient does not have concerns regarding medicines.  The following changes have been made:  no change  Labs/ tests ordered today include:   Orders Placed This Encounter  Procedures  . Exercise Tolerance Test    Disposition:   FU with me in 12  months  Signed, Lauree Chandler, MD 01/11/2017 12:05 PM    [address], [place], [place]  [address] Phone: [phone]; Fax: [phone]

## 2017-02-11 ENCOUNTER — Telehealth: Payer: Self-pay | Admitting: Family Medicine

## 2017-02-11 NOTE — Telephone Encounter (Signed)
Dr Everlene Farrier had referred pt to have a SLEEP STUDY AND BECAUSE OF INSURANCE HE WASN'T ABLE TO GO AND WANTED TO KNOW IF WE CAN REDO HIS REFERRAL TO HAVE TEST DONE

## 2017-02-11 NOTE — Telephone Encounter (Signed)
Last seen 07/2016 Will you refer

## 2017-02-12 ENCOUNTER — Other Ambulatory Visit: Payer: Self-pay | Admitting: Physician Assistant

## 2017-02-12 DIAGNOSIS — R0683 Snoring: Secondary | ICD-10-CM

## 2017-02-12 NOTE — Telephone Encounter (Signed)
I have placed referral. Philis Fendt, MS, PA-C 8:13 AM, 02/12/2017

## 2017-02-22 ENCOUNTER — Ambulatory Visit (INDEPENDENT_AMBULATORY_CARE_PROVIDER_SITE_OTHER): Payer: 59

## 2017-02-22 DIAGNOSIS — R072 Precordial pain: Secondary | ICD-10-CM | POA: Diagnosis not present

## 2017-02-22 DIAGNOSIS — I10 Essential (primary) hypertension: Secondary | ICD-10-CM

## 2017-02-22 LAB — EXERCISE TOLERANCE TEST
CHL CUP STRESS STAGE 1 GRADE: 0 %
CHL CUP STRESS STAGE 1 HR: 98 {beats}/min
CHL CUP STRESS STAGE 1 SPEED: 0 mph
CHL CUP STRESS STAGE 10 DBP: 84 mmHg
CHL CUP STRESS STAGE 10 HR: 105 {beats}/min
CHL CUP STRESS STAGE 10 SBP: 144 mmHg
CHL CUP STRESS STAGE 10 SPEED: 0 mph
CHL CUP STRESS STAGE 2 GRADE: 0 %
CHL CUP STRESS STAGE 2 HR: 88 {beats}/min
CHL CUP STRESS STAGE 2 SPEED: 0 mph
CHL CUP STRESS STAGE 3 GRADE: 0 %
CHL CUP STRESS STAGE 4 GRADE: 0 %
CHL CUP STRESS STAGE 4 SPEED: 1 mph
CHL CUP STRESS STAGE 5 DBP: 85 mmHg
CHL CUP STRESS STAGE 5 GRADE: 10 %
CHL CUP STRESS STAGE 5 HR: 113 {beats}/min
CHL CUP STRESS STAGE 5 SPEED: 1.7 mph
CHL CUP STRESS STAGE 6 DBP: 83 mmHg
CHL CUP STRESS STAGE 6 HR: 130 {beats}/min
CHL CUP STRESS STAGE 6 SBP: 202 mmHg
CHL CUP STRESS STAGE 7 DBP: 96 mmHg
CHL CUP STRESS STAGE 7 HR: 160 {beats}/min
CHL CUP STRESS STAGE 8 SPEED: 3.3 mph
CHL CUP STRESS STAGE 9 DBP: 75 mmHg
CHL CUP STRESS STAGE 9 GRADE: 0 %
CHL CUP STRESS STAGE 9 SPEED: 0 mph
CSEPED: 9 min
CSEPEW: 9.9 METS
CSEPPHR: 160 {beats}/min
Exercise duration (sec): 0 s
MPHR: 170 {beats}/min
Percent HR: 94 %
Percent of predicted max HR: 94 %
RPE: 15
Rest HR: 81 {beats}/min
Stage 1 DBP: 84 mmHg
Stage 1 SBP: 134 mmHg
Stage 10 Grade: 0 %
Stage 3 HR: 82 {beats}/min
Stage 3 Speed: 1 mph
Stage 4 HR: 79 {beats}/min
Stage 5 SBP: 183 mmHg
Stage 6 Grade: 12 %
Stage 6 Speed: 2.5 mph
Stage 7 Grade: 14 %
Stage 7 SBP: 210 mmHg
Stage 7 Speed: 3.3 mph
Stage 8 Grade: 14 %
Stage 8 HR: 160 {beats}/min
Stage 9 HR: 144 {beats}/min
Stage 9 SBP: 192 mmHg

## 2017-03-03 ENCOUNTER — Other Ambulatory Visit: Payer: Self-pay | Admitting: Physician Assistant

## 2017-03-14 ENCOUNTER — Encounter: Payer: Self-pay | Admitting: Neurology

## 2017-03-14 ENCOUNTER — Ambulatory Visit (INDEPENDENT_AMBULATORY_CARE_PROVIDER_SITE_OTHER): Payer: 59 | Admitting: Neurology

## 2017-03-14 VITALS — BP 126/79 | HR 69 | Ht 67.0 in | Wt 302.0 lb

## 2017-03-14 DIAGNOSIS — Z8639 Personal history of other endocrine, nutritional and metabolic disease: Secondary | ICD-10-CM

## 2017-03-14 DIAGNOSIS — Z87898 Personal history of other specified conditions: Secondary | ICD-10-CM

## 2017-03-14 DIAGNOSIS — R0683 Snoring: Secondary | ICD-10-CM | POA: Diagnosis not present

## 2017-03-14 DIAGNOSIS — Z7189 Other specified counseling: Secondary | ICD-10-CM | POA: Diagnosis not present

## 2017-03-14 DIAGNOSIS — R0681 Apnea, not elsewhere classified: Secondary | ICD-10-CM | POA: Diagnosis not present

## 2017-03-14 DIAGNOSIS — G4730 Sleep apnea, unspecified: Secondary | ICD-10-CM

## 2017-03-14 DIAGNOSIS — G4710 Hypersomnia, unspecified: Secondary | ICD-10-CM | POA: Diagnosis not present

## 2017-03-14 DIAGNOSIS — E669 Obesity, unspecified: Secondary | ICD-10-CM | POA: Diagnosis not present

## 2017-03-14 NOTE — Progress Notes (Signed)
SLEEP MEDICINE CLINIC   Provider:  Larey Seat, M D  Primary Care Physician:  Hillis Range   Referring Provider: Tereasa Coop, PA-C    Chief Complaint  Patient presents with  . New Patient (Initial Visit)    snoring, never had a sleep study, last seen here in 2015    HPI:  Andrew [name] is a 51 y.o. male , seen here as in a referral from PA. Clark for a new sleep evaluation.  I had the pleasure of seeing Mr. Andrew [name] over 3 years ago for the same symptoms and reasons that I see him today. At the time he ordered a sleep study but his insurance was not willing to pay for it. He has tried to postpone further evaluation but now feels that he has to get it done. He was seen on 08/17/2016 last by his primary care provider who refilled his antihypertensive medications. He encourage the patient to think about bariatric surgery and the patient has already had an appointment set up. The patient continues to work in a physical job and joined the [employer] for [employer] and cardio training with a final goal of weight loss.  He continues to present with a past medical history of allergies with respiratory impact, gastro-esophageal reflux disease, hyperlipidemia, hypertension on medication and obesity. He is not diabetic father and mother had heart disease and his mother had COPD and post parents also suffered from hyperlipidemia.His sister and mother had OSA and were on CPAP.   The patient is surgical history that includes wisdom teeth removal and hernia repair.  HPI:  03-02-2014  Dr. Everlene Farrier has followed this patient for many years. He had an episode of chest pain and was evaluated by cardiology. Given his family history of  cardiopulmonary disease , he was also asked to undergo a sleep study. However, due to financial reasons he was not able to do it at the time it was originally ordered. He was concerned about the costs. Dr Everlene Farrier was testing him for fatigue and sleepiness, and  found to have hypo-testosteroemia. He could not tolerate the testosterone supplements due to severe itching.    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 He aims for 10 PM and rises most morning at 6.30 , spontaneously , with a back up alarm. He is on call frequently, on average 7-8 hours of sleep.  No nocturia, no morning headaches, but a dry mouth  Loud snoring reported when on his back.  His bedroom is cool, quiet, dark.He shares the bed room with his wife.   Soc: ETOH 12 drinks a month, no tobacco use, caffeine - a lot, Coffee 4-6 a day. 3 diet cokes a week.  No pets , daughter is 66 and lives at home.    Review of Systems: Out of a complete 14 system review, the patient complains of only the following symptoms, and all other reviewed systems are negative.   The patient grew up with passive smoke exposure but has never smoked. He endorsed the Epworth sleepiness score today at 14 points out of 24 and he has fallen asleep at the wheel which urged this reevaluation now. His fatigue degree is 36. He is not depressed. He snores, he has apnea.   Social History  Social History  . Marital status: Married    Spouse name: [name]  . Number of children: 1  . Years of education: College   Occupational History  . Boiler tech [employer]   Social History Main Topics  . Smoking status: Never Smoker  . Smokeless tobacco: Never Used  . Alcohol use 1.2 - 1.8 oz/week    2 - 3 Standard drinks or equivalent per week     Comment: occasionaly beer (12 pk per month)  . Drug use: No  . Sexual activity: Not on file   Other Topics Concern  . Not on file   Social History Narrative   [name]) and lives at home with his wife and one child.   Patient is working full-time.   Patient has a  college education.   Patient is right-handed.   Patient drinks two cups of coffee daily.   Exercise 2 times a week  On treadmill    Family History  Problem Relation Age of Onset  . Heart attack Mother   . COPD Mother   . Heart disease Mother   . Hyperlipidemia Mother   . Heart attack Father   . Heart disease Father   . Hyperlipidemia Father     Past Medical History:  Diagnosis Date  . Epididymal cyst    left  . Family history of premature CAD   . History of exercise intolerance    06-03-2015--  dr Angelena Form--  no ischemia or chest pain and no arrhythmias  . Hyperlipidemia   . Hypertension    cardiologist-  dr Angelena Form  . Left posterior fascicular block   . Obesity, Class III, BMI 40-49.9 (morbid obesity) ([place])   . Right hydrocele   . Wears glasses     Past Surgical History:  Procedure Laterality Date  . CARDIOVASCULAR STRESS TEST  04/23/2011   normal nuclear study w/ no ischemia (no change from previous exam)/  normal LV function and wall motion , ef 64%  . COLONOSCOPY  2012  . HERNIA REPAIR  infant  . HYDROCELE EXCISION Right 09/17/2016   Procedure: HYDROCELECTOMY ADULT;  Surgeon: Kathie Rhodes, MD;  Location: [facility];  Service: Urology;  Laterality: Right;  . WISDOM TOOTH EXTRACTION  2002    Current Outpatient Prescriptions  Medication Sig Dispense Refill  . aspirin 81 MG tablet Take 81 mg by mouth daily.      Marland Kitchen lisinopril-hydrochlorothiazide (PRINZIDE,ZESTORETIC) 10-12.5 MG tablet TAKE 1 TABLET BY MOUTH DAILY. 90 tablet 1   No current facility-administered medications for this visit.     Allergies as of 03/14/2017 - Review Complete 03/14/2017  Allergen Reaction Noted  . Testosterone Rash 02/03/2013    Vitals: BP 126/79   Pulse 69   Ht 5\' 7"  (1.702 m)   Wt (!) 302 lb (137 kg)   BMI 47.30 kg/m  Last Weight:  Wt Readings from Last 1 Encounters:  03/14/17 (!) 302 lb (137 kg)   Last Height:   Ht Readings from Last 1 Encounters:    03/14/17 5\' 7"  (1.702 m)    Physical exam:  General: The patient is awake, alert and appears not in acute distress. The patient is well groomed. Head: Normocephalic, atraumatic. Neck is supple. Mallampati 5, neck circumference:21.5  inches , no jaw lock. No retrognathia -but crowded airway.  Cardiovascular:  Regular rate and rhythm , without  murmurs or carotid bruit, and without distended neck veins. Respiratory: Lungs are clear to  auscultation.Skin:  Without evidence of edema, or rashTrunk: BMI is severely elevated ( super obese at 60 ) . Posture is erect.  Neurologic exam : The patient is awake and alert, but struggles to stay awake- oriented to place and time.  There is a normal attention span & concentration ability.  Speech is fluent without  dysarthria, dysphonia or aphasia. Mood and affect are appropriate.  Cranial nerves:  taste and smell sense intact -Pupils are equal and briskly reactive to light. Funduscopic exam without  evidence of  edema. Extraocular movements in vertical and horizontal planes intact and without nystagmus. Visual fields by finger perimetry are intact. Hearing to finger rub intact. Facial sensation intact to fine touch. Facial motor strength is symmetric and tongue moves midline. I cannot appreciate the uvula.   Motor exam:  Normal tone and normal muscle bulk , symmetric  strength in all  Four extremities. Strong grip strength. Sensory:  Fine touch, pinprick and vibration were tested in all extremities.  Coordination: Rapid alternating movements in the fingers/hands is tested and normal.  Finger-to-nose maneuver tested and normal without evidence of ataxia, dysmetria or tremor.  Gait and station: Patient walks without assistive device . Deep tendon reflexes: in the  upper and lower extremities are symmetric and intact.   Assessment:  After physical and neurologic examination, review of laboratory studies, imaging, neurophysiology testing and pre-existing  records, assessment is  1) patient with multiple risk factors for OSA, by BMI and neck size, and by history of snoring and witnessed apnea.  2) Mr. Bolger has a family history of sudden cardiac death affecting his father at age 88. He has followed up with a cardiologist yearly and [facility] always had a good result. 3) for the first time the patient endorses a very high degree of sleepiness, he did not feel the same way 3 years ago when I last saw him.   I think that a sleep study is urgently needed also in preparation of bariatric surgery. He will have an appointment with his bariatric surgeon June 1. It would be lovely if he could do a split-night polysomnography before.  4) he is restless and his wife reports PLMs.  This cannot be appreciated in a HST>    Plan:  Treatment plan and additional workup :  1) patient is with [employer] , 3% score and AHI SPLIT at 15. Co2 needed.  Watch for PLMs.  preparation for bariatric surgery , BMI super obese,.    Helder Crisafulli, MD

## 2017-03-14 NOTE — Patient Instructions (Signed)

## 2017-04-02 ENCOUNTER — Other Ambulatory Visit (HOSPITAL_COMMUNITY): Payer: Self-pay | Admitting: Surgery

## 2017-04-05 ENCOUNTER — Encounter: Payer: Self-pay | Admitting: Physician Assistant

## 2017-04-05 ENCOUNTER — Ambulatory Visit (INDEPENDENT_AMBULATORY_CARE_PROVIDER_SITE_OTHER): Payer: 59 | Admitting: Physician Assistant

## 2017-04-05 VITALS — BP 164/104 | HR 78 | Temp 98.3°F | Resp 17 | Ht 68.5 in | Wt 302.0 lb

## 2017-04-05 DIAGNOSIS — W57XXXA Bitten or stung by nonvenomous insect and other nonvenomous arthropods, initial encounter: Secondary | ICD-10-CM

## 2017-04-05 DIAGNOSIS — R030 Elevated blood-pressure reading, without diagnosis of hypertension: Secondary | ICD-10-CM | POA: Diagnosis not present

## 2017-04-05 MED ORDER — DOXYCYCLINE HYCLATE 100 MG PO CAPS: 100.0000 mg | ORAL_CAPSULE | Freq: Two times a day (BID) | ORAL | 0 refills | Status: DC

## 2017-04-05 NOTE — Progress Notes (Signed)
THERMAN HUGHLETT  MRN: [id number] DOB: 1966/05/24  Subjective:  Andrew [name] is a 51 y.o. male seen in office today for a chief complaint of tick bite. 3 days ago at work, a person noticed something near the corner of his left eye at work. He went out to the car and looked in the mirror, saw that it was tick and pulled it off. After that, he started feeling very fatigued and overall "beat." He was having associated knee pain and diaphoreiss. Took two doses of amoxicillin he had left over from a prior incident and notes he feels 500% better. Denies rash, fore throat, fever, chills, nausea, vomiting, and weakness.   Review of Systems  Eyes: Negative for photophobia, pain and visual disturbance.  Respiratory: Negative for cough and shortness of breath.   Cardiovascular: Negative for chest pain and palpitations.  Gastrointestinal: Negative for abdominal pain and diarrhea.  Skin: Negative for rash.  Neurological: Negative for dizziness, light-headedness and headaches.      Patient Active Problem List   Diagnosis Date Noted  . Hydrocele, right 11/17/2015  . Snoring 03/02/2014  . Hypertension 05/13/2012  . Hypogonadism male 05/13/2012  . Chest pain 04/19/2011    Current Outpatient Prescriptions on File Prior to Visit  Medication Sig Dispense Refill  . aspirin 81 MG tablet Take 81 mg by mouth daily.      Marland Kitchen lisinopril-hydrochlorothiazide (PRINZIDE,ZESTORETIC) 10-12.5 MG tablet TAKE 1 TABLET BY MOUTH DAILY. 90 tablet 1   No current facility-administered medications on file prior to visit.     Allergies  Allergen Reactions  . Testosterone Rash    SEVERE RASH WITH ITCHING (TOPICAL AND INJECTION)     Objective:  BP (!) 164/104   Pulse 78   Temp 98.3 F (36.8 C) (Oral)   Resp 17   Ht 5' 8.5" (1.74 m)   Wt (!) 302 lb (137 kg)   SpO2 98%   BMI 45.25 kg/m   Physical Exam  Constitutional: He is oriented to person, place, and time and well-developed, well-nourished, and  in no distress.  HENT:  Head: Normocephalic and atraumatic.  Eyes: Conjunctivae are normal.    Neck: Normal range of motion.  Cardiovascular: Normal rate, regular rhythm, normal heart sounds and intact distal pulses.   Pulmonary/Chest: Effort normal and breath sounds normal.  Neurological: He is alert and oriented to person, place, and time. Gait normal.  Skin: Skin is warm and dry.  Psychiatric: Affect normal.  Vitals reviewed.    BP Readings from Last 3 Encounters:  04/05/17 (!) 164/104  03/14/17 126/79  01/11/17 128/76     Assessment and Plan :  1. Tick bite, initial encounter Due to patient's presentation, will treat empirically for tick borne illness. Instructed to follow up in 7 days if he is not feeling completely better as we will need to do further lab work at that time and possible extend his course of doxycycline. Given educational material on tick bites. Return sooner or seek care at ED if symptoms return or worsen.  - doxycycline (VIBRAMYCIN) 100 MG capsule; Take 1 capsule (100 mg total) by mouth 2 (two) times daily.  Dispense: 20 capsule; Refill: 0  2. Elevated blood pressure reading He is typically well controlled. This could be related to the tick bite. He is asymptomatic today. Instructed to check bp over the next few days and document these values. If consistently >140/90, return for further evaluation. Given strict ED precautions.    Tenna Delaine  PA-C  [facility] 04/05/2017 8:49 AM

## 2017-04-05 NOTE — Patient Instructions (Addendum)
For tick bite, take doxycycline twice daily for the next ten days. Common side effects are nausea and vomiting. You are also at increased risk for sun burn while on doxycycline so make sure you are using sunscreen while outside on this medication. If you are not feeling better in 7 days, please return for further testing and possible lengthening of your course of antibiotics.   In terms of elevated blood pressure, I would like you to check your blood pressure at least a couple times over the next week outside of the office and document these values. It is best if you check the blood pressure at different times in the day. Your goal is <140/90. If your values are consistently above this goal, please return to office for further evaluation. If you start to have chest pain, blurred vision, shortness of breath, severe headache, lower leg swelling, or nausea/vomiting please seek care immediately here or at the ED.    Tick Bite Information Introduction Ticks are insects that attach themselves to the skin. There are many types of ticks. Common types include wood ticks and deer ticks. Sometimes, ticks carry diseases that can make a person very ill. The most common places for ticks to attach themselves are the scalp, neck, armpits, waist, and groin. HOW CAN YOU PREVENT TICK BITES? Take these steps to help prevent tick bites when you are outdoors:  Wear long sleeves and long pants.  Wear white clothes so you can see ticks more easily.  Tuck your pant legs into your socks.  If walking on a trail, stay in the middle of the trail to avoid brushing against bushes.  Avoid walking through areas with long grass.  Put bug spray on all skin that is showing and along boot tops, pant legs, and sleeve cuffs.  Check clothes, hair, and skin often and before going inside.  Brush off any ticks that are not attached.  Take a shower or bath as soon as possible after being outdoors.  HOW SHOULD YOU REMOVE A  TICK? Ticks should be removed as soon as possible to help prevent diseases. 1. If latex gloves are available, put them on before trying to remove a tick. 2. Use tweezers to grasp the tick as close to the skin as possible. You may also use curved forceps or a tick removal tool. Grasp the tick as close to its head as possible. Avoid grasping the tick on its body. 3. Pull gently upward until the tick lets go. Do not twist the tick or jerk it suddenly. This may break off the tick's head or mouth parts. 4. Do not squeeze or crush the tick's body. This could force disease-carrying fluids from the tick into your body. 5. After the tick is removed, wash the bite area and your hands with soap and water or alcohol. 6. Apply a small amount of antiseptic cream or ointment to the bite site. 7. Wash any tools that were used.  Do not try to remove a tick by applying a hot match, petroleum jelly, or fingernail polish to the tick. These methods do not work. They may also increase the chances of disease being spread from the tick bite. WHEN SHOULD YOU SEEK HELP? Contact your health care provider if you are unable to remove a tick or if a part of the tick breaks off in the skin. After a tick bite, you need to watch for signs and symptoms of diseases that can be spread by ticks. Contact your health care  provider if you develop any of the following:  Fever.  Rash.  Redness and puffiness (swelling) in the area of the tick bite.  Tender, puffy lymph glands.  Watery poop (diarrhea).  Weight loss.  Cough.  Feeling more tired than normal (fatigue).  Muscle, joint, or bone pain.  Belly (abdominal) pain.  Headache.  Change in your level of consciousness.  Trouble walking or moving your legs.  Loss of feeling (numbness) in the legs.  Loss of movement (paralysis).  Shortness of breath.  Confusion.  Throwing up (vomiting) many times.  This information is not intended to replace advice given to  you by your health care provider. Make sure you discuss any questions you have with your health care provider. Document Released: 01/09/2010 Document Revised: 03/22/2016 Document Reviewed: 03/25/2013 Elsevier Interactive Patient Education  2018 [employer].    IF you received an x-ray today, you will receive an invoice from [facility]. Please contact [facility] at [phone] with questions or concerns regarding your invoice.   IF you received labwork today, you will receive an invoice from [facility]. Please contact [facility] at [phone] with questions or concerns regarding your invoice.   Our billing staff will not be able to assist you with questions regarding bills from these companies.  You will be contacted with the lab results as soon as they are available. The fastest way to get your results is to activate your My Chart account. Instructions are located on the last page of this paperwork. If you have not heard from [place] regarding the results in 2 weeks, please contact this office.

## 2017-04-10 ENCOUNTER — Ambulatory Visit (INDEPENDENT_AMBULATORY_CARE_PROVIDER_SITE_OTHER): Payer: 59 | Admitting: Neurology

## 2017-04-10 DIAGNOSIS — R0681 Apnea, not elsewhere classified: Secondary | ICD-10-CM

## 2017-04-10 DIAGNOSIS — Z8639 Personal history of other endocrine, nutritional and metabolic disease: Secondary | ICD-10-CM

## 2017-04-10 DIAGNOSIS — Z87898 Personal history of other specified conditions: Secondary | ICD-10-CM

## 2017-04-10 DIAGNOSIS — G4730 Sleep apnea, unspecified: Secondary | ICD-10-CM

## 2017-04-10 DIAGNOSIS — E669 Obesity, unspecified: Secondary | ICD-10-CM

## 2017-04-10 DIAGNOSIS — R0683 Snoring: Secondary | ICD-10-CM

## 2017-04-10 DIAGNOSIS — G4710 Hypersomnia, unspecified: Secondary | ICD-10-CM

## 2017-04-10 DIAGNOSIS — G4733 Obstructive sleep apnea (adult) (pediatric): Secondary | ICD-10-CM

## 2017-04-12 ENCOUNTER — Encounter: Payer: 59 | Attending: Surgery | Admitting: Registered"

## 2017-04-12 ENCOUNTER — Encounter: Payer: Self-pay | Admitting: Registered"

## 2017-04-12 DIAGNOSIS — Z6841 Body Mass Index (BMI) 40.0 and over, adult: Secondary | ICD-10-CM | POA: Insufficient documentation

## 2017-04-12 DIAGNOSIS — Z713 Dietary counseling and surveillance: Secondary | ICD-10-CM | POA: Diagnosis not present

## 2017-04-12 DIAGNOSIS — E669 Obesity, unspecified: Secondary | ICD-10-CM

## 2017-04-12 NOTE — Progress Notes (Signed)
Pre-Op Assessment Visit:  Pre-Operative Sleeve Gastrectomy Surgery  Medical Nutrition Therapy:  Appt start time: 9:20  End time:  10:20  Patient was seen on 04/12/2017 for Pre-Operative Nutrition Assessment. Assessment and letter of approval faxed to [facility] Surgery Bariatric Surgery Program coordinator on 04/12/2017.   Pt expectation of surgery: be healthier, improve knee pain  Pt expectation of Dietitian: answering questions  Start weight at [facility]: 296.8 BMI: 47.90   Pt states he does not care about weight, more concerned with how he feels. Pt states he drinks about 12 beers/month depending on if he goes out of town. 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   Per insurance, pt needs 6 SWL visits prior to surgery.    24 hr Dietary Recall: First Meal: [place]-grits, grilled chicken, egg, cheese, tomato biscuit Snack: none Second Meal: [employer]-chicken pecan salad, chicken nuggets or chili Snack: none Third Meal: grilled chicken Snack: fruit Beverages: coffee, water, diet coke (2-3/wk max)  Encouraged to engage in 150 minutes of moderate physical activity including cardiovascular and weight baring weekly  Handouts given during visit include:  . Pre-Op Goals . Bariatric Surgery Protein Shakes . Vitamin and Mineral Recommendations  During the appointment today the following Pre-Op Goals were reviewed with the patient: . Maintain or lose weight as instructed by your surgeon . Make healthy food choices . Begin to limit portion sizes . Limited concentrated sugars and fried foods . Keep fat/sugar in the single digits per serving on          food labels . Practice CHEWING your food  (aim for 30 chews per bite or until applesauce consistency) . Practice not drinking 15 minutes before, during, and 30 minutes after each meal/snack . Avoid all carbonated beverages  . Avoid/limit caffeinated  beverages  . Avoid all sugar-sweetened beverages . Consume 3 meals per day; eat every 3-5 hours . Make a list of non-food related activities . Aim for 64-100 ounces of FLUID daily  . Aim for at least 60-80 grams of PROTEIN daily . Look for a liquid protein source that contain ?15 g protein and ?5 g carbohydrate  (ex: shakes, drinks, shots) . Physical activity is an important part of a healthy lifestyle so keep it moving!  Follow diet recommendations listed below Energy and Macronutrient Recommendations: Calories: 1800 Carbohydrate: 200 Protein: 135 Fat: 50  Demonstrated degree of understanding via:  Teach Back   Teaching Method Utilized:  Visual Auditory Hands on  Barriers to learning/adherence to lifestyle change: none  Patient to call the Nutrition and Diabetes Education Services to enroll in Pre-Op and Post-Op Nutrition Education when surgery date is scheduled.

## 2017-04-19 ENCOUNTER — Encounter: Payer: Self-pay | Admitting: Registered"

## 2017-04-19 ENCOUNTER — Encounter: Payer: 59 | Admitting: Registered"

## 2017-04-19 DIAGNOSIS — E669 Obesity, unspecified: Secondary | ICD-10-CM

## 2017-04-19 NOTE — Patient Instructions (Addendum)
-   Add in snack options during the work day, where you're eating every 3-5 hours. Snack options such as P3, lunchable, fruit, protein shake, etc.   - Continue to chew well, at least 30 times per bite.

## 2017-04-19 NOTE — Progress Notes (Signed)
Appt start time: 8:00 end time: 8:15  Assessment: 1st SWL Appointment.   Start Wt at [facility]: 296.8 Wt: 295.3 BMI: 47.66   Pt arrives having lost 1.5 lbs from previous visit.   Pt states he has been eating slower and drinking protein shakes. Pt states his insurance changes Dec. 1 and wants to have everything done by then. Pt states he has "not been eating much bread". Pt likes to eat deer meat and hunt with his daughter.    MEDICATIONS: See list   DIETARY INTAKE:  24-hr recall:  B ( AM):  Grilled chicken biscuit, grits tomato Snk ( AM): none  L ( PM): skipped Snk ( PM): lunchable D ( PM): P3, deer meat Snk ( PM): none Beverages: water, gatorade, protein shake, Coffee  Usual physical activity: work  Diet to Follow: Calories: 1800 kcals Carbohydrate: 200g Protein: 135g Fat: 50g  Preferred Learning Style:   No preference indicated   Learning Readiness:   Ready  Change in progress     Nutritional Diagnosis:  [place]-3.3 Overweight/obesity related to past poor dietary habits and physical inactivity as evidenced by patient w/ planned sleeve gastrectomy surgery following dietary guidelines for continued weight loss.    Intervention:  Nutrition counseling for upcoming Bariatric Surgery.  Goals:  - Add in snack options during the work day, where you're eating every 3-5 hours. Snack options such as P3, lunchable, fruit, protein shake, etc.  - Continue to chew well, at least 30 times per bite.   Teaching Method Utilized:  Visual Auditory  Handouts given during visit include:  none  Barriers to learning/adherence to lifestyle change: none  Demonstrated degree of understanding via:  Teach Back   Monitoring/Evaluation:  Dietary intake, exercise, and body weight in 21 day(s).

## 2017-04-26 ENCOUNTER — Ambulatory Visit (HOSPITAL_COMMUNITY)
Admission: RE | Admit: 2017-04-26 | Discharge: 2017-04-26 | Disposition: A | Discharge status: Home / Self Care | Payer: 59 | Source: Ambulatory Visit | Attending: Surgery | Admitting: Surgery

## 2017-04-26 ENCOUNTER — Telehealth: Payer: Self-pay

## 2017-04-26 DIAGNOSIS — Z01810 Encounter for preprocedural cardiovascular examination: Secondary | ICD-10-CM | POA: Insufficient documentation

## 2017-04-26 DIAGNOSIS — I459 Conduction disorder, unspecified: Secondary | ICD-10-CM | POA: Insufficient documentation

## 2017-04-26 DIAGNOSIS — I498 Other specified cardiac arrhythmias: Secondary | ICD-10-CM | POA: Diagnosis not present

## 2017-04-26 NOTE — Addendum Note (Signed)
Addended by: Larey Seat on: 04/26/2017 11:48 AM   Modules accepted: Orders

## 2017-04-26 NOTE — Telephone Encounter (Signed)
Spoke with patient and gave him results of his sleep study. Informed him he was positive for sleep apnea.Explained order for Auto cpap  would be sent to [facility] and they would be contacting him to schedule an appointment. I answered all his question and he understood process.

## 2017-04-26 NOTE — Procedures (Signed)
[facility]  Neurolog[facility] Associate [address] [place], [place] [address] NAME: Andrew [name]             DOB: 15-May-1966 MEDICAL RECORD [id number]   DOS: 04/10/17 REFERRING PHYSICIAN: Philis Fendt, PA-C STUDY PERFORMED: HST HISTORY:  Patient reports he snores and presents with witnessed apnea, was tested positive for OSA years ago, but insurance would not pay for therapy.   He was seen on 08/17/2016 last by his primary care provider who refilled his antihypertensive medications. He encourage bariatric surgery . He has a medical history of allergies with respiratory impact, gastro-esophageal reflux disease, hyperlipidemia, hypertension on medication and superobesity. The patient grew up with passive smoke exposure but has never smoked. He endorsed the Epworth sleepiness score today at 14 points out of 24 and he has fallen asleep at the wheel which urged this reevaluation now. His fatigue degree is 36. BMI 47.2 He is not depressed.   STUDY RESULTS: Total Recording Time: 7h 37m Total Apnea/Hypopnea Index (AHI): 35.1/hr. Average Oxygen Saturation:  SpO2 92% Lowest Oxygen Saturation: SpO2 nadir at 74%. At or below 88% for 52 minutes. Average Heart Rate: 62 bpm ( 43 - 97 bpm )   IMPRESSION: Moderate severe OSA with prolonged hypoxemia.  RECOMMENDATION: CPAP titration with 1 hr. baseline and capnography. If insurance does not allow attended titration may offer auto CPAP 5-20 cm water with a mask of patient's choice.  I certify that I have reviewed the raw data recording prior to the issuance of this report in accordance with the standards of the American Academy of Sleep Medicine (AASM). Larey Seat, MD  04-25-2017 [facility] [place], AASM.

## 2017-04-30 ENCOUNTER — Telehealth: Payer: Self-pay | Admitting: Neurology

## 2017-04-30 NOTE — Telephone Encounter (Signed)
-----   Message from Lester Brockway, RN sent at 04/29/2017  7:35 AM EDT -----   ----- Message ----- From: Larey Seat, MD Sent: 04/26/2017  11:48 AM To: Lester [place], RN, Robin Lemmie Evens Hyler  Patient called by Justin Mend- result of moderate severe OSA with hypoxemia. Needs CPAP treatment. In preparation for bariatric surgery. CD

## 2017-04-30 NOTE — Telephone Encounter (Signed)
Andrew [name] had previously contacted the pt with sleep study results and the pt didn't have any questions in regards to that. I called pt today to set up the follow up apt. Apt was scheduled for Aug 15 2017 at 10:30am. I also explained to pt that company will be getting in touch with him to get the equipment set up. Pt verbalized understanding

## 2017-05-06 ENCOUNTER — Encounter: Payer: 59 | Attending: Surgery | Admitting: Skilled Nursing Facility1

## 2017-05-06 ENCOUNTER — Encounter: Payer: Self-pay | Admitting: Skilled Nursing Facility1

## 2017-05-06 DIAGNOSIS — E669 Obesity, unspecified: Secondary | ICD-10-CM

## 2017-05-06 DIAGNOSIS — Z713 Dietary counseling and surveillance: Secondary | ICD-10-CM | POA: Insufficient documentation

## 2017-05-06 DIAGNOSIS — Z6841 Body Mass Index (BMI) 40.0 and over, adult: Secondary | ICD-10-CM | POA: Diagnosis not present

## 2017-05-06 NOTE — Progress Notes (Signed)
Appt start time: 8:00 end time: 8:15  Assessment: 2nd SWL Appointment.   Start Wt at [facility]: 296.8 Wt: 297 BMI: 47.99   Pt arrives having gained 2 lbs from previous visit.   Pt states he has been eating slower and drinking protein shakes. Pt states his insurance changes Dec. 1 and wants to have everything done by then. Pt states he has "not been eating much bread". Pt likes to eat deer meat and hunt with his daughter.   Pts statements for his psychologist Dr. Ardath Sax: "he's out there, brought up politics which I think is inappropriate, was told get the hell out a here your making me crazy, dr. Ardath Sax asked what other surgery requires a psychologist visit? And answered Transgender surgery because they are similar, they are both removing parts."  Pt states he has been Eating slower and filling up quicker, so chewing more, his strategy being putting his fork down in between bites and looking at his phone.  MEDICATIONS: See list   DIETARY INTAKE:  24-hr recall:  B ( AM):  [employer] protein shake-----grilled chicken buscuit and grits from fast food Snk ( AM): fruit L ( PM): wendys salad and chicken nuggets Snk ( PM): lunchable---fruit D ( PM): protein shake  Snk ( PM): none Beverages: water, gatorade, protein shake, Coffee  Usual physical activity: work  Diet to Follow: Calories: 1800 kcals Carbohydrate: 200g Protein: 135g Fat: 50g  Preferred Learning Style:   No preference indicated   Learning Readiness:   Ready  Change in progress     Nutritional Diagnosis:  [place]-3.3 Overweight/obesity related to past poor dietary habits and physical inactivity as evidenced by patient w/ planned sleeve gastrectomy surgery following dietary guidelines for continued weight loss.    Intervention:  Nutrition counseling for upcoming Bariatric Surgery.  Goals:  -Work on more meals from home   Teaching Method Utilized:  Ship broker  Handouts given during visit include:  none  Barriers  to learning/adherence to lifestyle change: none  Demonstrated degree of understanding via:  Teach Back   Monitoring/Evaluation:  Dietary intake, exercise, and body weight in 21 day(s).

## 2017-05-10 ENCOUNTER — Encounter: Payer: 59 | Admitting: Emergency Medicine

## 2017-05-16 ENCOUNTER — Telehealth: Payer: Self-pay | Admitting: Neurology

## 2017-05-16 NOTE — Telephone Encounter (Signed)
For insurance reasons we had to move Andrew [name]'s apt up to Jul 30 2017 at 10:00am. Pt made aware and agreeable to the change in date. Will send a letter in mail to update him on the apt change.

## 2017-05-17 ENCOUNTER — Ambulatory Visit (INDEPENDENT_AMBULATORY_CARE_PROVIDER_SITE_OTHER): Payer: 59 | Admitting: Emergency Medicine

## 2017-05-17 ENCOUNTER — Encounter: Payer: Self-pay | Admitting: Emergency Medicine

## 2017-05-17 VITALS — BP 126/79 | HR 69 | Temp 97.4°F | Resp 16 | Ht 66.5 in | Wt 293.4 lb

## 2017-05-17 DIAGNOSIS — Z0001 Encounter for general adult medical examination with abnormal findings: Secondary | ICD-10-CM | POA: Diagnosis not present

## 2017-05-17 DIAGNOSIS — G4733 Obstructive sleep apnea (adult) (pediatric): Secondary | ICD-10-CM | POA: Diagnosis not present

## 2017-05-17 DIAGNOSIS — I10 Essential (primary) hypertension: Secondary | ICD-10-CM

## 2017-05-17 DIAGNOSIS — Z9989 Dependence on other enabling machines and devices: Secondary | ICD-10-CM

## 2017-05-17 NOTE — Patient Instructions (Addendum)
   IF you received an x-ray today, you will receive an invoice from [facility]. Please contact [facility] at [phone] with questions or concerns regarding your invoice.   IF you received labwork today, you will receive an invoice from [facility]. Please contact [facility] at [phone] with questions or concerns regarding your invoice.   Our billing staff will not be able to assist you with questions regarding bills from these companies.  You will be contacted with the lab results as soon as they are available. The fastest way to get your results is to activate your My Chart account. Instructions are located on the last page of this paperwork. If you have not heard from us regarding the results in 2 weeks, please contact this office.      Health Maintenance, Male A healthy lifestyle and preventive care is important for your health and wellness. Ask your health care provider about what schedule of regular examinations is right for you. What should I know about weight and diet? Eat a Healthy Diet  Eat plenty of vegetables, fruits, whole grains, low-fat dairy products, and lean protein.  Do not eat a lot of foods high in solid fats, added sugars, or salt.  Maintain a Healthy Weight Regular exercise can help you achieve or maintain a healthy weight. You should:  Do at least 150 minutes of exercise each week. The exercise should increase your heart rate and make you sweat (moderate-intensity exercise).  Do strength-training exercises at least twice a week.  Watch Your Levels of Cholesterol and Blood Lipids  Have your blood tested for lipids and cholesterol every 5 years starting at 51 years of age. If you are at high risk for heart disease, you should start having your blood tested when you are 51 years old. You may need to have your cholesterol levels checked more often if: ? Your lipid or cholesterol levels are high. ? You are older than 50 years of age. ? You  are at high risk for heart disease.  What should I know about cancer screening? Many types of cancers can be detected early and may often be prevented. Lung Cancer  You should be screened every year for lung cancer if: ? You are a current smoker who has smoked for at least 30 years. ? You are a former smoker who has quit within the past 15 years.  Talk to your health care provider about your screening options, when you should start screening, and how often you should be screened.  Colorectal Cancer  Routine colorectal cancer screening usually begins at 50 years of age and should be repeated every 5-10 years until you are 51 years old. You may need to be screened more often if early forms of precancerous polyps or small growths are found. Your health care provider may recommend screening at an earlier age if you have risk factors for colon cancer.  Your health care provider may recommend using home test kits to check for hidden blood in the stool.  A small camera at the end of a tube can be used to examine your colon (sigmoidoscopy or colonoscopy). This checks for the earliest forms of colorectal cancer.  Prostate and Testicular Cancer  Depending on your age and overall health, your health care provider may do certain tests to screen for prostate and testicular cancer.  Talk to your health care provider about any symptoms or concerns you have about testicular or prostate cancer.  Skin Cancer  Check your skin   your skin from head to toe regularly.  Tell your health care provider about any new moles or changes in moles, especially if: ? There is a change in a mole's size, shape, or color. ? You have a mole that is larger than a pencil eraser.  Always use sunscreen. Apply sunscreen liberally and repeat throughout the day.  Protect yourself by wearing long sleeves, pants, a wide-brimmed hat, and sunglasses when outside.  What should I know about heart disease, diabetes, and high  blood pressure?  If you are 7-68 years of age, have your blood pressure checked every 3-5 years. If you are 60 years of age or older, have your blood pressure checked every year. You should have your blood pressure measured twice-once when you are at a hospital or clinic, and once when you are not at a hospital or clinic. Record the average of the two measurements. To check your blood pressure when you are not at a hospital or clinic, you can use: ? An automated blood pressure machine at a pharmacy. ? A home blood pressure monitor.  Talk to your health care provider about your target blood pressure.  If you are between 34-21 years old, ask your health care provider if you should take aspirin to prevent heart disease.  Have regular diabetes screenings by checking your fasting blood sugar level. ? If you are at a normal weight and have a low risk for diabetes, have this test once every three years after the age of 28. ? If you are overweight and have a high risk for diabetes, consider being tested at a younger age or more often.  A one-time screening for abdominal aortic aneurysm (AAA) by ultrasound is recommended for men aged 58-75 years who are current or former smokers. What should I know about preventing infection? Hepatitis B If you have a higher risk for hepatitis B, you should be screened for this virus. Talk with your health care provider to find out if you are at risk for hepatitis B infection. Hepatitis C Blood testing is recommended for:  Everyone born from [birth year] through 1965.  Anyone with known risk factors for hepatitis C.  Sexually Transmitted Diseases (STDs)  You should be screened each year for STDs including gonorrhea and chlamydia if: ? You are sexually active and are younger than 51 years of age. ? You are older than 51 years of age and your health care provider tells you that you are at risk for this type of infection. ? Your sexual activity has changed since you were  last screened and you are at an increased risk for chlamydia or gonorrhea. Ask your health care provider if you are at risk.  Talk with your health care provider about whether you are at high risk of being infected with HIV. Your health care provider may recommend a prescription medicine to help prevent HIV infection.  What else can I do?  Schedule regular health, dental, and eye exams.  Stay current with your vaccines (immunizations).  Do not use any tobacco products, such as cigarettes, chewing tobacco, and e-cigarettes. If you need help quitting, ask your health care provider.  Limit alcohol intake to no more than 2 drinks per day. One drink equals 12 ounces of beer, 5 ounces of wine, or 1 ounces of hard liquor.  Do not use street drugs.  Do not share needles.  Ask your health care provider for help if you need support or information about quitting drugs.  Tell your  provider if you often feel depressed.  Tell your health care provider if you have ever been abused or do not feel safe at home. This information is not intended to replace advice given to you by your health care provider. Make sure you discuss any questions you have with your health care provider. Document Released: 04/12/2008 Document Revised: 06/13/2016 Document Reviewed: 07/19/2015 Elsevier Interactive Patient Education  2018 [employer] Inc.  American Heart Association (AHA) Exercise Recommendation  Being physically active is important to prevent heart disease and stroke, the nation's No. 1and No. 5killers. To improve overall cardiovascular health, we suggest at least 150 minutes per week of moderate exercise or 75 minutes per week of vigorous exercise (or a combination of moderate and vigorous activity). Thirty minutes a day, five times a week is an easy goal to remember. You will also experience benefits even if you divide your time into two or three segments of 10 to 15 minutes per day.  For people who would  benefit from lowering their blood pressure or cholesterol, we recommend 40 minutes of aerobic exercise of moderate to vigorous intensity three to four times a week to lower the risk for heart attack and stroke.  Physical activity is anything that makes you move your body and burn calories.  This includes things like climbing stairs or playing sports. Aerobic exercises benefit your heart, and include walking, jogging, swimming or biking. Strength and stretching exercises are best for overall stamina and flexibility.  The simplest, positive change you can make to effectively improve your heart health is to start walking. It's enjoyable, free, easy, social and great exercise. A walking program is flexible and boasts high success rates because people can stick with it. It's easy for walking to become a regular and satisfying part of life.   For Overall Cardiovascular Health:  At least 30 minutes of moderate-intensity aerobic activity at least 5 days per week for a total of 150  OR   At least 25 minutes of vigorous aerobic activity at least 3 days per week for a total of 75 minutes; or a combination of moderate- and vigorous-intensity aerobic activity  AND   Moderate- to high-intensity muscle-strengthening activity at least 2 days per week for additional health benefits.  For Lowering Blood Pressure and Cholesterol  An average 40 minutes of moderate- to vigorous-intensity aerobic activity 3 or 4 times per week  What if I can't make it to the time goal? Something is always better than nothing! And everyone has to start somewhere. Even if you've been sedentary for years, today is the day you can begin to make healthy changes in your life. If you don't think you'll make it for 30 or 40 minutes, set a reachable goal for today. You can work up toward your overall goal by increasing your time as you get stronger. Don't let all-or-nothing thinking rob you of doing what you can every day.   Source:http://www.heart.org    

## 2017-05-17 NOTE — Progress Notes (Signed)
Andrew [name] 51 y.o.   Chief Complaint  Patient presents with  . Annual Exam    HISTORY OF PRESENT ILLNESS: This is a 51 y.o. male here for annual exam. Scheduled for Bariatric surgery soon; has seen Cardiologist; also seen Mental Therapist for pre-op psychological evaluation. Has no complaints.  HPI   Prior to Admission medications   Medication Sig Start Date End Date Taking? Authorizing Provider  aspirin 81 MG tablet Take 81 mg by mouth daily.     Yes [provider]  lisinopril-hydrochlorothiazide (PRINZIDE,ZESTORETIC) 10-12.5 MG tablet TAKE 1 TABLET BY MOUTH DAILY. 03/03/17  Yes Andrew Coop, PA-C    Allergies  Allergen Reactions  . Testosterone Rash    SEVERE RASH WITH ITCHING (TOPICAL AND INJECTION)    Patient Active Problem List   Diagnosis Date Noted  . Hydrocele, right 11/17/2015  . Snoring 03/02/2014  . Hypertension 05/13/2012  . Hypogonadism male 05/13/2012  . Chest pain 04/19/2011    Past Medical History:  Diagnosis Date  . Epididymal cyst    left  . Family history of premature CAD   . History of exercise intolerance    06-03-2015--  dr [name]--  no ischemia or chest pain and no arrhythmias  . Hyperlipidemia   . Hypertension    cardiologist-  dr [name]  . Left posterior fascicular block   . Obesity, Class III, BMI 40-49.9 (morbid obesity) ([name])   . Right hydrocele   . Wears glasses     Past Surgical History:  Procedure Laterality Date  . CARDIOVASCULAR STRESS TEST  04/23/2011   normal nuclear study w/ no ischemia (no change from previous exam)/  normal LV function and wall motion , ef 64%  . COLONOSCOPY  2012  . HERNIA REPAIR  infant  . HYDROCELE EXCISION Right 09/17/2016   Procedure: HYDROCELECTOMY ADULT;  Surgeon: Andrew Rhodes, MD;  Location: [name];  Service: Urology;  Laterality: Right;  . WISDOM TOOTH EXTRACTION  2002    Social History   Social History  . Marital status: Married    Spouse  name: Andrew [name]  . Number of children: 1  . Years of education: College   Occupational History  . Boiler [employer]   Social History Main Topics  . Smoking status: Never Smoker  . Smokeless tobacco: Never Used  . Alcohol use 1.2 - 1.8 oz/week    2 - 3 Standard drinks or equivalent per week     Comment: occasionaly beer (12 pk per month)  . Drug use: No  . Sexual activity: No   Other Topics Concern  . Not on file   Social History Narrative   Patient is married Andrew [name]) and lives at home with his wife and one child.   Patient is working full-time.   Patient has a college education.   Patient is right-handed.   Patient drinks two cups of coffee daily.   Exercise 2 times a week  On treadmill    Family History  Problem Relation Age of Onset  . Heart attack Mother   . COPD Mother   . Heart disease Mother   . Hyperlipidemia Mother   . Heart attack Father   . Heart disease Father   . Hyperlipidemia Father   . Hypertension Other      Review of Systems  Constitutional: Negative.  Negative for chills and fever.  HENT: Negative.   Eyes: Negative.   Respiratory: Negative.  Negative for cough and  shortness of breath.   Cardiovascular: Negative.  Negative for chest pain, palpitations and leg swelling.  Gastrointestinal: Negative.  Negative for abdominal pain, diarrhea, nausea and vomiting.  Genitourinary: Negative.   Musculoskeletal: Negative for back pain and neck pain.  Skin: Negative.  Negative for rash.  Neurological: Negative.  Negative for dizziness, sensory change, focal weakness and headaches.  Endo/Heme/Allergies: Negative.   All other systems reviewed and are negative.    Vitals:   05/17/17 1046  BP: 126/79  Pulse: 69  Resp: 16  Temp: (!) 97.4 F (36.3 C)     Physical Exam  Constitutional: He is oriented to person, place, and time. He appears well-developed.  Obese.  HENT:  Head: Normocephalic and atraumatic.  Right Ear: External ear  normal.  Left Ear: External ear normal.  Nose: Nose normal.  Mouth/Throat: Oropharynx is clear and moist.  Eyes: Pupils are equal, round, and reactive to light. Conjunctivae and EOM are normal.  Neck: Normal range of motion. Neck supple. No JVD present.  Cardiovascular: Normal rate, regular rhythm and normal heart sounds.   Pulmonary/Chest: Effort normal and breath sounds normal.  Abdominal: Soft. Bowel sounds are normal. He exhibits no distension. There is no tenderness.  Musculoskeletal: Normal range of motion. He exhibits no edema or tenderness.  Lymphadenopathy:    He has no cervical adenopathy.  Neurological: He is alert and oriented to person, place, and time. No sensory deficit. He exhibits normal muscle tone.  Skin: Skin is warm and dry. Capillary refill takes less than 2 seconds. No rash noted.  Psychiatric: He has a normal mood and affect. His behavior is normal.  Vitals reviewed.    ASSESSMENT & PLAN: Andrew [name] was seen today for annual exam.  Diagnoses and all orders for this visit:  Encounter for general adult medical examination with abnormal findings -     CBC with Differential -     Comprehensive metabolic panel -     Hemoglobin A1c -     Lipid panel -     TSH  Essential hypertension  OSA on CPAP  Morbid obesity ([place])    Patient Instructions       IF you received an x-ray today, you will receive an invoice from [name]. Please contact [name] at [phone] with questions or concerns regarding your invoice.   IF you received labwork today, you will receive an invoice from [facility]. Please contact [name] at [phone] with questions or concerns regarding your invoice.   Our billing staff will not be able to assist you with questions regarding bills from these companies.  You will be contacted with the lab results as soon as they are available. The fastest way to get your results is to activate your My Chart account.  Instructions are located on the last page of this paperwork. If you have not heard from [place] regarding the results in 2 weeks, please contact this office.        Health Maintenance, Male A healthy lifestyle and preventive care is important for your health and wellness. Ask your health care provider about what schedule of regular examinations is right for you. What should I know about weight and diet? Eat a Healthy Diet  Eat plenty of vegetables, fruits, whole grains, low-fat dairy products, and lean protein.  Do not eat a lot of foods high in solid fats, added sugars, or salt.  Maintain a Healthy Weight Regular exercise can help you achieve or maintain a healthy weight. You should:  Do at least 150 minutes of exercise each week. The exercise should increase your heart rate and make you sweat (moderate-intensity exercise).  Do strength-training exercises at least twice a week.  Watch Your Levels of Cholesterol and Blood Lipids  Have your blood tested for lipids and cholesterol every 5 years starting at 51 years of age. If you are at high risk for heart disease, you should start having your blood tested when you are 51 years old. You may need to have your cholesterol levels checked more often if: ? Your lipid or cholesterol levels are high. ? You are older than 51 years of age. ? You are at high risk for heart disease.  What should I know about cancer screening? Many types of cancers can be detected early and may often be prevented. Lung Cancer  You should be screened every year for lung cancer if: ? You are a current smoker who has smoked for at least 30 years. ? You are a former smoker who has quit within the past 15 years.  Talk to your health care provider about your screening options, when you should start screening, and how often you should be screened.  Colorectal Cancer  Routine colorectal cancer screening usually begins at 51 years of age and should be repeated every  5-10 years until you are 51 years old. You may need to be screened more often if early forms of precancerous polyps or small growths are found. Your health care provider may recommend screening at an earlier age if you have risk factors for colon cancer.  Your health care provider may recommend using home test kits to check for hidden blood in the stool.  A small camera at the end of a tube can be used to examine your colon (sigmoidoscopy or colonoscopy). This checks for the earliest forms of colorectal cancer.  Prostate and Testicular Cancer  Depending on your age and overall health, your health care provider may do certain tests to screen for prostate and testicular cancer.  Talk to your health care provider about any symptoms or concerns you have about testicular or prostate cancer.  Skin Cancer  Check your skin from head to toe regularly.  Tell your health care provider about any new moles or changes in moles, especially if: ? There is a change in a mole's size, shape, or color. ? You have a mole that is larger than a pencil eraser.  Always use sunscreen. Apply sunscreen liberally and repeat throughout the day.  Protect yourself by wearing long sleeves, pants, a wide-brimmed hat, and sunglasses when outside.  What should I know about heart disease, diabetes, and high blood pressure?  If you are 66-41 years of age, have your blood pressure checked every 3-5 years. If you are 3 years of age or older, have your blood pressure checked every year. You should have your blood pressure measured twice-once when you are at a hospital or clinic, and once when you are not at a hospital or clinic. Record the average of the two measurements. To check your blood pressure when you are not at a hospital or clinic, you can use: ? An automated blood pressure machine at a pharmacy. ? A home blood pressure monitor.  Talk to your health care provider about your target blood pressure.  If you are  between 73-8 years old, ask your health care provider if you should take aspirin to prevent heart disease.  Have regular diabetes screenings by checking your fasting blood  sugar level. ? If you are at a normal weight and have a low risk for diabetes, have this test once every three years after the age of 40. ? If you are overweight and have a high risk for diabetes, consider being tested at a younger age or more often.  A one-time screening for abdominal aortic aneurysm (AAA) by ultrasound is recommended for men aged 35-75 years who are current or former smokers. What should I know about preventing infection? Hepatitis B If you have a higher risk for hepatitis B, you should be screened for this virus. Talk with your health care provider to find out if you are at risk for hepatitis B infection. Hepatitis C Blood testing is recommended for:  Everyone born from [birth year] through 1965.  Anyone with known risk factors for hepatitis C.  Sexually Transmitted Diseases (STDs)  You should be screened each year for STDs including gonorrhea and chlamydia if: ? You are sexually active and are younger than 51 years of age. ? You are older than 51 years of age and your health care provider tells you that you are at risk for this type of infection. ? Your sexual activity has changed since you were last screened and you are at an increased risk for chlamydia or gonorrhea. Ask your health care provider if you are at risk.  Talk with your health care provider about whether you are at high risk of being infected with HIV. Your health care provider may recommend a prescription medicine to help prevent HIV infection.  What else can I do?  Schedule regular health, dental, and eye exams.  Stay current with your vaccines (immunizations).  Do not use any tobacco products, such as cigarettes, chewing tobacco, and e-cigarettes. If you need help quitting, ask your health care provider.  Limit alcohol intake to no  more than 2 drinks per day. One drink equals 12 ounces of beer, 5 ounces of wine, or 1 ounces of hard liquor.  Do not use street drugs.  Do not share needles.  Ask your health care provider for help if you need support or information about quitting drugs.  Tell your health care provider if you often feel depressed.  Tell your health care provider if you have ever been abused or do not feel safe at home. This information is not intended to replace advice given to you by your health care provider. Make sure you discuss any questions you have with your health care provider. Document Released: 04/12/2008 Document Revised: 06/13/2016 Document Reviewed: 07/19/2015 Elsevier Interactive Patient Education  2018 [place] (AHA) Exercise Recommendation  Being physically active is important to prevent heart disease and stroke, the nation's No. 1and No. 5killers. To improve overall cardiovascular health, we suggest at least 150 minutes per week of moderate exercise or 75 minutes per week of vigorous exercise (or a combination of moderate and vigorous activity). Thirty minutes a day, five times a week is an easy goal to remember. You will also experience benefits even if you divide your time into two or three segments of 10 to 15 minutes per day.  For people who would benefit from lowering their blood pressure or cholesterol, we recommend 40 minutes of aerobic exercise of moderate to vigorous intensity three to four times a week to lower the risk for heart attack and stroke.  Physical activity is anything that makes you move your body and burn calories.  This includes things like climbing stairs or playing  sports. Aerobic exercises benefit your heart, and include walking, jogging, swimming or biking. Strength and stretching exercises are best for overall stamina and flexibility.  The simplest, positive change you can make to effectively improve your heart health is to  start walking. It's enjoyable, free, easy, social and great exercise. A walking program is flexible and boasts high success rates because people can stick with it. It's easy for walking to become a regular and satisfying part of life.   For Overall Cardiovascular Health:  At least 30 minutes of moderate-intensity aerobic activity at least 5 days per week for a total of 150  OR   At least 25 minutes of vigorous aerobic activity at least 3 days per week for a total of 75 minutes; or a combination of moderate- and vigorous-intensity aerobic activity  AND   Moderate- to high-intensity muscle-strengthening activity at least 2 days per week for additional health benefits.  For Lowering Blood Pressure and Cholesterol  An average 40 minutes of moderate- to vigorous-intensity aerobic activity 3 or 4 times per week  What if I can't make it to the time goal? Something is always better than nothing! And everyone has to start somewhere. Even if you've been sedentary for years, today is the day you can begin to make healthy changes in your life. If you don't think you'll make it for 30 or 40 minutes, set a reachable goal for today. You can work up toward your overall goal by increasing your time as you get stronger. Don't let all-or-nothing thinking rob you of doing what you can every day.  Source:[email or web address] MD [facility]

## 2017-05-18 LAB — CBC WITH DIFFERENTIAL/PLATELET
BASOS: 0 %
Basophils Absolute: 0 10*3/uL (ref 0.0–0.2)
EOS (ABSOLUTE): 0.1 10*3/uL (ref 0.0–0.4)
EOS: 2 %
HEMATOCRIT: 47 % (ref 37.5–51.0)
HEMOGLOBIN: 16 g/dL (ref 13.0–17.7)
Immature Grans (Abs): 0 10*3/uL (ref 0.0–0.1)
Immature Granulocytes: 0 %
LYMPHS ABS: 1.5 10*3/uL (ref 0.7–3.1)
Lymphs: 29 %
MCH: 32.3 pg (ref 26.6–33.0)
MCHC: 34 g/dL (ref 31.5–35.7)
MCV: 95 fL (ref 79–97)
MONOS ABS: 0.3 10*3/uL (ref 0.1–0.9)
Monocytes: 6 %
NEUTROS ABS: 3.1 10*3/uL (ref 1.4–7.0)
Neutrophils: 63 %
Platelets: 221 10*3/uL (ref 150–379)
RBC: 4.96 x10E6/uL (ref 4.14–5.80)
RDW: 13.4 % (ref 12.3–15.4)
WBC: 5 10*3/uL (ref 3.4–10.8)

## 2017-05-18 LAB — COMPREHENSIVE METABOLIC PANEL
A/G RATIO: 1.7 (ref 1.2–2.2)
ALBUMIN: 4.6 g/dL (ref 3.5–5.5)
ALK PHOS: 60 IU/L (ref 39–117)
ALT: 37 IU/L (ref 0–44)
AST: 30 IU/L (ref 0–40)
BUN / CREAT RATIO: 29 — AB (ref 9–20)
BUN: 25 mg/dL — ABNORMAL HIGH (ref 6–24)
Bilirubin Total: 0.8 mg/dL (ref 0.0–1.2)
CO2: 25 mmol/L (ref 20–29)
CREATININE: 0.86 mg/dL (ref 0.76–1.27)
Calcium: 9.4 mg/dL (ref 8.7–10.2)
Chloride: 98 mmol/L (ref 96–106)
GFR calc Af Amer: 117 mL/min/{1.73_m2} (ref >59)
GFR calc non Af Amer: 101 mL/min/{1.73_m2} (ref >59)
GLOBULIN, TOTAL: 2.7 g/dL (ref 1.5–4.5)
Glucose: 104 mg/dL — ABNORMAL HIGH (ref 65–99)
POTASSIUM: 4.4 mmol/L (ref 3.5–5.2)
SODIUM: 138 mmol/L (ref 134–144)
Total Protein: 7.3 g/dL (ref 6.0–8.5)

## 2017-05-18 LAB — LIPID PANEL
CHOL/HDL RATIO: 5.3 ratio — AB (ref 0.0–5.0)
Cholesterol, Total: 164 mg/dL (ref 100–199)
HDL: 31 mg/dL — AB (ref >39)
LDL Calculated: 75 mg/dL (ref 0–99)
Triglycerides: 288 mg/dL — ABNORMAL HIGH (ref 0–149)
VLDL CHOLESTEROL CAL: 58 mg/dL — AB (ref 5–40)

## 2017-05-18 LAB — TSH: TSH: 1.24 u[IU]/mL (ref 0.450–4.500)

## 2017-05-18 LAB — HEMOGLOBIN A1C
Est. average glucose Bld gHb Est-mCnc: 126 mg/dL
Hgb A1c MFr Bld: 6 % — ABNORMAL HIGH (ref 4.8–5.6)

## 2017-05-31 ENCOUNTER — Encounter: Payer: 59 | Attending: Surgery | Admitting: Registered"

## 2017-05-31 DIAGNOSIS — Z6841 Body Mass Index (BMI) 40.0 and over, adult: Secondary | ICD-10-CM | POA: Diagnosis not present

## 2017-05-31 DIAGNOSIS — E669 Obesity, unspecified: Secondary | ICD-10-CM

## 2017-05-31 DIAGNOSIS — Z713 Dietary counseling and surveillance: Secondary | ICD-10-CM | POA: Insufficient documentation

## 2017-05-31 NOTE — Progress Notes (Signed)
Appt start time: 8:08 end time: 8:28  Assessment: 3rd SWL Appointment.   Start Wt at NDES: 296.8 Wt: 295.5 BMI: 46.98   Pt arrives having lost 1.5 lbs from previous visit. Pt states he has been out of town for the past 2 weeks. Pt states he has been cooking at home for 2 weeks prior to going out of town.   Pt states he has been eating slower and drinking protein shakes. Pt states his insurance changes Dec. 1 and wants to have everything done by then. Pt states he has "not been eating much bread". Pt likes to eat deer meat and hunt with his daughter.   Pts statements for his psychologist Dr. Ardath Sax: "he's out there, brought up politics which I think is inappropriate, was told get the hell out a here your making me crazy, dr. Ardath Sax asked what other surgery requires a psychologist visit? And answered Transgender surgery because they are similar, they are both removing parts."  Pt states he has been Eating slower and filling up quicker, so chewing more, his strategy being putting his fork down in between bites and looking at his phone.    MEDICATIONS: See list   DIETARY INTAKE:  24-hr recall:  B ( AM):  [place]   Snk ( AM): none L ( PM): [employer]-salad with boneless chicken fingers  Snk ( PM): none D ( PM): [employer]-chicken sandwich   Snk ( PM): none Beverages: water, gatorade, coffee  Usual physical activity: work  Diet to Follow: Calories: 1800 kcals Carbohydrate: 200g Protein: 135g Fat: 50g  Preferred Learning Style:   No preference indicated   Learning Readiness:   Ready  Change in progress     Nutritional Diagnosis:  [place]-3.3 Overweight/obesity related to past poor dietary habits and physical inactivity as evidenced by patient w/ planned sleeve gastrectomy surgery following dietary guidelines for continued weight loss.    Intervention:  Nutrition counseling for upcoming Bariatric Surgery.  Goals:  - Aim to not drink 15 min before eating, not while eating, and  waiting 30 min after eating. - Continue to work on chewing well and preparing meals at home.  Teaching Method Utilized:  Visual Auditory  Handouts given during visit include:  none  Barriers to learning/adherence to lifestyle change: none  Demonstrated degree of understanding via:  Teach Back   Monitoring/Evaluation:  Dietary intake, exercise, and body weight in 21 day(s).

## 2017-05-31 NOTE — Patient Instructions (Addendum)
-   Aim to not drink 15 min before eating, not while eating, and waiting 30 min after eating.  - Continue to work on chewing well and preparing meals at home.

## 2017-06-24 ENCOUNTER — Encounter: Payer: Self-pay | Admitting: Skilled Nursing Facility1

## 2017-06-24 ENCOUNTER — Encounter: Payer: 59 | Admitting: Skilled Nursing Facility1

## 2017-06-24 NOTE — Progress Notes (Signed)
Appt start time: 8:08 end time: 8:28  Assessment: 4th SWL Appointment.   Start Wt at NDES: 296.8 Wt: 303 BMI: 48.91  Pt arrives having gained about 8 pounds. Pt states he was out of town a lot and ate out a lot. Pt asked about smoothies. Pt states he tries to make more meals at home when he is not out of town. Pt states he drinks the Gatorade when he is sweating at work.    MEDICATIONS: See list   DIETARY INTAKE:  24-hr recall:  B ( AM):  [place]   Snk ( AM): none L ( PM): [employer]-salad with boneless chicken fingers  Snk ( PM): none D ( PM): [employer]-chicken sandwich   Snk ( PM): none Beverages: water, gatorade, coffee  Usual physical activity: work  Diet to Follow: Calories: 1800 kcals Carbohydrate: 200g Protein: 135g Fat: 50g  Preferred Learning Style:   No preference indicated   Learning Readiness:   Ready  Change in progress     Nutritional Diagnosis:  [place]-3.3 Overweight/obesity related to past poor dietary habits and physical inactivity as evidenced by patient w/ planned sleeve gastrectomy surgery following dietary guidelines for continued weight loss.    Intervention:  Nutrition counseling for upcoming Bariatric Surgery.  Goals:  -For your smoothie: no juice -When eating out do not order anything fried -Keep not drinking with meals -Keep chewing your food well -Keep drinking plenty of water -Work on decaf coffee -Keep up thinking more about your food choices   Teaching Method Utilized:  Visual Auditory  Handouts given during visit include:  none  Barriers to learning/adherence to lifestyle change: none  Demonstrated degree of understanding via:  Teach Back   Monitoring/Evaluation:  Dietary intake, exercise, and body weight in 21 day(s).

## 2017-06-24 NOTE — Patient Instructions (Addendum)
-  For your smoothie: no juice  -When eating out do not order anything fried  -Keep not drinking with meals  -Keep chewing your food well  -Keep drinking plenty of water  -Work on decaf coffee  -Keep up thinking more about your food choices

## 2017-07-22 ENCOUNTER — Encounter: Payer: 59 | Attending: Surgery | Admitting: Skilled Nursing Facility1

## 2017-07-22 ENCOUNTER — Encounter: Payer: Self-pay | Admitting: Skilled Nursing Facility1

## 2017-07-22 DIAGNOSIS — E669 Obesity, unspecified: Secondary | ICD-10-CM

## 2017-07-22 DIAGNOSIS — Z713 Dietary counseling and surveillance: Secondary | ICD-10-CM | POA: Diagnosis not present

## 2017-07-22 DIAGNOSIS — Z6841 Body Mass Index (BMI) 40.0 and over, adult: Secondary | ICD-10-CM | POA: Diagnosis not present

## 2017-07-22 NOTE — Progress Notes (Signed)
Appt start time: 8:08 end time: 8:28  Assessment: 5th SWL Appointment.   Start Wt at NDES: 296.8 Wt: 306 BMI: 49.39  Pt arrives having gained about 3 pounds. Pt states he was out of town a lot and ate out a lot. Pt asked about smoothies. Pt states he tries to make more meals at home when he is not out of town. Pt states he drinks the Gatorade when he is sweating at work.   Pt states he loves halloween. Pt states he worked 20.5 hours so eats out a lot.    MEDICATIONS: See list   DIETARY INTAKE:  24-hr recall:  B ( AM):  [place]   Snk ( AM): smoothie from the smoothie shop L ( PM): [employer]-salad with boneless chicken fingers  Snk ( PM): none D ( PM): [employer]-chicken sandwich   Snk ( PM): none Beverages: water, gatorade, coffee  Usual physical activity: work  Diet to Follow: Calories: 1800 kcals Carbohydrate: 200g Protein: 135g Fat: 50g  Preferred Learning Style:   No preference indicated   Learning Readiness:   Ready  Change in progress     Nutritional Diagnosis:  [place]-3.3 Overweight/obesity related to past poor dietary habits and physical inactivity as evidenced by patient w/ planned sleeve gastrectomy surgery following dietary guidelines for continued weight loss.    Intervention:  Nutrition counseling for upcoming Bariatric Surgery.  Goals:  To bring to work: -cherry tomatoes -baby carrots or carrot dimes or carrot sticks -deer jerky -celery -grapes -apples -peanutbutter -crackers -a small bag of chips -diced melon -peaches -pears -nuts -cheese Meals: Sandwiches Wraps Salads example: southwest: with corn, tortilla strips, black beans, spinach -Bring a cooler full of meals and snacks to work with you.   Teaching Method Utilized:  Visual Auditory  Handouts given during visit include:  none  Barriers to learning/adherence to lifestyle change: none  Demonstrated degree of understanding via:  Teach Back   Monitoring/Evaluation:  Dietary  intake, exercise, and body weight in 21 day(s).

## 2017-07-22 NOTE — Patient Instructions (Addendum)
To bring to work:  -cherry tomatoes -baby carrots or carrot dimes or carrot sticks -deer jerky -celery -grapes -apples -peanutbutter -crackers -a small bag of chips -diced melon -peaches -pears -nuts -cheese  Meals: Sandwiches Wraps Salads example: southwest: with corn, tortilla strips, black beans, spinach

## 2017-07-30 ENCOUNTER — Ambulatory Visit (INDEPENDENT_AMBULATORY_CARE_PROVIDER_SITE_OTHER): Payer: 59 | Admitting: Neurology

## 2017-07-30 ENCOUNTER — Encounter: Payer: Self-pay | Admitting: Neurology

## 2017-07-30 VITALS — BP 138/85 | HR 70 | Ht 67.0 in | Wt 309.0 lb

## 2017-07-30 DIAGNOSIS — Z9989 Dependence on other enabling machines and devices: Secondary | ICD-10-CM | POA: Diagnosis not present

## 2017-07-30 DIAGNOSIS — G4733 Obstructive sleep apnea (adult) (pediatric): Secondary | ICD-10-CM | POA: Diagnosis not present

## 2017-07-30 NOTE — Patient Instructions (Signed)

## 2017-07-30 NOTE — Progress Notes (Signed)
SLEEP MEDICINE CLINIC   Provider:  Larey Seat, M D  Primary Care Physician:   Referring Provider: Tereasa Coop, PA-C    Chief Complaint  Patient presents with  . Follow-up    pt alone, rm 11. states CPAP working well    HPI: second of October 2018. I had the pleasure of following up with Mr. Andrew [name] on his recent sleep studies. He was diagnosed with obstructive sleep apnea by home sleep test on 04/10/2017, medical history was reviewed, Epworth sleepiness score was endorsed at 14 points. The patient's AHI was 35.1 SPO2 nadir was 74%, total desaturation time at or below 88% was 52 minutes. No tachybradycardia arrhythmia noted. I recommended a CPAP titration was baseline and capnography which was declined by his insurance. We therefore used an auto titrate CPAP.  A compliance download from outdoor titration CPAP was obtained, the AutoSet was between 5 and 20 cm water with 3 cm EPR, total user time on average 7 hours and 6 minutes, 100% compliance for days, 97% compliance bedtime. There was only one day with the patient still short of a four-hour mark. Residual AHI is 0.3. The patient's 95th percentile pressure was 12.6/ hr.  he does have some higher air leaks using a nasal pillow. He uses now a nasal mask , large mask. He has now plans for bariatric surgery- and the auto-titrator will come in handy. He feels better , more energized, sleeps more soundly and his wife is happy that snoring stopped. No more nocturia.    Andrew [name] is a 51 y.o. male , was seen here as in a referral from PA. Clark for a new sleep evaluation.  I had the pleasure of seeing Mr. Andrew [name] over 3 years ago for the same symptoms and reasons that I see him today. At the time he ordered a sleep study but his insurance was not willing to pay for it. He has tried to postpone further evaluation but now feels that he has to get it done. He was seen on 08/17/2016 last by his primary care provider who  refilled his antihypertensive medications. He encourage the patient to think about bariatric surgery and the patient has already had an appointment set up. The patient continues to work in a physical job and joined the [employer] for [employer] and cardio training with a final goal of weight loss.  He continues to present with a past medical history of allergies with respiratory impact, gastro-esophageal reflux disease, hyperlipidemia, hypertension on medication and obesity. He is not diabetic father and mother had heart disease and his mother had COPD and post parents also suffered from hyperlipidemia.His sister and mother had OSA and were on CPAP.   Mr. Andrew [name] has a family history of sudden cardiac death affecting his father at age 8. He has followed up with a cardiologist yearly.   for the first time the patient endorses a very high degree of sleepiness, he did not feel the same way 3 years ago when I last saw him.   I think that a sleep study is urgently needed also in preparation of bariatric surgery. He will have an appointment with his bariatric surgeon June 1. It would be lovely if he could do a split-night polysomnography before.   he is restless and his wife reports PLMs.  This cannot be appreciated in a HST>    The patient is surgical history that includes wisdom teeth removal and hernia repair.  HPI:  03-02-2014 -Dr.  Daub has followed this patient for many years. He had an episode of chest pain and was evaluated by cardiology. Given his family history of  cardiopulmonary disease , he was also asked to undergo a sleep study. However, due to financial reasons he was not able to do it at the time it was originally ordered. He was concerned about the costs. Dr Everlene Farrier was testing him for fatigue and sleepiness, and found to have hypo-testosteroemia. He could not tolerate the testosterone supplements due to severe itching.    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 He aims for 10 PM and rises most morning at 6.30 , spontaneously , with a back up alarm. He is on call frequently, on average 7-8 hours of sleep.  No nocturia, no morning headaches, but a dry mouth  Loud snoring reported when on his back.  His bedroom is cool, quiet, dark.He shares the bed room with his wife.   Soc: ETOH 12 drinks a month, no tobacco use, caffeine - a lot, Coffee 4-6 a day. 3 diet cokes a week.  No pets , daughter is 21 and lives at home.    Review of Systems: Out of a complete 14 system review, the patient complains of only the following symptoms, and all other reviewed systems are negative.   The patient grew up with passive smoke exposure but has never smoked. He endorsed the Epworth sleepiness score today at 14 points out of 24 and he has fallen asleep at the wheel which urged this reevaluation now. His fatigue degree is 36. He is not depressed. He snores, he has apnea.   Social History   Social History  . Marital status: Married    Spouse name: [name]  . Number of children: 1  . Years of education: College   Occupational History  . Boiler tech [employer]   Social History Main Topics  . Smoking status: Never Smoker  . Smokeless tobacco: Never Used  . Alcohol use 1.2 - 1.8 oz/week    2 - 3 Standard drinks or equivalent per week     Comment: occasionaly beer (12 pk per month)  . Drug use: No  . Sexual activity: No   Other Topics Concern  . Not on file   Social History Narrative   [name]) and lives at home with his wife and one child.   Patient is working full-time.   Patient has a college education.   Patient is right-handed.   Patient drinks two cups of coffee daily.   Exercise 2 times a week  On treadmill    Family History  Problem Relation Age of Onset  . Heart  attack Mother   . COPD Mother   . Heart disease Mother   . Hyperlipidemia Mother   . Heart attack Father   . Heart disease Father   . Hyperlipidemia Father   . Hypertension Other     Past Medical History:  Diagnosis Date  . Epididymal cyst    left  . Family history of premature CAD   . History of exercise intolerance    06-03-2015--  dr Angelena Form--  no ischemia or chest pain and no arrhythmias  . Hyperlipidemia   . Hypertension    cardiologist-  dr Angelena Form  .  Left posterior fascicular block   . Obesity, Class III, BMI 40-49.9 (morbid obesity) ([place])   . Right hydrocele   . Wears glasses     Past Surgical History:  Procedure Laterality Date  . CARDIOVASCULAR STRESS TEST  04/23/2011   normal nuclear study w/ no ischemia (no change from previous exam)/  normal LV function and wall motion , ef 64%  . COLONOSCOPY  2012  . HERNIA REPAIR  infant  . HYDROCELE EXCISION Right 09/17/2016   Procedure: HYDROCELECTOMY ADULT;  Surgeon: Kathie Rhodes, MD;  Location: [facility];  Service: Urology;  Laterality: Right;  . WISDOM TOOTH EXTRACTION  2002    Current Outpatient Prescriptions  Medication Sig Dispense Refill  . aspirin 81 MG tablet Take 81 mg by mouth daily.      Marland Kitchen lisinopril-hydrochlorothiazide (PRINZIDE,ZESTORETIC) 10-12.5 MG tablet TAKE 1 TABLET BY MOUTH DAILY. 90 tablet 1   No current facility-administered medications for this visit.     Allergies as of 07/30/2017 - Review Complete 07/30/2017  Allergen Reaction Noted  . Testosterone Rash 02/03/2013    Vitals: BP 138/85   Pulse 70   Ht 5\' 7"  (1.702 m)   Wt (!) 309 lb (140.2 kg)   BMI 48.40 kg/m  Last Weight:  Wt Readings from Last 1 Encounters:  07/30/17 (!) 309 lb (140.2 kg)   Last Height:   Ht Readings from Last 1 Encounters:  07/30/17 5\' 7"  (1.702 m)    Physical exam:  General: The patient is awake, alert and appears not in acute distress. The patient is well groomed. Head:  Normocephalic, atraumatic. Neck is supple. Mallampati 5, neck circumference:21.5  inches , no jaw lock. No retrognathia -but crowded airway.  Cardiovascular:  Regular rate and rhythm , without  murmurs or carotid bruit, and without distended neck veins. Respiratory: Lungs are clear to auscultation.Skin:  Without evidence of edema, or rashTrunk: BMI is severely elevated ( super obese at 47 )  Posture is erect.  Neurologic exam :The patient is awake and alert, but struggles to stay awake- oriented to place and time.  There is a normal attention span & concentration ability. Speech is fluent without  dysarthria, dysphonia or aphasia. Mood and affect are appropriate.  Cranial nerves: taste and smell sense intact -Pupils are equal and briskly reactive to light.  Facial sensation intact to fine touch. Facial motor strength is symmetric and tongue moves midline. I cannot appreciate the uvula.  Motor exam:  Normal tone and normal muscle bulk , symmetric strength in all four extremities. Strong grip strength. Sensory:  Fine touch, pinprick and vibration were tested in all extremities.  Gait and station: Patient walks without assistive device . Deep tendon reflexes: in the upper and lower extremities are symmetric and intact.   Assessment:  After physical and neurologic examination, review of laboratory studies, imaging, neurophysiology testing and pre-existing records, assessment is  1) Patient with moderatly- severe  OSA, by BMI and neck size, and by history of snoring and witnessed apnea.     Plan:  Treatment plan and additional workup :  1) continue with auto-titration- 5-20 cm water, nasal mask of patient's choice.     Larey Seat, MD

## 2017-08-15 ENCOUNTER — Other Ambulatory Visit: Payer: Self-pay | Admitting: Physician Assistant

## 2017-08-15 ENCOUNTER — Ambulatory Visit: Payer: Self-pay | Admitting: Neurology

## 2017-08-19 ENCOUNTER — Encounter: Payer: Self-pay | Admitting: Skilled Nursing Facility1

## 2017-08-19 ENCOUNTER — Ambulatory Visit: Payer: 59 | Admitting: Skilled Nursing Facility1

## 2017-08-19 ENCOUNTER — Encounter: Payer: 59 | Attending: Surgery | Admitting: Skilled Nursing Facility1

## 2017-08-19 DIAGNOSIS — Z6841 Body Mass Index (BMI) 40.0 and over, adult: Secondary | ICD-10-CM | POA: Insufficient documentation

## 2017-08-19 DIAGNOSIS — Z713 Dietary counseling and surveillance: Secondary | ICD-10-CM | POA: Insufficient documentation

## 2017-08-19 NOTE — Progress Notes (Signed)
Appt start time: 8:08 end time: 8:28  Assessment: 6th SWL Appointment.   Start Wt at [facility]: 296.8 Wt: 302 BMI: 48.74  Pt arrives having lost about 4 pounds. Pt statrs he has accomplished eating his protein first but is still going to struggling with drinking with meals and getting enough fluid in throughout the day.   MEDICATIONS: See list   DIETARY INTAKE:  24-hr recall: 6-7 apples  B ( AM):  [place]   Snk ( AM): smoothie from the smoothie shop L ( PM): [employer]-salad with boneless chicken fingers  Snk ( PM): none D ( PM): [employer]-chicken sandwich   Snk ( PM): none Beverages: water, gatorade, coffee  Usual physical activity: work  Diet to Follow: Calories: 1800 kcals Carbohydrate: 200g Protein: 135g Fat: 50g  Preferred Learning Style:   No preference indicated   Learning Readiness:   Ready  Change in progress     Nutritional Diagnosis:  [place]-3.3 Overweight/obesity related to past poor dietary habits and physical inactivity as evidenced by patient w/ planned sleeve gastrectomy surgery following dietary guidelines for continued weight loss.    Intervention:  Nutrition counseling for upcoming Bariatric Surgery.  Goals:  -Work on Cabin crew on you throughout the day Teaching Method Utilized:  Ship broker  Handouts given during visit include:  none  Barriers to learning/adherence to lifestyle change: none  Demonstrated degree of understanding via:  Teach Back   Monitoring/Evaluation:  Dietary intake, exercise, and body weight in 21 day(s).

## 2017-08-20 ENCOUNTER — Encounter: Payer: Self-pay | Admitting: Skilled Nursing Facility1

## 2017-08-20 NOTE — Progress Notes (Signed)
Pre-Operative Nutrition Class:  Appt start time: 2725   End time:  1830.  Patient was seen on 08/19/2017 for Pre-Operative Bariatric Surgery Education at [facility].   Surgery date: November 6th Surgery type: Sleeve Start weight at [facility]: 296.8 Weight today: 302  Samples given per MNT protocol. Patient educated on appropriate usage: Lot # [id number] Exp: 07/19  Bariatric Advantage Calcium  Lot # [id number] Exp: sep-13-2019  Renee Pain Protein Shake Lot # [id number] Exp: may-27-19  The following the learning objectives were met by the patient during this course:  Identify Pre-Op Dietary Goals and will begin 2 weeks pre-operatively  Identify appropriate sources of fluids and proteins   State protein recommendations and appropriate sources pre and post-operatively  Identify Post-Operative Dietary Goals and will follow for 2 weeks post-operatively  Identify appropriate multivitamin and calcium sources  Describe the need for physical activity post-operatively and will follow MD recommendations  State when to call healthcare provider regarding medication questions or post-operative complications  Handouts given during class include:  Pre-Op Bariatric Surgery Diet Handout  Protein Shake Handout  Post-Op Bariatric Surgery Nutrition Handout  BELT Program Information Flyer  Support Group Information Flyer  [facility] Bariatric Supplements Price List  [facility]: [facility] per MD.

## 2017-09-10 ENCOUNTER — Encounter: Payer: Self-pay | Admitting: Registered"

## 2017-09-10 ENCOUNTER — Encounter: Payer: 59 | Attending: Surgery | Admitting: Registered"

## 2017-09-10 DIAGNOSIS — Z6841 Body Mass Index (BMI) 40.0 and over, adult: Secondary | ICD-10-CM | POA: Insufficient documentation

## 2017-09-10 DIAGNOSIS — E669 Obesity, unspecified: Secondary | ICD-10-CM

## 2017-09-10 DIAGNOSIS — Z713 Dietary counseling and surveillance: Secondary | ICD-10-CM | POA: Diagnosis not present

## 2017-09-10 NOTE — Patient Instructions (Addendum)
-   Keep up the great work with what you are already doing!

## 2017-09-10 NOTE — Progress Notes (Signed)
Appt start time: 8:05 end time: 8:18  Assessment: 7th SWL Appointment.   Start Wt at [facility]: 296.8 Wt: 298.3 BMI: 48.15   Pt arrives having lost about 3.7 pounds. Pt states he needed another visit because insurance says he had 2 visits within 1 month, although they were 21 days apart and he needed to have his vital signs taken.   Pt states he is doing better with not drinking with meals. Pt states he has been avoiding breads and starches, due to being on pre-op diet. Pt states he started pre-op diet because his original surgery date was 11/6 and now has been moved to 11/26. Pt states he is doing well with drinking more than 64 ounces of fluid; drinking plenty of water. Pt reports making changes from caffeinated to decaf coffee. Pt states he has been reading nutrition facts labels. Pt states he loves apples.    MEDICATIONS: See list   DIETARY INTAKE:  24-hr recall: 6-7 apples  B ( AM):  [place]   Snk ( AM): smoothie from the smoothie shop L ( PM): [employer]-salad with boneless chicken fingers  Snk ( PM): none D ( PM): [employer]-chicken sandwich   Snk ( PM): none Beverages: water, gatorade, coffee (decaf)  Usual physical activity: work  Diet to Follow: Calories: 1800 kcals Carbohydrate: 200g Protein: 135g Fat: 50g  Preferred Learning Style:   No preference indicated   Learning Readiness:   Ready  Change in progress     Nutritional Diagnosis:  [place]-3.3 Overweight/obesity related to past poor dietary habits and physical inactivity as evidenced by patient w/ planned sleeve gastrectomy surgery following dietary guidelines for continued weight loss.    Intervention:  Nutrition counseling for upcoming Bariatric Surgery.  Goals:  - Keep up the great work with what you are already doing!  Teaching Method Utilized:  Visual Auditory  Handouts given during visit include:  none  Barriers to learning/adherence to lifestyle change: none  Demonstrated degree of understanding  via:  Teach Back   Monitoring/Evaluation:  Dietary intake, exercise, and body weight prn.

## 2017-09-18 ENCOUNTER — Ambulatory Visit: Payer: 59 | Admitting: Skilled Nursing Facility1

## 2017-11-22 ENCOUNTER — Ambulatory Visit: Payer: 59 | Admitting: Emergency Medicine

## 2017-12-23 ENCOUNTER — Telehealth: Payer: Self-pay | Admitting: Neurology

## 2017-12-23 ENCOUNTER — Other Ambulatory Visit: Payer: Self-pay | Admitting: Physician Assistant

## 2017-12-23 NOTE — Telephone Encounter (Signed)
error 

## 2018-03-19 ENCOUNTER — Other Ambulatory Visit: Payer: Self-pay | Admitting: Physician Assistant

## 2018-03-28 ENCOUNTER — Other Ambulatory Visit: Payer: Self-pay | Admitting: Physician Assistant

## 2018-05-23 ENCOUNTER — Encounter: Payer: Self-pay | Admitting: Emergency Medicine

## 2018-05-23 ENCOUNTER — Ambulatory Visit (INDEPENDENT_AMBULATORY_CARE_PROVIDER_SITE_OTHER): Payer: BLUE CROSS/BLUE SHIELD | Admitting: Emergency Medicine

## 2018-05-23 ENCOUNTER — Ambulatory Visit (INDEPENDENT_AMBULATORY_CARE_PROVIDER_SITE_OTHER): Payer: BLUE CROSS/BLUE SHIELD

## 2018-05-23 VITALS — BP 106/76 | HR 74 | Temp 98.3°F | Resp 17 | Ht 68.0 in | Wt 297.0 lb

## 2018-05-23 DIAGNOSIS — I10 Essential (primary) hypertension: Secondary | ICD-10-CM | POA: Diagnosis not present

## 2018-05-23 DIAGNOSIS — E781 Pure hyperglyceridemia: Secondary | ICD-10-CM

## 2018-05-23 DIAGNOSIS — Z87898 Personal history of other specified conditions: Secondary | ICD-10-CM

## 2018-05-23 DIAGNOSIS — Z0000 Encounter for general adult medical examination without abnormal findings: Secondary | ICD-10-CM

## 2018-05-23 NOTE — Progress Notes (Addendum)
Andrew [name]. 52 y.o.   Chief Complaint  Patient presents with  . Annual Exam    HISTORY OF PRESENT ILLNESS: This is a 52 y.o. male Here for annual exam; no complaints and no medical concerns. Past medical history includes hypertension.  Compliant with medication.  Taking Zestoretic. Non-smoker and non-EtOH abuser. Has been working out regularly and eating better. Recent colonoscopy looked normal.  Wt Readings from Last 3 Encounters:  05/23/18 297 lb (134.7 kg)  09/10/17 298 lb 4.8 oz (135.3 kg)  08/20/17 (!) 302 lb (137 kg)   BP Readings from Last 3 Encounters:  05/23/18 (!) 145/84  07/30/17 138/85  05/17/17 126/79   The 10-year ASCVD risk score [name] DC Jr., et al., 2013) is: 3.2%   Values used to calculate the score:     Age: 58 years     Sex: Male     Is Non-Hispanic African American: No     Diabetic: No     Tobacco smoker: No     Systolic Blood Pressure: 025 mmHg     Is BP treated: Yes     HDL Cholesterol: 34 mg/dL     Total Cholesterol: 150 mg/dL  HPI   Prior to Admission medications   Medication Sig Start Date End Date Taking? Authorizing Provider  aspirin 81 MG tablet Take 81 mg by mouth daily.     Yes [provider]  lisinopril-hydrochlorothiazide (PRINZIDE,ZESTORETIC) 10-12.5 MG tablet TAKE 1 TABLET BY MOUTH DAILY. 03/28/18  Yes Tereasa Coop, PA-C    Allergies  Allergen Reactions  . Testosterone Rash    SEVERE RASH WITH ITCHING (TOPICAL AND INJECTION)    Patient Active Problem List   Diagnosis Date Noted  . Encounter for general adult medical examination with abnormal findings 05/17/2017  . OSA on CPAP 05/17/2017  . Morbid obesity ([place]) 05/17/2017  . Hydrocele, right 11/17/2015  . Snoring 03/02/2014  . Hypertension 05/13/2012  . Hypogonadism male 05/13/2012  . Chest pain 04/19/2011    Past Medical History:  Diagnosis Date  . Epididymal cyst    left  . Family history of premature CAD   . History of exercise  intolerance    06-03-2015--  dr Angelena Form--  no ischemia or chest pain and no arrhythmias  . Hyperlipidemia   . Hypertension    cardiologist-  dr Angelena Form  . Left posterior fascicular block   . Obesity, Class III, BMI 40-49.9 (morbid obesity) ([place])   . Right hydrocele   . Wears glasses     Past Surgical History:  Procedure Laterality Date  . CARDIOVASCULAR STRESS TEST  04/23/2011   normal nuclear study w/ no ischemia (no change from previous exam)/  normal LV function and wall motion , ef 64%  . COLONOSCOPY  2012  . HERNIA REPAIR  infant  . HYDROCELE EXCISION Right 09/17/2016   Procedure: HYDROCELECTOMY ADULT;  Surgeon: Kathie Rhodes, MD;  Location: [facility];  Service: Urology;  Laterality: Right;  . WISDOM TOOTH EXTRACTION  2002    Social History   Socioeconomic History  . Marital status: Married    Spouse name: [name]  . Number of children: 1  . Years of education: College  . Highest education level: Not on file  Occupational History  . Occupation: Therapist, occupational: [employer]  Social Needs  . Financial resource strain: Not on file  . Food insecurity:    Worry: Not on file  Inability: Not on file  . Transportation needs:    Medical: Not on file    Non-medical: Not on file  Tobacco Use  . Smoking status: Never Smoker  . Smokeless tobacco: Never Used  Substance and Sexual Activity  . Alcohol use: Yes    Alcohol/week: 1.2 - 1.8 oz    Types: 2 - 3 Standard drinks or equivalent per week    Comment: occasionaly beer (12 pk per month)  . Drug use: No  . Sexual activity: Never  Lifestyle  . Physical activity:    Days per week: Not on file    Minutes per session: Not on file  . Stress: Not on file  Relationships  . Social connections:    Talks on phone: Not on file    Gets together: Not on file    Attends religious service: Not on file    Active member of club or organization: Not on file    Attends meetings of clubs or  organizations: Not on file    Relationship status: Not on file  . Intimate partner violence:    Fear of current or ex partner: Not on file    Emotionally abused: Not on file    Physically abused: Not on file    Forced sexual activity: Not on file  Other Topics Concern  . Not on file  Social History Narrative   [name]) and lives at home with his wife and one child.   Patient is working full-time.   Patient has a college education.   Patient is right-handed.   Patient drinks two cups of coffee daily.   Exercise 2 times a week  On treadmill    Family History  Problem Relation Age of Onset  . Heart attack Mother   . COPD Mother   . Heart disease Mother   . Hyperlipidemia Mother   . Heart attack Father   . Heart disease Father   . Hyperlipidemia Father   . Hypertension Other      Review of Systems  Constitutional: Negative.  Negative for chills and fever.  HENT: Negative.  Negative for congestion, hearing loss, nosebleeds and sore throat.   Eyes: Negative.  Negative for blurred vision, double vision, discharge and redness.  Respiratory: Negative.  Negative for cough and shortness of breath.   Cardiovascular: Negative.  Negative for chest pain, palpitations and leg swelling.  Gastrointestinal: Negative.  Negative for abdominal pain, blood in stool, diarrhea, melena, nausea and vomiting.  Genitourinary: Negative.  Negative for dysuria and hematuria.  Musculoskeletal: Negative.  Negative for back pain, myalgias and neck pain.  Skin: Negative.  Negative for rash.  Neurological: Negative.  Negative for dizziness, sensory change, focal weakness and headaches.  Endo/Heme/Allergies: Negative.   All other systems reviewed and are negative.   Vitals:   05/23/18 0936 05/23/18 0957  BP: (!) 145/84 106/76  Pulse: 74   Resp: 17   Temp: 98.3 F (36.8 C)   SpO2: 98%     Physical Exam  Constitutional: He is oriented to person, place, and time. He appears  well-developed.  Obese.  HENT:  Head: Normocephalic and atraumatic.  Right Ear: External ear normal.  Left Ear: External ear normal.  Nose: Nose normal.  Mouth/Throat: Oropharynx is clear and moist.  Eyes: Pupils are equal, round, and reactive to light. Conjunctivae and EOM are normal.  Neck: Normal range of motion. Neck supple.  Cardiovascular: Normal rate, regular rhythm and normal heart sounds.  Pulmonary/Chest: Effort normal and breath sounds normal.  Abdominal: Soft. Bowel sounds are normal. He exhibits no distension. There is no tenderness.  Musculoskeletal: Normal range of motion. He exhibits no edema or tenderness.  Neurological: He is alert and oriented to person, place, and time. No sensory deficit. He exhibits normal muscle tone.  Skin: Skin is warm and dry. Capillary refill takes less than 2 seconds. No rash noted.  Psychiatric: He has a normal mood and affect. His behavior is normal.     ASSESSMENT & PLAN: Reise was seen today for annual exam.  Diagnoses and all orders for this visit:  Routine general medical examination at a health care facility -     Comprehensive metabolic panel -     Hemoglobin A1c -     Lipid panel -     PSA(Must document that pt has been informed of limitations of PSA testing.)  History of prediabetes  High triglycerides  Essential hypertension -     DG Chest 2 View; Future    Patient Instructions       IF you received an x-ray today, you will receive an invoice from [facility]. Please contact [facility] at [phone] with questions or concerns regarding your invoice.   IF you received labwork today, you will receive an invoice from [facility]. Please contact [facility] at [phone] with questions or concerns regarding your invoice.   Our billing staff will not be able to assist you with questions regarding bills from these companies.  You will be contacted with the lab results as soon as they are  available. The fastest way to get your results is to activate your My Chart account. Instructions are located on the last page of this paperwork. If you have not heard from [place] regarding the results in 2 weeks, please contact this office.       Health Maintenance, Male A healthy lifestyle and preventive care is important for your health and wellness. Ask your health care provider about what schedule of regular examinations is right for you. What should I know about weight and diet? Eat a Healthy Diet  Eat plenty of vegetables, fruits, whole grains, low-fat dairy products, and lean protein.  Do not eat a lot of foods high in solid fats, added sugars, or salt.  Maintain a Healthy Weight Regular exercise can help you achieve or maintain a healthy weight. You should:  Do at least 150 minutes of exercise each week. The exercise should increase your heart rate and make you sweat (moderate-intensity exercise).  Do strength-training exercises at least twice a week.  Watch Your Levels of Cholesterol and Blood Lipids  Have your blood tested for lipids and cholesterol every 5 years starting at 52 years of age. If you are at high risk for heart disease, you should start having your blood tested when you are 52 years old. You may need to have your cholesterol levels checked more often if: ? Your lipid or cholesterol levels are high. ? You are older than 52 years of age. ? You are at high risk for heart disease.  What should I know about cancer screening? Many types of cancers can be detected early and may often be prevented. Lung Cancer  You should be screened every year for lung cancer if: ? You are a current smoker who has smoked for at least 30 years. ? You are a former smoker who has quit within the past 15 years.  Talk to your health care provider  about your screening options, when you should start screening, and how often you should be screened.  Colorectal Cancer  Routine colorectal  cancer screening usually begins at 52 years of age and should be repeated every 5-10 years until you are 52 years old. You may need to be screened more often if early forms of precancerous polyps or small growths are found. Your health care provider may recommend screening at an earlier age if you have risk factors for colon cancer.  Your health care provider may recommend using home test kits to check for hidden blood in the stool.  A small camera at the end of a tube can be used to examine your colon (sigmoidoscopy or colonoscopy). This checks for the earliest forms of colorectal cancer.  Prostate and Testicular Cancer  Depending on your age and overall health, your health care provider may do certain tests to screen for prostate and testicular cancer.  Talk to your health care provider about any symptoms or concerns you have about testicular or prostate cancer.  Skin Cancer  Check your skin from head to toe regularly.  Tell your health care provider about any new moles or changes in moles, especially if: ? There is a change in a mole's size, shape, or color. ? You have a mole that is larger than a pencil eraser.  Always use sunscreen. Apply sunscreen liberally and repeat throughout the day.  Protect yourself by wearing long sleeves, pants, a wide-brimmed hat, and sunglasses when outside.  What should I know about heart disease, diabetes, and high blood pressure?  If you are 32-66 years of age, have your blood pressure checked every 3-5 years. If you are 25 years of age or older, have your blood pressure checked every year. You should have your blood pressure measured twice-once when you are at a hospital or clinic, and once when you are not at a hospital or clinic. Record the average of the two measurements. To check your blood pressure when you are not at a hospital or clinic, you can use: ? An automated blood pressure machine at a pharmacy. ? A home blood pressure monitor.  Talk to  your health care provider about your target blood pressure.  If you are between 65-40 years old, ask your health care provider if you should take aspirin to prevent heart disease.  Have regular diabetes screenings by checking your fasting blood sugar level. ? If you are at a normal weight and have a low risk for diabetes, have this test once every three years after the age of 78. ? If you are overweight and have a high risk for diabetes, consider being tested at a younger age or more often.  A one-time screening for abdominal aortic aneurysm (AAA) by ultrasound is recommended for men aged 20-75 years who are current or former smokers. What should I know about preventing infection? Hepatitis B If you have a higher risk for hepatitis B, you should be screened for this virus. Talk with your health care provider to find out if you are at risk for hepatitis B infection. Hepatitis C Blood testing is recommended for:  Everyone born from [birth year] through 1965.  Anyone with known risk factors for hepatitis C.  Sexually Transmitted Diseases (STDs)  You should be screened each year for STDs including gonorrhea and chlamydia if: ? You are sexually active and are younger than 52 years of age. ? You are older than 52 years of age and your health care provider  tells you that you are at risk for this type of infection. ? Your sexual activity has changed since you were last screened and you are at an increased risk for chlamydia or gonorrhea. Ask your health care provider if you are at risk.  Talk with your health care provider about whether you are at high risk of being infected with HIV. Your health care provider may recommend a prescription medicine to help prevent HIV infection.  What else can I do?  Schedule regular health, dental, and eye exams.  Stay current with your vaccines (immunizations).  Do not use any tobacco products, such as cigarettes, chewing tobacco, and e-cigarettes. If you need  help quitting, ask your health care provider.  Limit alcohol intake to no more than 2 drinks per day. One drink equals 12 ounces of beer, 5 ounces of wine, or 1 ounces of hard liquor.  Do not use street drugs.  Do not share needles.  Ask your health care provider for help if you need support or information about quitting drugs.  Tell your health care provider if you often feel depressed.  Tell your health care provider if you have ever been abused or do not feel safe at home. This information is not intended to replace advice given to you by your health care provider. Make sure you discuss any questions you have with your health care provider. Document Released: 04/12/2008 Document Revised: 06/13/2016 Document Reviewed: 07/19/2015 Elsevier Interactive Patient Education  2018 Elsevier Inc.      Agustina Caroli, MD [facility]

## 2018-05-23 NOTE — Patient Instructions (Addendum)
   IF you received an x-ray today, you will receive an invoice from [facility]. Please contact [facility] at [phone] with questions or concerns regarding your invoice.   IF you received labwork today, you will receive an invoice from [facility]. Please contact [facility] at [phone] with questions or concerns regarding your invoice.   Our billing staff will not be able to assist you with questions regarding bills from these companies.  You will be contacted with the lab results as soon as they are available. The fastest way to get your results is to activate your My Chart account. Instructions are located on the last page of this paperwork. If you have not heard from us regarding the results in 2 weeks, please contact this office.      Health Maintenance, Male A healthy lifestyle and preventive care is important for your health and wellness. Ask your health care provider about what schedule of regular examinations is right for you. What should I know about weight and diet? Eat a Healthy Diet  Eat plenty of vegetables, fruits, whole grains, low-fat dairy products, and lean protein.  Do not eat a lot of foods high in solid fats, added sugars, or salt.  Maintain a Healthy Weight Regular exercise can help you achieve or maintain a healthy weight. You should:  Do at least 150 minutes of exercise each week. The exercise should increase your heart rate and make you sweat (moderate-intensity exercise).  Do strength-training exercises at least twice a week.  Watch Your Levels of Cholesterol and Blood Lipids  Have your blood tested for lipids and cholesterol every 5 years starting at 52 years of age. If you are at high risk for heart disease, you should start having your blood tested when you are 52 years old. You may need to have your cholesterol levels checked more often if: ? Your lipid or cholesterol levels are high. ? You are older than 52 years of age. ? You  are at high risk for heart disease.  What should I know about cancer screening? Many types of cancers can be detected early and may often be prevented. Lung Cancer  You should be screened every year for lung cancer if: ? You are a current smoker who has smoked for at least 30 years. ? You are a former smoker who has quit within the past 15 years.  Talk to your health care provider about your screening options, when you should start screening, and how often you should be screened.  Colorectal Cancer  Routine colorectal cancer screening usually begins at 52 years of age and should be repeated every 5-10 years until you are 52 years old. You may need to be screened more often if early forms of precancerous polyps or small growths are found. Your health care provider may recommend screening at an earlier age if you have risk factors for colon cancer.  Your health care provider may recommend using home test kits to check for hidden blood in the stool.  A small camera at the end of a tube can be used to examine your colon (sigmoidoscopy or colonoscopy). This checks for the earliest forms of colorectal cancer.  Prostate and Testicular Cancer  Depending on your age and overall health, your health care provider may do certain tests to screen for prostate and testicular cancer.  Talk to your health care provider about any symptoms or concerns you have about testicular or prostate cancer.  Skin Cancer  Check your skin   from head to toe regularly.  Tell your health care provider about any new moles or changes in moles, especially if: ? There is a change in a mole's size, shape, or color. ? You have a mole that is larger than a pencil eraser.  Always use sunscreen. Apply sunscreen liberally and repeat throughout the day.  Protect yourself by wearing long sleeves, pants, a wide-brimmed hat, and sunglasses when outside.  What should I know about heart disease, diabetes, and high blood  pressure?  If you are 18-39 years of age, have your blood pressure checked every 3-5 years. If you are 40 years of age or older, have your blood pressure checked every year. You should have your blood pressure measured twice-once when you are at a hospital or clinic, and once when you are not at a hospital or clinic. Record the average of the two measurements. To check your blood pressure when you are not at a hospital or clinic, you can use: ? An automated blood pressure machine at a pharmacy. ? A home blood pressure monitor.  Talk to your health care provider about your target blood pressure.  If you are between 45-79 years old, ask your health care provider if you should take aspirin to prevent heart disease.  Have regular diabetes screenings by checking your fasting blood sugar level. ? If you are at a normal weight and have a low risk for diabetes, have this test once every three years after the age of 45. ? If you are overweight and have a high risk for diabetes, consider being tested at a younger age or more often.  A one-time screening for abdominal aortic aneurysm (AAA) by ultrasound is recommended for men aged 65-75 years who are current or former smokers. What should I know about preventing infection? Hepatitis B If you have a higher risk for hepatitis B, you should be screened for this virus. Talk with your health care provider to find out if you are at risk for hepatitis B infection. Hepatitis C Blood testing is recommended for:  Everyone born from 1945 through 1965.  Anyone with known risk factors for hepatitis C.  Sexually Transmitted Diseases (STDs)  You should be screened each year for STDs including gonorrhea and chlamydia if: ? You are sexually active and are younger than 52 years of age. ? You are older than 52 years of age and your health care provider tells you that you are at risk for this type of infection. ? Your sexual activity has changed since you were last  screened and you are at an increased risk for chlamydia or gonorrhea. Ask your health care provider if you are at risk.  Talk with your health care provider about whether you are at high risk of being infected with HIV. Your health care provider may recommend a prescription medicine to help prevent HIV infection.  What else can I do?  Schedule regular health, dental, and eye exams.  Stay current with your vaccines (immunizations).  Do not use any tobacco products, such as cigarettes, chewing tobacco, and e-cigarettes. If you need help quitting, ask your health care provider.  Limit alcohol intake to no more than 2 drinks per day. One drink equals 12 ounces of beer, 5 ounces of wine, or 1 ounces of hard liquor.  Do not use street drugs.  Do not share needles.  Ask your health care provider for help if you need support or information about quitting drugs.  Tell your health care   provider if you often feel depressed.  Tell your health care provider if you have ever been abused or do not feel safe at home. This information is not intended to replace advice given to you by your health care provider. Make sure you discuss any questions you have with your health care provider. Document Released: 04/12/2008 Document Revised: 06/13/2016 Document Reviewed: 07/19/2015 Elsevier Interactive Patient Education  2018 Elsevier Inc.  

## 2018-05-24 LAB — COMPREHENSIVE METABOLIC PANEL
A/G RATIO: 1.7 (ref 1.2–2.2)
ALK PHOS: 60 IU/L (ref 39–117)
ALT: 35 IU/L (ref 0–44)
AST: 27 IU/L (ref 0–40)
Albumin: 4.6 g/dL (ref 3.5–5.5)
BILIRUBIN TOTAL: 0.9 mg/dL (ref 0.0–1.2)
BUN/Creatinine Ratio: 25 — ABNORMAL HIGH (ref 9–20)
BUN: 23 mg/dL (ref 6–24)
CHLORIDE: 99 mmol/L (ref 96–106)
CO2: 27 mmol/L (ref 20–29)
Calcium: 9.7 mg/dL (ref 8.7–10.2)
Creatinine, Ser: 0.92 mg/dL (ref 0.76–1.27)
GFR calc Af Amer: 111 mL/min/{1.73_m2} (ref >59)
GFR calc non Af Amer: 96 mL/min/{1.73_m2} (ref >59)
Globulin, Total: 2.7 g/dL (ref 1.5–4.5)
Glucose: 103 mg/dL — ABNORMAL HIGH (ref 65–99)
POTASSIUM: 4.5 mmol/L (ref 3.5–5.2)
SODIUM: 143 mmol/L (ref 134–144)
Total Protein: 7.3 g/dL (ref 6.0–8.5)

## 2018-05-24 LAB — LIPID PANEL
CHOLESTEROL TOTAL: 150 mg/dL (ref 100–199)
Chol/HDL Ratio: 4.4 ratio (ref 0.0–5.0)
HDL: 34 mg/dL — ABNORMAL LOW (ref >39)
LDL Calculated: 79 mg/dL (ref 0–99)
Triglycerides: 183 mg/dL — ABNORMAL HIGH (ref 0–149)
VLDL CHOLESTEROL CAL: 37 mg/dL (ref 5–40)

## 2018-05-24 LAB — HEMOGLOBIN A1C
ESTIMATED AVERAGE GLUCOSE: 126 mg/dL
HEMOGLOBIN A1C: 6 % — AB (ref 4.8–5.6)

## 2018-05-24 LAB — PSA: PROSTATE SPECIFIC AG, SERUM: 1.1 ng/mL (ref 0.0–4.0)

## 2018-05-26 ENCOUNTER — Encounter: Payer: Self-pay | Admitting: *Deleted

## 2018-05-26 NOTE — Progress Notes (Signed)
Letter sent.

## 2018-06-23 ENCOUNTER — Other Ambulatory Visit: Payer: Self-pay | Admitting: Physician Assistant

## 2018-08-09 ENCOUNTER — Encounter: Payer: Self-pay | Admitting: Neurology

## 2018-08-11 ENCOUNTER — Ambulatory Visit: Payer: 59 | Admitting: Neurology

## 2018-08-11 ENCOUNTER — Encounter: Payer: Self-pay | Admitting: Neurology

## 2018-08-11 VITALS — BP 148/87 | HR 75 | Ht 67.0 in | Wt 307.0 lb

## 2018-08-11 DIAGNOSIS — E6609 Other obesity due to excess calories: Secondary | ICD-10-CM

## 2018-08-11 DIAGNOSIS — Z87898 Personal history of other specified conditions: Secondary | ICD-10-CM

## 2018-08-11 DIAGNOSIS — Z8639 Personal history of other endocrine, nutritional and metabolic disease: Secondary | ICD-10-CM | POA: Diagnosis not present

## 2018-08-11 DIAGNOSIS — Z9989 Dependence on other enabling machines and devices: Secondary | ICD-10-CM

## 2018-08-11 DIAGNOSIS — Z6838 Body mass index (BMI) 38.0-38.9, adult: Secondary | ICD-10-CM

## 2018-08-11 DIAGNOSIS — G4733 Obstructive sleep apnea (adult) (pediatric): Secondary | ICD-10-CM

## 2018-08-11 NOTE — Progress Notes (Signed)
[facility]   Provider:  Larey Seat, M D  Primary Care Physician:   Referring Provider: No ref. provider found    Chief Complaint  Patient presents with  . Follow-up    pt alone rm 10. pt states machine is working well. DME [employer].     HPI:   Andrew [name]. is a 52 y.o. male , was seen here as in a Rv. 08-11-2018, he asks for a prescription to get supplies through the internet.  This is a revisit for Andrew [name]. Andrew [name]., [name] by 52 year old Caucasian right-handed male with a long-standing history of CPAP use in the treatment of obstructive sleep apnea.  He was originally referred to me in 2015 after having followed Andrew [name]. He has remained 100% compliant CPAP user with an average time of use of 7 hours 32 minutes at night, his AutoSet is set between 5 and 20 cmH2O with 3 cm EPR, his 95th percentile pressure is 10.9 cmH2O, his residual AHI is only 0.7/h.  There are no central apneas arising and there are no major air leaks.  He needs new supplies.  He has had some bad experience with his current durable medical equipment company which apparently communicated rather poorly with him.    HPI:  03-02-2014 -Andrew [name] has followed this patient for many years. He had an episode of chest pain and was evaluated by cardiology. Given his family history of  cardiopulmonary disease , he was also asked to undergo a sleep study. However, due to financial reasons he was not able to do it at the time it was originally ordered. He was concerned about the costs. Dr Everlene [name] was testing him for fatigue and sleepiness, and found to have hypo-testosteroemia. He could not tolerate the testosterone supplements due to severe itching.    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 He aims for 10 PM and rises  most morning at 6.30 , spontaneously , with a back up alarm. He is on call frequently, on average 7-8 hours of sleep.  No nocturia, no morning headaches, but a dry mouth  Loud snoring reported when on his back.  His bedroom is cool, quiet, dark.He shares the bed room with his wife.  I had the pleasure of seeing Andrew [name] over 3 years ago for the same symptoms and reasons that I see him today. At the time he ordered a sleep study but his insurance was not willing to pay for it. He has tried to postpone further evaluation but now feels that he has to get it done. He was seen on 08/17/2016 last by his primary care provider who refilled his antihypertensive medications. He encourage the patient to think about bariatric surgery and the patient has already had an appointment set up. The patient continues to work in a physical job and joined the [employer] for [employer] and cardio training with a final goal of weight loss.  He continues to present with a past medical history of allergies with respiratory impact, gastro-esophageal reflux disease, hyperlipidemia, hypertension on medication and obesity. He is not diabetic father and mother had heart disease and his mother had COPD and post parents also suffered from hyperlipidemia.His sister and mother had OSA and were on CPAP.   2nd October  2018. I had the pleasure of following up with Andrew [name] on his recent sleep studies. He was diagnosed with obstructive sleep apnea by home sleep test on 04/10/2017, medical history was reviewed, Epworth sleepiness score was endorsed at 14 points. The patient's AHI was 35.1 SPO2 nadir was 74%, total desaturation time at or below 88% was 52 minutes. No tachybradycardia arrhythmia noted. I recommended a CPAP titration was baseline and capnography which was declined by his insurance. We therefore used an auto titrate CPAP.  A compliance download from outdoor titration CPAP was obtained, the AutoSet was between 5 and 20  cm water with 3 cm EPR, total user time on average 7 hours and 6 minutes, 100% compliance for days, 97% compliance bedtime. There was only one day with the patient still short of a four-hour mark. Residual AHI is 0.3. The patient's 95th percentile pressure was 12.6/ hr.  he does have some higher air leaks using a nasal pillow. He uses now a nasal mask , large mask. He has now plans for bariatric surgery- and the auto-titrator will come in handy. He feels better , more energized, sleeps more soundly and his wife is happy that snoring stopped. No more nocturia.   [name] has a family history of sudden cardiac death affecting his father at age 24. He has followed up with a cardiologist yearly.   for the first time the patient endorses a very high degree of sleepiness, he did not feel the same way 3 years ago when I last saw him.   I think that a sleep study is urgently needed also in preparation of bariatric surgery. He will have an appointment with his bariatric surgeon June 1. It would be lovely if he could do a split-night polysomnography before.   he is restless and his wife reports PLMs.  This cannot be appreciated in a HST>   Soc: ETOH 12 drinks a month, no tobacco use, caffeine - a lot, Coffee 4-6 a day. 3 diet cokes a week.  No pets , daughter is 49 and lives at home.    Review of Systems: Out of a complete 14 system review, the patient complains of only the following symptoms, and all other reviewed systems are negative.   The patient grew up with passive smoke exposure but has never smoked. He endorsed the Epworth sleepiness score today at 14 points out of 24 and he has fallen asleep at the wheel which urged this reevaluation now. His fatigue degree is 36. He is not depressed. He snores, he has apnea.   Social History   Socioeconomic History  . Marital status: Married    Spouse name: Andrew [name]  . Number of children: 1  . Years of education: College  . Highest education level:  Not on file  Occupational History  . Occupation: Therapist, occupational: [employer]  Social Needs  . Financial resource strain: Not on file  . Food insecurity:    Worry: Not on file    Inability: Not on file  . Transportation needs:    Medical: Not on file    Non-medical: Not on file  Tobacco Use  . Smoking status: Never Smoker  . Smokeless tobacco: Never Used  Substance and Sexual Activity  . Alcohol use: Yes    Alcohol/week: 2.0 - 3.0 standard drinks    Types: 2 - 3 Standard drinks or equivalent per week    Comment: occasionaly beer (12 pk per month)  .  Drug use: No  . Sexual activity: Never  Lifestyle  . Physical activity:    Days per week: Not on file    Minutes per session: Not on file  . Stress: Not on file  Relationships  . Social connections:    Talks on phone: Not on file    Gets together: Not on file    Attends religious service: Not on file    Active member of club or organization: Not on file    Attends meetings of clubs or organizations: Not on file    Relationship status: Not on file  . Intimate partner violence:    Fear of current or ex partner: Not on file    Emotionally abused: Not on file    Physically abused: Not on file    Forced sexual activity: Not on file  Other Topics Concern  . Not on file  Social History Narrative   Patient is married Andrew [name]) and lives at home with his wife and one child.   Patient is working full-time.   Patient has a college education.   Patient is right-handed.   Patient drinks two cups of coffee daily.   Exercise 2 times a week  On treadmill    Family History  Problem Relation Age of Onset  . Heart attack Mother   . COPD Mother   . Heart disease Mother   . Hyperlipidemia Mother   . Heart attack Father   . Heart disease Father   . Hyperlipidemia Father   . Hypertension Other     Past Medical History:  Diagnosis Date  . Epididymal cyst    left  . Family history of premature CAD   . History of  exercise intolerance    06-03-2015--  dr Angelena Form--  no ischemia or chest pain and no arrhythmias  . Hyperlipidemia   . Hypertension    cardiologist-  dr Angelena Form  . Left posterior fascicular block   . Obesity, Class III, BMI 40-49.9 (morbid obesity) ([place])   . Right hydrocele   . Wears glasses     Past Surgical History:  Procedure Laterality Date  . CARDIOVASCULAR STRESS TEST  04/23/2011   normal nuclear study w/ no ischemia (no change from previous exam)/  normal LV function and wall motion , ef 64%  . COLONOSCOPY  2012  . HERNIA REPAIR  infant  . HYDROCELE EXCISION Right 09/17/2016   Procedure: HYDROCELECTOMY ADULT;  Surgeon: Kathie Rhodes, MD;  Location: [facility];  Service: Urology;  Laterality: Right;  . WISDOM TOOTH EXTRACTION  2002    Current Outpatient Medications  Medication Sig Dispense Refill  . aspirin 81 MG tablet Take 81 mg by mouth daily.      Marland Kitchen lisinopril-hydrochlorothiazide (PRINZIDE,ZESTORETIC) 10-12.5 MG tablet TAKE 1 TABLET BY MOUTH DAILY. 90 tablet 1   No current facility-administered medications for this visit.     Allergies as of 08/11/2018 - Review Complete 08/11/2018  Allergen Reaction Noted  . Testosterone Rash 02/03/2013    Vitals: BP (!) 148/87   Pulse 75   Ht 5\' 7"  (1.702 m)   Wt (!) 307 lb (139.3 kg)   BMI 48.08 kg/m  Last Weight:  Wt Readings from Last 1 Encounters:  08/11/18 (!) 307 lb (139.3 kg)   Last Height:   Ht Readings from Last 1 Encounters:  08/11/18 5\' 7"  (1.702 m)    Physical exam:  General: The patient is awake, alert and appears not in acute distress. The patient  is well groomed. Head: Normocephalic, atraumatic. Neck is supple. Mallampati 5, neck circumference:21.5  inches , no jaw lock. No retrognathia -but crowded airway.  Cardiovascular:  Regular rate and rhythm, without murmurs or carotid bruit, and without distended neck veins. Respiratory: Lungs are clear to auscultation.Skin:  Without evidence  of edema, or rash. Trunk: BMI is severely elevated ( super obese at 47 ) His posture is erect.  Neurologic exam :The patient is awake and alert, but struggles to stay awake- oriented to place and time.   There is a normal attention span & concentration ability. Speech is fluent without  dysarthria, dysphonia or aphasia. Mood and affect are appropriate.  Cranial nerves: taste and smell sense intact -Pupils are equal and briskly reactive to light.  Facial sensation intact to fine touch. Facial motor strength is symmetric and tongue moves midline. I cannot appreciate the uvula.  Motor exam:  Normal tone and normal muscle bulk , symmetric strength in all four extremities. Strong grip strength. Sensory:  Fine touch, pinprick and vibration were tested in all extremities.  Gait and station: Patient walks without assistive device . Deep tendon reflexes: in the upper and lower extremities are symmetric and intact.   Assessment:  After physical and neurologic examination, review of laboratory studies, imaging, neurophysiology testing and pre-existing records, assessment is  1) Patient with moderatly- severe  OSA, by BMI and neck size, and by history of snoring and witnessed apnea. 2) Doing well on Autotitration- but very unhappy with [employer], sending bills to the wrong address and not calling him ( [employer] had the Telephone )      Plan:  Treatment plan and additional workup :   1) Continue with Auto-titration- 5-20 cm water, nasal mask of patient's choice.   Patient wants to change to another DME-     Larey Seat, MD

## 2018-08-11 NOTE — Patient Instructions (Signed)

## 2018-11-28 ENCOUNTER — Ambulatory Visit: Payer: BLUE CROSS/BLUE SHIELD | Admitting: Emergency Medicine

## 2018-11-28 ENCOUNTER — Encounter: Payer: Self-pay | Admitting: Emergency Medicine

## 2018-11-28 ENCOUNTER — Other Ambulatory Visit: Payer: Self-pay

## 2018-11-28 VITALS — BP 123/80 | HR 77 | Temp 98.3°F | Resp 16 | Ht 67.0 in | Wt 298.6 lb

## 2018-11-28 DIAGNOSIS — R7303 Prediabetes: Secondary | ICD-10-CM | POA: Diagnosis not present

## 2018-11-28 DIAGNOSIS — I10 Essential (primary) hypertension: Secondary | ICD-10-CM

## 2018-11-28 NOTE — Patient Instructions (Addendum)
   If you have lab work done today you will be contacted with your lab results within the next 2 weeks.  If you have not heard from us then please contact us. The fastest way to get your results is to register for My Chart.   IF you received an x-ray today, you will receive an invoice from [facility]. Please contact [facility] at [phone] with questions or concerns regarding your invoice.   IF you received labwork today, you will receive an invoice from [facility]. Please contact [facility] at [phone] with questions or concerns regarding your invoice.   Our billing staff will not be able to assist you with questions regarding bills from these companies.  You will be contacted with the lab results as soon as they are available. The fastest way to get your results is to activate your My Chart account. Instructions are located on the last page of this paperwork. If you have not heard from us regarding the results in 2 weeks, please contact this office.       Hypertension Hypertension, commonly called high blood pressure, is when the force of blood pumping through the arteries is too strong. The arteries are the blood vessels that carry blood from the heart throughout the body. Hypertension forces the heart to work harder to pump blood and may cause arteries to become narrow or stiff. Having untreated or uncontrolled hypertension can cause heart attacks, strokes, kidney disease, and other problems. A blood pressure reading consists of a higher number over a lower number. Ideally, your blood pressure should be below 120/80. The first ("top") number is called the systolic pressure. It is a measure of the pressure in your arteries as your heart beats. The second ("bottom") number is called the diastolic pressure. It is a measure of the pressure in your arteries as the heart relaxes. What are the causes? The cause of this condition is not known. What increases the  risk? Some risk factors for high blood pressure are under your control. Others are not. Factors you can change  Smoking.  Having type 2 diabetes mellitus, high cholesterol, or both.  Not getting enough exercise or physical activity.  Being overweight.  Having too much fat, sugar, calories, or salt (sodium) in your diet.  Drinking too much alcohol. Factors that are difficult or impossible to change  Having chronic kidney disease.  Having a family history of high blood pressure.  Age. Risk increases with age.  Race. You may be at higher risk if you are African-American.  Gender. Men are at higher risk than women before age 45. After age 65, women are at higher risk than men.  Having obstructive sleep apnea.  Stress. What are the signs or symptoms? Extremely high blood pressure (hypertensive crisis) may cause:  Headache.  Anxiety.  Shortness of breath.  Nosebleed.  Nausea and vomiting.  Severe chest pain.  Jerky movements you cannot control (seizures). How is this diagnosed? This condition is diagnosed by measuring your blood pressure while you are seated, with your arm resting on a surface. The cuff of the blood pressure monitor will be placed directly against the skin of your upper arm at the level of your heart. It should be measured at least twice using the same arm. Certain conditions can cause a difference in blood pressure between your right and left arms. Certain factors can cause blood pressure readings to be lower or higher than normal (elevated) for a short period of time:    When your blood pressure is higher when you are in a health care provider's office than when you are at home, this is called white coat hypertension. Most people with this condition do not need medicines.  When your blood pressure is higher at home than when you are in a health care provider's office, this is called masked hypertension. Most people with this condition may need medicines  to control blood pressure. If you have a high blood pressure reading during one visit or you have normal blood pressure with other risk factors:  You may be asked to return on a different day to have your blood pressure checked again.  You may be asked to monitor your blood pressure at home for 1 week or longer. If you are diagnosed with hypertension, you may have other blood or imaging tests to help your health care provider understand your overall risk for other conditions. How is this treated? This condition is treated by making healthy lifestyle changes, such as eating healthy foods, exercising more, and reducing your alcohol intake. Your health care provider may prescribe medicine if lifestyle changes are not enough to get your blood pressure under control, and if:  Your systolic blood pressure is above 130.  Your diastolic blood pressure is above 80. Your personal target blood pressure may vary depending on your medical conditions, your age, and other factors. Follow these instructions at home: Eating and drinking   Eat a diet that is high in fiber and potassium, and low in sodium, added sugar, and fat. An example eating plan is called the DASH (Dietary Approaches to Stop Hypertension) diet. To eat this way: ? Eat plenty of fresh fruits and vegetables. Try to fill half of your plate at each meal with fruits and vegetables. ? Eat whole grains, such as whole wheat pasta, brown rice, or whole grain bread. Fill about one quarter of your plate with whole grains. ? Eat or drink low-fat dairy products, such as skim milk or low-fat yogurt. ? Avoid fatty cuts of meat, processed or cured meats, and poultry with skin. Fill about one quarter of your plate with lean proteins, such as fish, chicken without skin, beans, eggs, and tofu. ? Avoid premade and processed foods. These tend to be higher in sodium, added sugar, and fat.  Reduce your daily sodium intake. Most people with hypertension should  eat less than 1,500 mg of sodium a day.  Limit alcohol intake to no more than 1 drink a day for nonpregnant women and 2 drinks a day for men. One drink equals 12 oz of beer, 5 oz of wine, or 1 oz of hard liquor. Lifestyle   Work with your health care provider to maintain a healthy body weight or to lose weight. Ask what an ideal weight is for you.  Get at least 30 minutes of exercise that causes your heart to beat faster (aerobic exercise) most days of the week. Activities may include walking, swimming, or biking.  Include exercise to strengthen your muscles (resistance exercise), such as pilates or lifting weights, as part of your weekly exercise routine. Try to do these types of exercises for 30 minutes at least 3 days a week.  Do not use any products that contain nicotine or tobacco, such as cigarettes and e-cigarettes. If you need help quitting, ask your health care provider.  Monitor your blood pressure at home as told by your health care provider.  Keep all follow-up visits as told by your health care provider.   This is important. Medicines  Take over-the-counter and prescription medicines only as told by your health care provider. Follow directions carefully. Blood pressure medicines must be taken as prescribed.  Do not skip doses of blood pressure medicine. Doing this puts you at risk for problems and can make the medicine less effective.  Ask your health care provider about side effects or reactions to medicines that you should watch for. Contact a health care provider if:  You think you are having a reaction to a medicine you are taking.  You have headaches that keep coming back (recurring).  You feel dizzy.  You have swelling in your ankles.  You have trouble with your vision. Get help right away if:  You develop a severe headache or confusion.  You have unusual weakness or numbness.  You feel faint.  You have severe pain in your chest or abdomen.  You vomit  repeatedly.  You have trouble breathing. Summary  Hypertension is when the force of blood pumping through your arteries is too strong. If this condition is not controlled, it may put you at risk for serious complications.  Your personal target blood pressure may vary depending on your medical conditions, your age, and other factors. For most people, a normal blood pressure is less than 120/80.  Hypertension is treated with lifestyle changes, medicines, or a combination of both. Lifestyle changes include weight loss, eating a healthy, low-sodium diet, exercising more, and limiting alcohol. This information is not intended to replace advice given to you by your health care provider. Make sure you discuss any questions you have with your health care provider. Document Released: 10/15/2005 Document Revised: 09/12/2016 Document Reviewed: 09/12/2016 Elsevier Interactive Patient Education  2019 [employer]  

## 2018-11-28 NOTE — Progress Notes (Addendum)
BP Readings from Last 3 Encounters:  08/11/18 (!) 148/87  05/23/18 106/76  07/30/17 138/85   Andrew [name]. 53 y.o.   Chief Complaint  Patient presents with  . Hypertension    6 months follow up after the CPE    HISTORY OF PRESENT ILLNESS: This is a 53 y.o. male with history of hypertension and prediabetes, here for follow-up.  Has no complaints or medical concerns today. Blood work done July 2019 reviewed with patient.  No concerns.  Mild elevation of triglycerides and hemoglobin A1c at 6.0.  Otherwise unremarkable labs.  HPI   Prior to Admission medications   Medication Sig Start Date End Date Taking? Authorizing Provider  aspirin 81 MG tablet Take 81 mg by mouth daily.     Yes [provider]  lisinopril-hydrochlorothiazide (PRINZIDE,ZESTORETIC) 10-12.5 MG tablet TAKE 1 TABLET BY MOUTH DAILY. 06/23/18  Yes Horald Pollen, MD    Allergies  Allergen Reactions  . Testosterone Rash    SEVERE RASH WITH ITCHING (TOPICAL AND INJECTION)    Patient Active Problem List   Diagnosis Date Noted  . OSA on CPAP 05/17/2017  . Morbid obesity ([place]) 05/17/2017  . Hydrocele, right 11/17/2015  . Hypertension 05/13/2012  . Hypogonadism male 05/13/2012    Past Medical History:  Diagnosis Date  . Epididymal cyst    left  . Family history of premature CAD   . History of exercise intolerance    06-03-2015--  dr Angelena Form--  no ischemia or chest pain and no arrhythmias  . Hyperlipidemia   . Hypertension    cardiologist-  dr Angelena Form  . Left posterior fascicular block   . Obesity, Class III, BMI 40-49.9 (morbid obesity) ([place])   . Right hydrocele   . Wears glasses     Past Surgical History:  Procedure Laterality Date  . CARDIOVASCULAR STRESS TEST  04/23/2011   normal nuclear study w/ no ischemia (no change from previous exam)/  normal LV function and wall motion , ef 64%  . COLONOSCOPY  2012  . HERNIA REPAIR  infant  . HYDROCELE EXCISION Right 09/17/2016     Procedure: HYDROCELECTOMY ADULT;  Surgeon: Kathie Rhodes, MD;  Location: [facility];  Service: Urology;  Laterality: Right;  . WISDOM TOOTH EXTRACTION  2002    Social History   Socioeconomic History  . Marital status: Married    Spouse name: Andrew [name]  . Number of children: 1  . Years of education: College  . Highest education level: Not on file  Occupational History  . Occupation: Therapist, occupational: [employer]  Social Needs  . Financial resource strain: Not on file  . Food insecurity:    Worry: Not on file    Inability: Not on file  . Transportation needs:    Medical: Not on file    Non-medical: Not on file  Tobacco Use  . Smoking status: Never Smoker  . Smokeless tobacco: Never Used  Substance and Sexual Activity  . Alcohol use: Yes    Alcohol/week: 2.0 - 3.0 standard drinks    Types: 2 - 3 Standard drinks or equivalent per week    Comment: occasionaly beer (12 pk per month)  . Drug use: No  . Sexual activity: Never  Lifestyle  . Physical activity:    Days per week: Not on file    Minutes per session: Not on file  . Stress: Not on file  Relationships  . Social connections:  Talks on phone: Not on file    Gets together: Not on file    Attends religious service: Not on file    Active member of club or organization: Not on file    Attends meetings of clubs or organizations: Not on file    Relationship status: Not on file  . Intimate partner violence:    Fear of current or ex partner: Not on file    Emotionally abused: Not on file    Physically abused: Not on file    Forced sexual activity: Not on file  Other Topics Concern  . Not on file  Social History Narrative   Patient is married Andrew [name]) and lives at home with his wife and one child.   Patient is working full-time.   Patient has a college education.   Patient is right-handed.   Patient drinks two cups of coffee daily.   Exercise 2 times a week  On treadmill    Family  History  Problem Relation Age of Onset  . Heart attack Mother   . COPD Mother   . Heart disease Mother   . Hyperlipidemia Mother   . Heart attack Father   . Heart disease Father   . Hyperlipidemia Father   . Hypertension Other      Review of Systems  Constitutional: Negative.  Negative for chills and fever.  HENT: Negative.  Negative for congestion and nosebleeds.   Eyes: Negative.  Negative for blurred vision and double vision.  Respiratory: Negative.  Negative for cough and shortness of breath.   Cardiovascular: Negative.  Negative for chest pain, palpitations and leg swelling.  Gastrointestinal: Negative.  Negative for abdominal pain, diarrhea, nausea and vomiting.  Genitourinary: Negative.  Negative for dysuria and hematuria.  Musculoskeletal: Negative.  Negative for back pain, myalgias and neck pain.  Skin: Negative.  Negative for rash.  Neurological: Negative.  Negative for dizziness and headaches.  Endo/Heme/Allergies: Negative.   All other systems reviewed and are negative.   Vitals:   11/28/18 0805  BP: 123/80  Pulse: 77  Resp: 16  Temp: 98.3 F (36.8 C)  SpO2: 96%    Physical Exam Vitals signs reviewed.  Constitutional:      Appearance: He is obese.  HENT:     Head: Normocephalic and atraumatic.     Mouth/Throat:     Mouth: Mucous membranes are moist.     Pharynx: Oropharynx is clear.  Eyes:     Extraocular Movements: Extraocular movements intact.     Conjunctiva/sclera: Conjunctivae normal.     Pupils: Pupils are equal, round, and reactive to light.  Neck:     Musculoskeletal: Normal range of motion and neck supple.  Cardiovascular:     Rate and Rhythm: Normal rate and regular rhythm.     Heart sounds: Normal heart sounds.  Pulmonary:     Effort: Pulmonary effort is normal.     Breath sounds: Normal breath sounds.  Abdominal:     General: There is no distension.     Palpations: Abdomen is soft.     Tenderness: There is no abdominal tenderness.    Musculoskeletal: Normal range of motion.  Skin:    General: Skin is warm and dry.     Capillary Refill: Capillary refill takes less than 2 seconds.  Neurological:     General: No focal deficit present.     Mental Status: He is alert and oriented to person, place, and time.  Psychiatric:  Mood and Affect: Mood normal.        Behavior: Behavior normal.   The 10-year ASCVD risk score [name] DC Jr., et al., 2013) is: 4.6%   Values used to calculate the score:     Age: 41 years     Sex: Male     Is Non-Hispanic African American: No     Diabetic: No     Tobacco smoker: No     Systolic Blood Pressure: 427 mmHg     Is BP treated: Yes     HDL Cholesterol: 34 mg/dL     Total Cholesterol: 150 mg/dL  A total of 25 minutes was spent in the room with the patient, greater than 50% of which was in counseling/coordination of care regarding hypertension, management and treatment, medications and side effects, nutrition, and need for follow-up.  The 10-year ASCVD risk score [name] DC Brooke Bonito., et al., 2013) is: 5.8%   Values used to calculate the score:     Age: 68 years     Sex: Male     Is Non-Hispanic African American: No     Diabetic: No     Tobacco smoker: No     Systolic Blood Pressure: 062 mmHg     Is BP treated: Yes     HDL Cholesterol: 31 mg/dL     Total Cholesterol: 167 mg/dL  ASSESSMENT & PLAN: Andrew [name] was seen today for hypertension.  Diagnoses and all orders for this visit:  Essential hypertension -     Comprehensive metabolic panel -     CBC with Differential/Platelet  Prediabetes -     Lipid panel -     CBC with Differential/Platelet -     Hemoglobin A1c    Patient Instructions       If you have lab work done today you will be contacted with your lab results within the next 2 weeks.  If you have not heard from [place] then please contact us. The fastest way to get your results is to register for My Chart.   IF you received an x-ray today, you will receive an  invoice from [facility]. Please contact [facility] at [phone] with questions or concerns regarding your invoice.   IF you received labwork today, you will receive an invoice from [place]. Please contact [facility] at [phone] with questions or concerns regarding your invoice.   Our billing staff will not be able to assist you with questions regarding bills from these companies.  You will be contacted with the lab results as soon as they are available. The fastest way to get your results is to activate your My Chart account. Instructions are located on the last page of this paperwork. If you have not heard from [place] regarding the results in 2 weeks, please contact this office.     Hypertension Hypertension, commonly called high blood pressure, is when the force of blood pumping through the arteries is too strong. The arteries are the blood vessels that carry blood from the heart throughout the body. Hypertension forces the heart to work harder to pump blood and may cause arteries to become narrow or stiff. Having untreated or uncontrolled hypertension can cause heart attacks, strokes, kidney disease, and other problems. A blood pressure reading consists of a higher number over a lower number. Ideally, your blood pressure should be below 120/80. The first ("top") number is called the systolic pressure. It is a measure of the pressure in your arteries as your heart beats. The  second ("bottom") number is called the diastolic pressure. It is a measure of the pressure in your arteries as the heart relaxes. What are the causes? The cause of this condition is not known. What increases the risk? Some risk factors for high blood pressure are under your control. Others are not. Factors you can change  Smoking.  Having type 2 diabetes mellitus, high cholesterol, or both.  Not getting enough exercise or physical activity.  Being overweight.  Having too much fat, sugar,  calories, or salt (sodium) in your diet.  Drinking too much alcohol. Factors that are difficult or impossible to change  Having chronic kidney disease.  Having a family history of high blood pressure.  Age. Risk increases with age.  Race. You may be at higher risk if you are African-American.  Gender. Men are at higher risk than women before age 26. After age 12, women are at higher risk than men.  Having obstructive sleep apnea.  Stress. What are the signs or symptoms? Extremely high blood pressure (hypertensive crisis) may cause:  Headache.  Anxiety.  Shortness of breath.  Nosebleed.  Nausea and vomiting.  Severe chest pain.  Jerky movements you cannot control (seizures). How is this diagnosed? This condition is diagnosed by measuring your blood pressure while you are seated, with your arm resting on a surface. The cuff of the blood pressure monitor will be placed directly against the skin of your upper arm at the level of your heart. It should be measured at least twice using the same arm. Certain conditions can cause a difference in blood pressure between your right and left arms. Certain factors can cause blood pressure readings to be lower or higher than normal (elevated) for a short period of time:  When your blood pressure is higher when you are in a health care provider's office than when you are at home, this is called white coat hypertension. Most people with this condition do not need medicines.  When your blood pressure is higher at home than when you are in a health care provider's office, this is called masked hypertension. Most people with this condition may need medicines to control blood pressure. If you have a high blood pressure reading during one visit or you have normal blood pressure with other risk factors:  You may be asked to return on a different day to have your blood pressure checked again.  You may be asked to monitor your blood pressure at  home for 1 week or longer. If you are diagnosed with hypertension, you may have other blood or imaging tests to help your health care provider understand your overall risk for other conditions. How is this treated? This condition is treated by making healthy lifestyle changes, such as eating healthy foods, exercising more, and reducing your alcohol intake. Your health care provider may prescribe medicine if lifestyle changes are not enough to get your blood pressure under control, and if:  Your systolic blood pressure is above 130.  Your diastolic blood pressure is above 80. Your personal target blood pressure may vary depending on your medical conditions, your age, and other factors. Follow these instructions at home: Eating and drinking   Eat a diet that is high in fiber and potassium, and low in sodium, added sugar, and fat. An example eating plan is called the DASH (Dietary Approaches to Stop Hypertension) diet. To eat this way: ? Eat plenty of fresh fruits and vegetables. Try to fill half of your plate at  each meal with fruits and vegetables. ? Eat whole grains, such as whole wheat pasta, brown rice, or whole grain bread. Fill about one quarter of your plate with whole grains. ? Eat or drink low-fat dairy products, such as skim milk or low-fat yogurt. ? Avoid fatty cuts of meat, processed or cured meats, and poultry with skin. Fill about one quarter of your plate with lean proteins, such as fish, chicken without skin, beans, eggs, and tofu. ? Avoid premade and processed foods. These tend to be higher in sodium, added sugar, and fat.  Reduce your daily sodium intake. Most people with hypertension should eat less than 1,500 mg of sodium a day.  Limit alcohol intake to no more than 1 drink a day for nonpregnant women and 2 drinks a day for men. One drink equals 12 oz of beer, 5 oz of wine, or 1 oz of hard liquor. Lifestyle   Work with your health care provider to maintain a healthy body  weight or to lose weight. Ask what an ideal weight is for you.  Get at least 30 minutes of exercise that causes your heart to beat faster (aerobic exercise) most days of the week. Activities may include walking, swimming, or biking.  Include exercise to strengthen your muscles (resistance exercise), such as pilates or lifting weights, as part of your weekly exercise routine. Try to do these types of exercises for 30 minutes at least 3 days a week.  Do not use any products that contain nicotine or tobacco, such as cigarettes and e-cigarettes. If you need help quitting, ask your health care provider.  Monitor your blood pressure at home as told by your health care provider.  Keep all follow-up visits as told by your health care provider. This is important. Medicines  Take over-the-counter and prescription medicines only as told by your health care provider. Follow directions carefully. Blood pressure medicines must be taken as prescribed.  Do not skip doses of blood pressure medicine. Doing this puts you at risk for problems and can make the medicine less effective.  Ask your health care provider about side effects or reactions to medicines that you should watch for. Contact a health care provider if:  You think you are having a reaction to a medicine you are taking.  You have headaches that keep coming back (recurring).  You feel dizzy.  You have swelling in your ankles.  You have trouble with your vision. Get help right away if:  You develop a severe headache or confusion.  You have unusual weakness or numbness.  You feel faint.  You have severe pain in your chest or abdomen.  You vomit repeatedly.  You have trouble breathing. Summary  Hypertension is when the force of blood pumping through your arteries is too strong. If this condition is not controlled, it may put you at risk for serious complications.  Your personal target blood pressure may vary depending on your  medical conditions, your age, and other factors. For most people, a normal blood pressure is less than 120/80.  Hypertension is treated with lifestyle changes, medicines, or a combination of both. Lifestyle changes include weight loss, eating a healthy, low-sodium diet, exercising more, and limiting alcohol. This information is not intended to replace advice given to you by your health care provider. Make sure you discuss any questions you have with your health care provider. Document Released: 10/15/2005 Document Revised: 09/12/2016 Document Reviewed: 09/12/2016 Elsevier Interactive Patient Education  2019 [employer].  Agustina Caroli, MD [facility]

## 2018-11-29 ENCOUNTER — Other Ambulatory Visit: Payer: Self-pay | Admitting: Emergency Medicine

## 2018-11-29 LAB — CBC WITH DIFFERENTIAL/PLATELET
Basophils Absolute: 0 10*3/uL (ref 0.0–0.2)
Basos: 1 %
EOS (ABSOLUTE): 0.2 10*3/uL (ref 0.0–0.4)
Eos: 4 %
HEMOGLOBIN: 16 g/dL (ref 13.0–17.7)
Hematocrit: 45.4 % (ref 37.5–51.0)
Immature Grans (Abs): 0 10*3/uL (ref 0.0–0.1)
Immature Granulocytes: 0 %
LYMPHS: 21 %
Lymphocytes Absolute: 1.2 10*3/uL (ref 0.7–3.1)
MCH: 32.7 pg (ref 26.6–33.0)
MCHC: 35.2 g/dL (ref 31.5–35.7)
MCV: 93 fL (ref 79–97)
Monocytes Absolute: 0.4 10*3/uL (ref 0.1–0.9)
Monocytes: 6 %
NEUTROS PCT: 68 %
Neutrophils Absolute: 3.8 10*3/uL (ref 1.4–7.0)
Platelets: 194 10*3/uL (ref 150–450)
RBC: 4.89 x10E6/uL (ref 4.14–5.80)
RDW: 12.9 % (ref 11.6–15.4)
WBC: 5.6 10*3/uL (ref 3.4–10.8)

## 2018-11-29 LAB — COMPREHENSIVE METABOLIC PANEL
A/G RATIO: 1.8 (ref 1.2–2.2)
ALBUMIN: 4.5 g/dL (ref 3.8–4.9)
ALK PHOS: 59 IU/L (ref 39–117)
ALT: 46 IU/L — AB (ref 0–44)
AST: 30 IU/L (ref 0–40)
BILIRUBIN TOTAL: 0.8 mg/dL (ref 0.0–1.2)
BUN / CREAT RATIO: 23 — AB (ref 9–20)
BUN: 22 mg/dL (ref 6–24)
CHLORIDE: 98 mmol/L (ref 96–106)
CO2: 23 mmol/L (ref 20–29)
Calcium: 9.5 mg/dL (ref 8.7–10.2)
Creatinine, Ser: 0.96 mg/dL (ref 0.76–1.27)
GFR calc Af Amer: 105 mL/min/{1.73_m2} (ref >59)
GFR calc non Af Amer: 91 mL/min/{1.73_m2} (ref >59)
GLUCOSE: 118 mg/dL — AB (ref 65–99)
Globulin, Total: 2.5 g/dL (ref 1.5–4.5)
POTASSIUM: 4.4 mmol/L (ref 3.5–5.2)
Sodium: 140 mmol/L (ref 134–144)
Total Protein: 7 g/dL (ref 6.0–8.5)

## 2018-11-29 LAB — HEMOGLOBIN A1C
ESTIMATED AVERAGE GLUCOSE: 128 mg/dL
Hgb A1c MFr Bld: 6.1 % — ABNORMAL HIGH (ref 4.8–5.6)

## 2018-11-29 LAB — LIPID PANEL
CHOLESTEROL TOTAL: 167 mg/dL (ref 100–199)
Chol/HDL Ratio: 5.4 ratio — ABNORMAL HIGH (ref 0.0–5.0)
HDL: 31 mg/dL — ABNORMAL LOW (ref >39)
LDL Calculated: 87 mg/dL (ref 0–99)
Triglycerides: 244 mg/dL — ABNORMAL HIGH (ref 0–149)
VLDL Cholesterol Cal: 49 mg/dL — ABNORMAL HIGH (ref 5–40)

## 2018-11-29 MED ORDER — ROSUVASTATIN CALCIUM 10 MG PO TABS: 10.0000 mg | ORAL_TABLET | Freq: Every day | ORAL | 3 refills | Status: DC

## 2019-05-27 ENCOUNTER — Ambulatory Visit (INDEPENDENT_AMBULATORY_CARE_PROVIDER_SITE_OTHER): Payer: BLUE CROSS/BLUE SHIELD | Admitting: Emergency Medicine

## 2019-05-27 ENCOUNTER — Other Ambulatory Visit: Payer: Self-pay

## 2019-05-27 ENCOUNTER — Encounter: Payer: Self-pay | Admitting: Emergency Medicine

## 2019-05-27 ENCOUNTER — Ambulatory Visit (INDEPENDENT_AMBULATORY_CARE_PROVIDER_SITE_OTHER): Payer: BLUE CROSS/BLUE SHIELD

## 2019-05-27 VITALS — BP 135/78 | HR 78 | Temp 97.8°F | Resp 16 | Ht 67.0 in | Wt 308.7 lb

## 2019-05-27 DIAGNOSIS — Z0001 Encounter for general adult medical examination with abnormal findings: Secondary | ICD-10-CM

## 2019-05-27 DIAGNOSIS — Z1329 Encounter for screening for other suspected endocrine disorder: Secondary | ICD-10-CM

## 2019-05-27 DIAGNOSIS — E781 Pure hyperglyceridemia: Secondary | ICD-10-CM

## 2019-05-27 DIAGNOSIS — Z0000 Encounter for general adult medical examination without abnormal findings: Secondary | ICD-10-CM

## 2019-05-27 DIAGNOSIS — Z579 Occupational exposure to unspecified risk factor: Secondary | ICD-10-CM | POA: Diagnosis not present

## 2019-05-27 DIAGNOSIS — I10 Essential (primary) hypertension: Secondary | ICD-10-CM

## 2019-05-27 DIAGNOSIS — Z87898 Personal history of other specified conditions: Secondary | ICD-10-CM

## 2019-05-27 DIAGNOSIS — Z130 Encounter for screening for diseases of the blood and blood-forming organs and certain disorders involving the immune mechanism: Secondary | ICD-10-CM

## 2019-05-27 DIAGNOSIS — Z13228 Encounter for screening for other metabolic disorders: Secondary | ICD-10-CM

## 2019-05-27 MED ORDER — EZETIMIBE 10 MG PO TABS: 10.0000 mg | ORAL_TABLET | Freq: Every day | ORAL | 3 refills | Status: DC

## 2019-05-27 NOTE — Progress Notes (Signed)
BP Readings from Last 3 Encounters:  05/27/19 135/78  11/28/18 123/80  08/11/18 (!) 148/87   Lab Results  Component Value Date   CHOL 167 11/28/2018   HDL 31 (L) 11/28/2018   LDLCALC 87 11/28/2018   TRIG 244 (H) 11/28/2018   CHOLHDL 5.4 (H) 11/28/2018   Wt Readings from Last 3 Encounters:  05/27/19 (!) 308 lb 11.2 oz (140 kg)  11/28/18 298 lb 9.6 oz (135.4 kg)  08/11/18 (!) 307 lb (139.3 kg)   Andrew [name]. 53 y.o.   Chief Complaint  Patient presents with  . Annual Exam    HISTORY OF PRESENT ILLNESS: This is a 53 y.o. male here for his annual exam.  Has no complaints or medical concerns.  Has the following medical problems: 1.  Hypertension: On Zestoretic.  Doing well.  No complaints. 2.  History of high triglycerides: Intolerant to statins.  No medication at present time. 3.  History of prediabetes. 4.  Family history of hypertension and dyslipidemia. 5.  Occupational exposure to asbestos and other chemicals.   HPI   Prior to Admission medications   Medication Sig Start Date End Date Taking? Authorizing Provider  aspirin 81 MG tablet Take 81 mg by mouth daily.     Yes [provider]  lisinopril-hydrochlorothiazide (PRINZIDE,ZESTORETIC) 10-12.5 MG tablet TAKE 1 TABLET BY MOUTH DAILY. 06/23/18  Yes Vanessia Bokhari, Ines Bloomer, MD  rosuvastatin (CRESTOR) 10 MG tablet Take 1 tablet (10 mg total) by mouth daily. Patient not taking: Reported on 05/27/2019 11/29/18   Horald Pollen, MD    Allergies  Allergen Reactions  . Crestor [Rosuvastatin Calcium] Other (See Comments)    Joint pain  . Testosterone Rash    SEVERE RASH WITH ITCHING (TOPICAL AND INJECTION)    Patient Active Problem List   Diagnosis Date Noted  . Prediabetes 11/28/2018  . OSA on CPAP 05/17/2017  . Morbid obesity ([place]) 05/17/2017  . Hydrocele, right 11/17/2015  . Hypertension 05/13/2012  . Hypogonadism male 05/13/2012    Past Medical History:  Diagnosis Date  . Epididymal  cyst    left  . Family history of premature CAD   . History of exercise intolerance    06-03-2015--  dr Angelena Form--  no ischemia or chest pain and no arrhythmias  . Hyperlipidemia   . Hypertension    cardiologist-  dr Angelena Form  . Left posterior fascicular block   . Obesity, Class III, BMI 40-49.9 (morbid obesity) ([place])   . Right hydrocele   . Wears glasses     Past Surgical History:  Procedure Laterality Date  . CARDIOVASCULAR STRESS TEST  04/23/2011   normal nuclear study w/ no ischemia (no change from previous exam)/  normal LV function and wall motion , ef 64%  . COLONOSCOPY  2012  . HERNIA REPAIR  infant  . HYDROCELE EXCISION Right 09/17/2016   Procedure: HYDROCELECTOMY ADULT;  Surgeon: Kathie Rhodes, MD;  Location: [facility];  Service: Urology;  Laterality: Right;  . WISDOM TOOTH EXTRACTION  2002    Social History   Socioeconomic History  . Marital status: Married    Spouse name: [name]  . Number of children: 1  . Years of education: College  . Highest education level: Not on file  Occupational History  . Occupation: Therapist, occupational: [employer]  Social Needs  . Financial resource strain: Not on file  . Food insecurity    Worry: Not on file  Inability: Not on file  . Transportation needs    Medical: Not on file    Non-medical: Not on file  Tobacco Use  . Smoking status: Never Smoker  . Smokeless tobacco: Never Used  Substance and Sexual Activity  . Alcohol use: Yes    Alcohol/week: 2.0 - 3.0 standard drinks    Types: 2 - 3 Standard drinks or equivalent per week    Comment: occasionaly beer (12 pk per month)  . Drug use: No  . Sexual activity: Never  Lifestyle  . Physical activity    Days per week: Not on file    Minutes per session: Not on file  . Stress: Not on file  Relationships  . Social Herbalist on phone: Not on file    Gets together: Not on file    Attends religious service: Not on file    Active  member of club or organization: Not on file    Attends meetings of clubs or organizations: Not on file    Relationship status: Not on file  . Intimate partner violence    Fear of current or ex partner: Not on file    Emotionally abused: Not on file    Physically abused: Not on file    Forced sexual activity: Not on file  Other Topics Concern  . Not on file  Social History Narrative   [name]) and lives at home with his wife and one child.   Patient is working full-time.   Patient has a college education.   Patient is right-handed.   Patient drinks two cups of coffee daily.   Exercise 2 times a week  On treadmill    Family History  Problem Relation Age of Onset  . Heart attack Mother   . COPD Mother   . Heart disease Mother   . Hyperlipidemia Mother   . Heart attack Father   . Heart disease Father   . Hyperlipidemia Father   . Hypertension Other      Review of Systems  Constitutional: Negative.  Negative for chills and fever.  HENT: Negative.  Negative for congestion and sore throat.   Eyes: Negative.   Respiratory: Negative.  Negative for cough and shortness of breath.   Cardiovascular: Negative.  Negative for chest pain and palpitations.  Gastrointestinal: Negative.  Negative for abdominal pain, blood in stool, diarrhea, melena, nausea and vomiting.  Genitourinary: Negative.   Musculoskeletal: Negative.   Skin: Negative.  Negative for rash.  Neurological: Negative.  Negative for dizziness and headaches.  Endo/Heme/Allergies: Negative.   All other systems reviewed and are negative.  Vitals:   05/27/19 0817  BP: 135/78  Pulse: 78  Resp: 16  Temp: 97.8 F (36.6 C)  SpO2: 96%     Physical Exam Vitals signs reviewed.  Constitutional:      Appearance: Normal appearance. He is obese.  HENT:     Head: Normocephalic and atraumatic.     Mouth/Throat:     Mouth: Mucous membranes are moist.     Pharynx: Oropharynx is clear.  Eyes:      Extraocular Movements: Extraocular movements intact.     Conjunctiva/sclera: Conjunctivae normal.     Pupils: Pupils are equal, round, and reactive to light.  Neck:     Musculoskeletal: Normal range of motion and neck supple.  Cardiovascular:     Rate and Rhythm: Normal rate and regular rhythm.     Pulses: Normal pulses.  Heart sounds: Normal heart sounds.  Pulmonary:     Effort: Pulmonary effort is normal.     Breath sounds: Normal breath sounds.  Abdominal:     General: Abdomen is flat. There is no distension.     Palpations: Abdomen is soft.     Tenderness: There is no abdominal tenderness.  Musculoskeletal: Normal range of motion.     Right lower leg: No edema.     Left lower leg: No edema.  Skin:    General: Skin is warm and dry.     Capillary Refill: Capillary refill takes less than 2 seconds.  Neurological:     General: No focal deficit present.     Mental Status: He is alert and oriented to person, place, and time.  Psychiatric:        Mood and Affect: Mood normal.        Behavior: Behavior normal.    Dg Chest 2 View  Result Date: 05/27/2019 CLINICAL DATA:  Occupational exposure, history hypertension EXAM: CHEST - 2 VIEW COMPARISON:  05/23/2018 FINDINGS: Normal heart size, mediastinal contours, and pulmonary vascularity. Lungs clear. No infiltrate, pleural effusion or pneumothorax. Bones unremarkable. IMPRESSION: No acute abnormalities. Electronically Signed   By: Lavonia Dana M.D.   On: 05/27/2019 09:29     ASSESSMENT & PLAN: [place] was seen today for annual exam.  Diagnoses and all orders for this visit:  Routine general medical examination at a health care facility  Essential hypertension -     Comprehensive metabolic panel  High triglycerides -     Lipid panel  Screening for deficiency anemia -     CBC with Differential/Platelet  Screening for endocrine, metabolic and immunity disorder -     Comprehensive metabolic panel  Occupational exposure in  workplace -     DG Chest 2 View; Future  History of prediabetes -     Hemoglobin A1c  Other orders -     ezetimibe (ZETIA) 10 MG tablet; Take 1 tablet (10 mg total) by mouth daily.    Patient Instructions       If you have lab work done today you will be contacted with your lab results within the next 2 weeks.  If you have not heard from [place] then please contact us. The fastest way to get your results is to register for My Chart.   IF you received an x-ray today, you will receive an invoice from [facility]. Please contact [facility] at [phone] with questions or concerns regarding your invoice.   IF you received labwork today, you will receive an invoice from [facility]. Please contact [facility] at [phone] with questions or concerns regarding your invoice.   Our billing staff will not be able to assist you with questions regarding bills from these companies.  You will be contacted with the lab results as soon as they are available. The fastest way to get your results is to activate your My Chart account. Instructions are located on the last page of this paperwork. If you have not heard from [place] regarding the results in 2 weeks, please contact this office.      Health Maintenance, Male Adopting a healthy lifestyle and getting preventive care are important in promoting health and wellness. Ask your health care provider about:  The right schedule for you to have regular tests and exams.  Things you can do on your own to prevent diseases and keep yourself healthy. What should I know about diet, weight, and  exercise? Eat a healthy diet   Eat a diet that includes plenty of vegetables, fruits, low-fat dairy products, and lean protein.  Do not eat a lot of foods that are high in solid fats, added sugars, or sodium. Maintain a healthy weight Body mass index (BMI) is a measurement that can be used to identify possible weight problems. It estimates body fat  based on height and weight. Your health care provider can help determine your BMI and help you achieve or maintain a healthy weight. Get regular exercise Get regular exercise. This is one of the most important things you can do for your health. Most adults should:  Exercise for at least 150 minutes each week. The exercise should increase your heart rate and make you sweat (moderate-intensity exercise).  Do strengthening exercises at least twice a week. This is in addition to the moderate-intensity exercise.  Spend less time sitting. Even light physical activity can be beneficial. Watch cholesterol and blood lipids Have your blood tested for lipids and cholesterol at 53 years of age, then have this test every 5 years. You may need to have your cholesterol levels checked more often if:  Your lipid or cholesterol levels are high.  You are older than 53 years of age.  You are at high risk for heart disease. What should I know about cancer screening? Many types of cancers can be detected early and may often be prevented. Depending on your health history and family history, you may need to have cancer screening at various ages. This may include screening for:  Colorectal cancer.  Prostate cancer.  Skin cancer.  Lung cancer. What should I know about heart disease, diabetes, and high blood pressure? Blood pressure and heart disease  High blood pressure causes heart disease and increases the risk of stroke. This is more likely to develop in people who have high blood pressure readings, are of African descent, or are overweight.  Talk with your health care provider about your target blood pressure readings.  Have your blood pressure checked: ? Every 3-5 years if you are 25-25 years of age. ? Every year if you are 53 years old or older.  If you are between the ages of 10 and 103 and are a current or former smoker, ask your health care provider if you should have a one-time screening for  abdominal aortic aneurysm (AAA). Diabetes Have regular diabetes screenings. This checks your fasting blood sugar level. Have the screening done:  Once every three years after age 14 if you are at a normal weight and have a low risk for diabetes.  More often and at a younger age if you are overweight or have a high risk for diabetes. What should I know about preventing infection? Hepatitis B If you have a higher risk for hepatitis B, you should be screened for this virus. Talk with your health care provider to find out if you are at risk for hepatitis B infection. Hepatitis C Blood testing is recommended for:  Everyone born from [birth year] through 1965.  Anyone with known risk factors for hepatitis C. Sexually transmitted infections (STIs)  You should be screened each year for STIs, including gonorrhea and chlamydia, if: ? You are sexually active and are younger than 53 years of age. ? You are older than 53 years of age and your health care provider tells you that you are at risk for this type of infection. ? Your sexual activity has changed since you were last screened, and  you are at increased risk for chlamydia or gonorrhea. Ask your health care provider if you are at risk.  Ask your health care provider about whether you are at high risk for HIV. Your health care provider may recommend a prescription medicine to help prevent HIV infection. If you choose to take medicine to prevent HIV, you should first get tested for HIV. You should then be tested every 3 months for as long as you are taking the medicine. Follow these instructions at home: Lifestyle  Do not use any products that contain nicotine or tobacco, such as cigarettes, e-cigarettes, and chewing tobacco. If you need help quitting, ask your health care provider.  Do not use street drugs.  Do not share needles.  Ask your health care provider for help if you need support or information about quitting drugs. Alcohol use  Do not  drink alcohol if your health care provider tells you not to drink.  If you drink alcohol: ? Limit how much you have to 0-2 drinks a day. ? Be aware of how much alcohol is in your drink. In the [place] one drink equals one 12 oz bottle of beer (355 mL), one 5 oz glass of wine (148 mL), or one 1 oz glass of hard liquor (44 mL). General instructions  Schedule regular health, dental, and eye exams.  Stay current with your vaccines.  Tell your health care provider if: ? You often feel depressed. ? You have ever been abused or do not feel safe at home. Summary  Adopting a healthy lifestyle and getting preventive care are important in promoting health and wellness.  Follow your health care provider's instructions about healthy diet, exercising, and getting tested or screened for diseases.  Follow your health care provider's instructions on monitoring your cholesterol and blood pressure. This information is not intended to replace advice given to you by your health care provider. Make sure you discuss any questions you have with your health care provider. Document Released: 04/12/2008 Document Revised: 10/08/2018 Document Reviewed: 10/08/2018 Elsevier Patient Education  2020 Elsevier Inc.      Agustina Caroli, MD [facility]

## 2019-05-27 NOTE — Patient Instructions (Addendum)
   If you have lab work done today you will be contacted with your lab results within the next 2 weeks.  If you have not heard from us then please contact us. The fastest way to get your results is to register for My Chart.   IF you received an x-ray today, you will receive an invoice from [facility]. Please contact [facility] at [phone] with questions or concerns regarding your invoice.   IF you received labwork today, you will receive an invoice from [facility]. Please contact [facility] at [phone] with questions or concerns regarding your invoice.   Our billing staff will not be able to assist you with questions regarding bills from these companies.  You will be contacted with the lab results as soon as they are available. The fastest way to get your results is to activate your My Chart account. Instructions are located on the last page of this paperwork. If you have not heard from us regarding the results in 2 weeks, please contact this office.     Health Maintenance, Male Adopting a healthy lifestyle and getting preventive care are important in promoting health and wellness. Ask your health care provider about:  The right schedule for you to have regular tests and exams.  Things you can do on your own to prevent diseases and keep yourself healthy. What should I know about diet, weight, and exercise? Eat a healthy diet   Eat a diet that includes plenty of vegetables, fruits, low-fat dairy products, and lean protein.  Do not eat a lot of foods that are high in solid fats, added sugars, or sodium. Maintain a healthy weight Body mass index (BMI) is a measurement that can be used to identify possible weight problems. It estimates body fat based on height and weight. Your health care provider can help determine your BMI and help you achieve or maintain a healthy weight. Get regular exercise Get regular exercise. This is one of the most important things you  can do for your health. Most adults should:  Exercise for at least 150 minutes each week. The exercise should increase your heart rate and make you sweat (moderate-intensity exercise).  Do strengthening exercises at least twice a week. This is in addition to the moderate-intensity exercise.  Spend less time sitting. Even light physical activity can be beneficial. Watch cholesterol and blood lipids Have your blood tested for lipids and cholesterol at 53 years of age, then have this test every 5 years. You may need to have your cholesterol levels checked more often if:  Your lipid or cholesterol levels are high.  You are older than 53 years of age.  You are at high risk for heart disease. What should I know about cancer screening? Many types of cancers can be detected early and may often be prevented. Depending on your health history and family history, you may need to have cancer screening at various ages. This may include screening for:  Colorectal cancer.  Prostate cancer.  Skin cancer.  Lung cancer. What should I know about heart disease, diabetes, and high blood pressure? Blood pressure and heart disease  High blood pressure causes heart disease and increases the risk of stroke. This is more likely to develop in people who have high blood pressure readings, are of African descent, or are overweight.  Talk with your health care provider about your target blood pressure readings.  Have your blood pressure checked: ? Every 3-5 years if you are 18-39 years   of age. ? Every year if you are 40 years old or older.  If you are between the ages of 65 and 75 and are a current or former smoker, ask your health care provider if you should have a one-time screening for abdominal aortic aneurysm (AAA). Diabetes Have regular diabetes screenings. This checks your fasting blood sugar level. Have the screening done:  Once every three years after age 45 if you are at a normal weight and have  a low risk for diabetes.  More often and at a younger age if you are overweight or have a high risk for diabetes. What should I know about preventing infection? Hepatitis B If you have a higher risk for hepatitis B, you should be screened for this virus. Talk with your health care provider to find out if you are at risk for hepatitis B infection. Hepatitis C Blood testing is recommended for:  Everyone born from 1945 through 1965.  Anyone with known risk factors for hepatitis C. Sexually transmitted infections (STIs)  You should be screened each year for STIs, including gonorrhea and chlamydia, if: ? You are sexually active and are younger than 53 years of age. ? You are older than 53 years of age and your health care provider tells you that you are at risk for this type of infection. ? Your sexual activity has changed since you were last screened, and you are at increased risk for chlamydia or gonorrhea. Ask your health care provider if you are at risk.  Ask your health care provider about whether you are at high risk for HIV. Your health care provider may recommend a prescription medicine to help prevent HIV infection. If you choose to take medicine to prevent HIV, you should first get tested for HIV. You should then be tested every 3 months for as long as you are taking the medicine. Follow these instructions at home: Lifestyle  Do not use any products that contain nicotine or tobacco, such as cigarettes, e-cigarettes, and chewing tobacco. If you need help quitting, ask your health care provider.  Do not use street drugs.  Do not share needles.  Ask your health care provider for help if you need support or information about quitting drugs. Alcohol use  Do not drink alcohol if your health care provider tells you not to drink.  If you drink alcohol: ? Limit how much you have to 0-2 drinks a day. ? Be aware of how much alcohol is in your drink. In the [place] one drink equals one 12  oz bottle of beer (355 mL), one 5 oz glass of wine (148 mL), or one 1 oz glass of hard liquor (44 mL). General instructions  Schedule regular health, dental, and eye exams.  Stay current with your vaccines.  Tell your health care provider if: ? You often feel depressed. ? You have ever been abused or do not feel safe at home. Summary  Adopting a healthy lifestyle and getting preventive care are important in promoting health and wellness.  Follow your health care provider's instructions about healthy diet, exercising, and getting tested or screened for diseases.  Follow your health care provider's instructions on monitoring your cholesterol and blood pressure. This information is not intended to replace advice given to you by your health care provider. Make sure you discuss any questions you have with your health care provider. Document Released: 04/12/2008 Document Revised: 10/08/2018 Document Reviewed: 10/08/2018 Elsevier Patient Education  2020 Elsevier Inc.  

## 2019-05-28 ENCOUNTER — Other Ambulatory Visit: Payer: Self-pay | Admitting: Emergency Medicine

## 2019-05-28 LAB — COMPREHENSIVE METABOLIC PANEL
ALT: 45 IU/L — ABNORMAL HIGH (ref 0–44)
AST: 28 IU/L (ref 0–40)
Albumin/Globulin Ratio: 1.8 (ref 1.2–2.2)
Albumin: 4.6 g/dL (ref 3.8–4.9)
Alkaline Phosphatase: 56 IU/L (ref 39–117)
BUN/Creatinine Ratio: 23 — ABNORMAL HIGH (ref 9–20)
BUN: 20 mg/dL (ref 6–24)
Bilirubin Total: 0.7 mg/dL (ref 0.0–1.2)
CO2: 24 mmol/L (ref 20–29)
Calcium: 9.6 mg/dL (ref 8.7–10.2)
Chloride: 99 mmol/L (ref 96–106)
Creatinine, Ser: 0.87 mg/dL (ref 0.76–1.27)
GFR calc Af Amer: 115 mL/min/{1.73_m2} (ref >59)
GFR calc non Af Amer: 99 mL/min/{1.73_m2} (ref >59)
Globulin, Total: 2.5 g/dL (ref 1.5–4.5)
Glucose: 133 mg/dL — ABNORMAL HIGH (ref 65–99)
Potassium: 4.4 mmol/L (ref 3.5–5.2)
Sodium: 138 mmol/L (ref 134–144)
Total Protein: 7.1 g/dL (ref 6.0–8.5)

## 2019-05-28 LAB — LIPID PANEL
Chol/HDL Ratio: 5.4 ratio — ABNORMAL HIGH (ref 0.0–5.0)
Cholesterol, Total: 168 mg/dL (ref 100–199)
HDL: 31 mg/dL — ABNORMAL LOW (ref >39)
LDL Calculated: 73 mg/dL (ref 0–99)
Triglycerides: 321 mg/dL — ABNORMAL HIGH (ref 0–149)
VLDL Cholesterol Cal: 64 mg/dL — ABNORMAL HIGH (ref 5–40)

## 2019-05-28 LAB — CBC WITH DIFFERENTIAL/PLATELET
Basophils Absolute: 0 10*3/uL (ref 0.0–0.2)
Basos: 0 %
EOS (ABSOLUTE): 0.1 10*3/uL (ref 0.0–0.4)
Eos: 3 %
Hematocrit: 47.5 % (ref 37.5–51.0)
Hemoglobin: 16.2 g/dL (ref 13.0–17.7)
Immature Grans (Abs): 0 10*3/uL (ref 0.0–0.1)
Immature Granulocytes: 0 %
Lymphocytes Absolute: 1.5 10*3/uL (ref 0.7–3.1)
Lymphs: 27 %
MCH: 32.6 pg (ref 26.6–33.0)
MCHC: 34.1 g/dL (ref 31.5–35.7)
MCV: 96 fL (ref 79–97)
Monocytes Absolute: 0.3 10*3/uL (ref 0.1–0.9)
Monocytes: 5 %
Neutrophils Absolute: 3.5 10*3/uL (ref 1.4–7.0)
Neutrophils: 65 %
Platelets: 192 10*3/uL (ref 150–450)
RBC: 4.97 x10E6/uL (ref 4.14–5.80)
RDW: 12.8 % (ref 11.6–15.4)
WBC: 5.5 10*3/uL (ref 3.4–10.8)

## 2019-05-28 LAB — HEMOGLOBIN A1C
Est. average glucose Bld gHb Est-mCnc: 151 mg/dL
Hgb A1c MFr Bld: 6.9 % — ABNORMAL HIGH (ref 4.8–5.6)

## 2019-05-28 MED ORDER — METFORMIN HCL 500 MG PO TABS: 500.0000 mg | ORAL_TABLET | Freq: Two times a day (BID) | ORAL | 3 refills | Status: DC

## 2019-05-28 NOTE — Progress Notes (Signed)
Spoke with pt, verbalized understanding.

## 2019-05-29 ENCOUNTER — Encounter: Payer: BLUE CROSS/BLUE SHIELD | Admitting: Emergency Medicine

## 2019-08-06 ENCOUNTER — Other Ambulatory Visit: Payer: Self-pay

## 2019-08-06 ENCOUNTER — Ambulatory Visit
Admission: RE | Admit: 2019-08-06 | Discharge: 2019-08-06 | Disposition: A | Discharge status: Home / Self Care | Payer: Self-pay | Source: Ambulatory Visit | Attending: Cardiovascular Disease | Admitting: Cardiovascular Disease

## 2019-08-06 ENCOUNTER — Encounter: Payer: Self-pay | Admitting: Cardiovascular Disease

## 2019-08-06 ENCOUNTER — Ambulatory Visit: Payer: BLUE CROSS/BLUE SHIELD | Admitting: Cardiovascular Disease

## 2019-08-06 VITALS — BP 124/94 | HR 75 | Ht 67.6 in | Wt 302.0 lb

## 2019-08-06 DIAGNOSIS — R079 Chest pain, unspecified: Secondary | ICD-10-CM | POA: Diagnosis not present

## 2019-08-06 DIAGNOSIS — I10 Essential (primary) hypertension: Secondary | ICD-10-CM

## 2019-08-06 NOTE — Progress Notes (Signed)
Chief Complaint  Patient presents with  . Follow-up    HTN   History of Present Illness: 53 yo male with history of HTN who is here today for cardiac follow up. I saw him in June 2012 to establish cardiology care given his strong FH of CAD. Both of his parents had premature CAD. At his first visit, he reported lack of energy and constant chest pressure.  Stress myoview 2012 with no ischemia. LVEF normal.  Exercise stress test August 2016 and April 2018 with no ischemia. He was started on Crestor in primary care and did not tolerate. He is now on Zetia and tolerating.   He is here today for follow up. The patient denies any chest pain, dyspnea, palpitations, lower extremity edema, orthopnea, PND, dizziness, near syncope or syncope.     Primary Care Physician: Horald Pollen, MD  Past Medical History:  Diagnosis Date  . Epididymal cyst    left  . Family history of premature CAD   . History of exercise intolerance    06-03-2015--  dr Angelena Form--  no ischemia or chest pain and no arrhythmias  . Hyperlipidemia   . Hypertension    cardiologist-  dr Angelena Form  . Left posterior fascicular block   . Obesity, Class III, BMI 40-49.9 (morbid obesity) ([place])   . Right hydrocele   . Wears glasses     Past Surgical History:  Procedure Laterality Date  . CARDIOVASCULAR STRESS TEST  04/23/2011   normal nuclear study w/ no ischemia (no change from previous exam)/  normal LV function and wall motion , ef 64%  . COLONOSCOPY  2012  . HERNIA REPAIR  infant  . HYDROCELE EXCISION Right 09/17/2016   Procedure: HYDROCELECTOMY ADULT;  Surgeon: Kathie Rhodes, MD;  Location: [facility];  Service: Urology;  Laterality: Right;  . WISDOM TOOTH EXTRACTION  2002    Current Outpatient Medications  Medication Sig Dispense Refill  . aspirin 81 MG tablet Take 81 mg by mouth daily.      [name] (ZETIA) 10 MG tablet Take 1 tablet (10 mg total) by mouth daily. 90 tablet 3  .  lisinopril-hydrochlorothiazide (PRINZIDE,ZESTORETIC) 10-12.5 MG tablet TAKE 1 TABLET BY MOUTH DAILY. 90 tablet 1   No current facility-administered medications for this visit.     Allergies  Allergen Reactions  . Crestor [Rosuvastatin Calcium] Other (See Comments)    Joint pain  . Testosterone Rash    SEVERE RASH WITH ITCHING (TOPICAL AND INJECTION)    Social History   Socioeconomic History  . Marital status: Married    Spouse name: [name]  . Number of children: 1  . Years of education: College  . Highest education level: Not on file  Occupational History  . Occupation: Therapist, occupational: [employer]  Social Needs  . Financial resource strain: Not on file  . Food insecurity    Worry: Not on file    Inability: Not on file  . Transportation needs    Medical: Not on file    Non-medical: Not on file  Tobacco Use  . Smoking status: Never Smoker  . Smokeless tobacco: Never Used  Substance and Sexual Activity  . Alcohol use: Yes    Alcohol/week: 2.0 - 3.0 standard drinks    Types: 2 - 3 Standard drinks or equivalent per week    Comment: occasionaly beer (12 pk per month)  . Drug use: No  . Sexual activity: Never  Lifestyle  . Physical activity    Days per week: Not on file    Minutes per session: Not on file  . Stress: Not on file  Relationships  . Social Herbalist on phone: Not on file    Gets together: Not on file    Attends religious service: Not on file    Active member of club or organization: Not on file    Attends meetings of clubs or organizations: Not on file    Relationship status: Not on file  . Intimate partner violence    Fear of current or ex partner: Not on file    Emotionally abused: Not on file    Physically abused: Not on file    Forced sexual activity: Not on file  Other Topics Concern  . Not on file  Social History Narrative   [name]) and lives at home with his wife and one child.   Patient is  working full-time.   Patient has a college education.   Patient is right-handed.   Patient drinks two cups of coffee daily.   Exercise 2 times a week  On treadmill    Family History  Problem Relation Age of Onset  . Heart attack Mother   . COPD Mother   . Heart disease Mother   . Hyperlipidemia Mother   . Heart attack Father   . Heart disease Father   . Hyperlipidemia Father   . Hypertension Other     Review of Systems:  As stated in the HPI and otherwise negative.   BP (!) 124/94   Pulse 75   Ht 5' 7.6" (1.717 m)   Wt (!) 302 lb (137 kg)   BMI 46.46 kg/m   Physical Examination: General: Well developed, well nourished, NAD  HEENT: OP clear, mucus membranes moist  SKIN: warm, dry. No rashes. Neuro: No focal deficits  Musculoskeletal: Muscle strength 5/5 all ext  Psychiatric: Mood and affect normal  Neck: No JVD, no carotid bruits, no thyromegaly, no lymphadenopathy.  Lungs:Clear bilaterally, no wheezes, rhonci, crackles Cardiovascular: Regular rate and rhythm. No murmurs, gallops or rubs. Abdomen:Soft. Bowel sounds present. Non-tender.  Extremities: No lower extremity edema. Pulses are 2 + in the bilateral DP/PT.  EKG:  EKG is ordered today.  The EKG is personally reviewed and shows NSR  Recent Labs: 05/27/2019: ALT 45; BUN 20; Creatinine, Ser 0.87; Hemoglobin 16.2; Platelets 192; Potassium 4.4; Sodium 138   Lipid Panel    Component Value Date/Time   CHOL 168 05/27/2019 0855   TRIG 321 (H) 05/27/2019 0855   HDL 31 (L) 05/27/2019 0855   CHOLHDL 5.4 (H) 05/27/2019 0855   CHOLHDL 5.4 (H) 08/17/2016 0847   VLDL 53 (H) 08/17/2016 0847   LDLCALC 73 05/27/2019 0855     Wt Readings from Last 3 Encounters:  08/06/19 (!) 302 lb (137 kg)  05/27/19 (!) 308 lb 11.2 oz (140 kg)  11/28/18 298 lb 9.6 oz (135.4 kg)     Other studies Reviewed: Additional studies/ records that were reviewed today include: . Review of the above records demonstrates:    Assessment and  Plan:   1. Chest pain: No recent chest pain. Normal stress testing in 2018. In the past we discussed a coronary CTA to assess for CAD given his strong family history of CAD but he has had no symptoms. Will arrange calcium score CT,   2. HTN: BP has been well controlled. He has missed some  doses of his anti-hypertensive medication. He will buy a BP cuff for home and follow it at home.   Current medicines are reviewed at length with the patient today.  The patient does not have concerns regarding medicines.  The following changes have been made:  no change  Labs/ tests ordered today include:   Orders Placed This Encounter  Procedures  . CT CARDIAC SCORING  . EKG 12-Lead    Disposition:   FU with me in 12  months  Signed, Lauree Chandler, MD 08/06/2019 9:34 AM    [facility] [address], [place], [place]  [address] Phone: [phone]; Fax: [phone]

## 2019-08-06 NOTE — Patient Instructions (Signed)
Medication Instructions:  No changes If you need a refill on your cardiac medications before your next appointment, please call your pharmacy.   Lab work: none If you have labs (blood work) drawn today and your tests are completely normal, you will receive your results only by: Marland Kitchen MyChart Message (if you have MyChart) OR . A paper copy in the mail If you have any lab test that is abnormal or we need to change your treatment, we will call you to review the results.  Testing/Procedures: A calcium score ct scan has been recommended today.  There is a $150 out of pocket charge for this scan.  Follow-Up: At [facility] you and your health needs are our priority.  As part of our continuing mission to provide you with exceptional heart care, we have created designated Provider Care Teams.  These Care Teams include your primary Cardiologist (physician) and Advanced Practice Providers (APPs -  Physician Assistants and Nurse Practitioners) who all work together to provide you with the care you need, when you need it. You will need a follow up appointment in 12 months.  Please call our office 2 months in advance to schedule this appointment.  You may see Lauree Chandler, MD or one of the following Advanced Practice Providers on your designated Care Team:   [place], PA-C Melina Copa, PA-C . Ermalinda Barrios, PA-C  Any Other Special Instructions Will Be Listed Below (If Applicable).

## 2019-08-07 ENCOUNTER — Telehealth: Payer: Self-pay | Admitting: *Deleted

## 2019-08-07 DIAGNOSIS — R911 Solitary pulmonary nodule: Secondary | ICD-10-CM

## 2019-08-07 NOTE — Telephone Encounter (Signed)
Left message to call office

## 2019-08-07 NOTE — Telephone Encounter (Signed)
-----   Message from Burnell Blanks, MD sent at 08/07/2019  9:45 AM EDT ----- Very low calcium score which suggests there is likely very minor coronary artery disease so we need to really make sure he watches his diet and weight, continue to keep cholesterol down and keep blood pressure controlled. This low calcium score would not warrant further cardiac workup at this time. Also small nodule in the lung so will need repeat Chest CT in one year to follow. Gerald Stabs

## 2019-08-07 NOTE — Telephone Encounter (Signed)
I spoke with pt and reviewed results with him.  Order placed for Chest CT in one year.

## 2019-08-24 ENCOUNTER — Ambulatory Visit: Payer: BLUE CROSS/BLUE SHIELD | Admitting: Neurology

## 2019-08-24 ENCOUNTER — Other Ambulatory Visit: Payer: Self-pay

## 2019-08-24 ENCOUNTER — Encounter: Payer: Self-pay | Admitting: Neurology

## 2019-08-24 VITALS — BP 149/86 | HR 70 | Temp 98.0°F | Ht 67.5 in | Wt 308.0 lb

## 2019-08-24 DIAGNOSIS — I10 Essential (primary) hypertension: Secondary | ICD-10-CM | POA: Diagnosis not present

## 2019-08-24 DIAGNOSIS — Z9989 Dependence on other enabling machines and devices: Secondary | ICD-10-CM

## 2019-08-24 DIAGNOSIS — G4733 Obstructive sleep apnea (adult) (pediatric): Secondary | ICD-10-CM

## 2019-08-24 DIAGNOSIS — R7303 Prediabetes: Secondary | ICD-10-CM

## 2019-08-24 NOTE — Progress Notes (Signed)
SLEEP MED[facility]INE CLINIC   Provider:  Larey Seat, M D  Primary Care Physician:   Referring Provider: Dr. Mitchel Honour.    Chief Complaint  Patient presents with  . Follow-up    pt alone, rm 11. machine working well no concerns.    HPI:  Andrew [name]. is a 53 y.o. male and is seen here 08-24-2019, in a yearly RV for CPAP compliance. He had questions about the [place]. He also has purchased a new mask online recently.   Andrew [name] has been 100% compliant CPAP user his data includes 31 July 2019 at the 30 days prior.  His average use at time of 7 hours 37 minutes.  He is using an air sense 10 AutoSet with 5 to 20 cm pressure window and an EPR level of 3 the 95th percentile pressure is 11 cmH2O which allows [place] to reduce his maximum pressure setting to 15.  He has a residual AHI of 0.6/h which indicates excellent control of his apnea.  There are no central apneas emerging no Cheyne-Stokes respirations are noted, he does have moderate air leakage, his AHI again is excellent.  I suggested we are setting the minimum pressure to 7 cm water and the maximum pressure to 15 with a 2 cm EPR.     Rv. 08-11-2018, he asks for a prescription to get supplies through the internet. This is a revisit for Andrew [name]. Andrew [name]., [name] by 53 year old Caucasian right-handed male with a long-standing history of CPAP use in the treatment of obstructive sleep apnea.  He was originally referred to me in 2015 after having followed Dr. Everlene Farrier. He has remained 100% compliant CPAP user with an average time of use of 7 hours 32 minutes at night, his AutoSet is set between 5 and 20 cmH2O with 3 cm EPR, his 95th percentile pressure is 10.9 cmH2O, his residual AHI is only 0.7/h.  There are no central apneas arising and there are no major air leaks.  He needs new supplies.  He has had some bad experience with his current durable medical equipment company which apparently communicated rather poorly with him.    HPI:  03-02-2014 -Dr. Everlene Farrier has followed this patient for many years. He had an episode of chest pain and was evaluated by cardiology. Given his family history of  cardiopulmonary disease , he was also asked to undergo a sleep study. However, due to financial reasons he was not able to do it at the time it was originally ordered. He was concerned about the costs. Dr Everlene Farrier was testing him for fatigue and sleepiness, and found to have hypo-testosteroemia. He could not tolerate the testosterone supplements due to severe itching.    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 He aims for 10 PM and rises most morning at 6.30 , spontaneously , with a back up alarm. He is on call frequently, on average 7-8 hours of sleep.  No nocturia, no morning headaches, but a dry mouth  Loud snoring reported when on his back.  His bedroom is cool, quiet, dark.He shares the bed room with his wife.  I had the pleasure of seeing Andrew [name] over 3 years ago for the same symptoms and reasons that I see him today. At the time he ordered a  sleep study but his insurance was not willing to pay for it. He has tried to postpone further evaluation but now feels that he has to get it done. He was seen on 08/17/2016 last by his primary care provider who refilled his antihypertensive medications. He encourage the patient to think about bariatric surgery and the patient has already had an appointment set up. The patient continues to work in a physical job and joined the [employer] for [employer] and cardio training with a final goal of weight loss.  He continues to present with a past medical history of allergies with respiratory impact, gastro-esophageal reflux disease, hyperlipidemia, hypertension on medication and obesity. He is not diabetic father and mother  had heart disease and his mother had COPD and post parents also suffered from hyperlipidemia.His sister and mother had OSA and were on CPAP.   2nd October 2018. I had the pleasure of following up with Andrew [name] on his recent sleep studies. He was diagnosed with obstructive sleep apnea by home sleep test on 04/10/2017, medical history was reviewed, Epworth sleepiness score was endorsed at 14 points. The patient's AHI was 35.1 SPO2 nadir was 74%, total desaturation time at or below 88% was 52 minutes. No tachybradycardia arrhythmia noted. I recommended a CPAP titration was baseline and capnography which was declined by his insurance. We therefore used an auto titrate CPAP.  A compliance download from outdoor titration CPAP was obtained, the AutoSet was between 5 and 20 cm water with 3 cm EPR, total user time on average 7 hours and 6 minutes, 100% compliance for days, 97% compliance bedtime. There was only one day with the patient still short of a four-hour mark. Residual AHI is 0.3. The patient's 95th percentile pressure was 12.6/ hr.  he does have some higher air leaks using a nasal pillow. He uses now a nasal mask , large mask. He has now plans for bariatric surgery- and the auto-titrator will come in handy. He feels better , more energized, sleeps more soundly and his wife is happy that snoring stopped. No more nocturia.   Andrew [name] has a family history of sudden cardiac death affecting his father at age 22. He has followed up with a cardiologist yearly.   for the first time the patient endorses a very high degree of sleepiness, he did not feel the same way 3 years ago when I last saw him.   I think that a sleep study is urgently needed also in preparation of bariatric surgery. He will have an appointment with his bariatric surgeon June 1. It would be lovely if he could do a split-night polysomnography before.   he is restless and his wife reports PLMs.  This cannot be appreciated in a  HST>   Soc: ETOH 12 drinks a month, no tobacco use, caffeine - a lot, Coffee 4-6 a day. 3 diet cokes a week.  No pets , daughter is 56 and lives at home.    Review of Systems: Out of a complete 14 system review, the patient complains of only the following symptoms, and all other reviewed systems are negative.   The patient grew up with passive smoke exposure but has never smoked. He endorsed the Epworth sleepiness score today at 14 points out of 24 and he has fallen asleep at the wheel which urged this reevaluation now. His fatigue degree is 36. He is not depressed. He snores, he has apnea.   How likely are you to doze in the  following situations: 0 = not likely, 1 = slight chance, 2 = moderate chance, 3 = high chance  Sitting and Reading? Watching Television? Sitting inactive in a public place (theater or meeting)? Lying down in the afternoon when circumstances permit? Sitting and talking to someone? Sitting quietly after lunch without alcohol? In a car, while stopped for a few minutes in traffic? As a passenger in a car for an hour without a break?  Total = 4/ 24 on CPAP  FSS 4/ 63     Social History   Socioeconomic History  . Marital status: Married    Spouse name: [name]  . Number of children: 1  . Years of education: College  . Highest education level: Not on file  Occupational History  . Occupation: Therapist, occupational: [employer]  Social Needs  . Financial resource strain: Not on file  . Food insecurity    Worry: Not on file    Inability: Not on file  . Transportation needs    Medical: Not on file    Non-medical: Not on file  Tobacco Use  . Smoking status: Never Smoker  . Smokeless tobacco: Never Used  Substance and Sexual Activity  . Alcohol use: Yes    Alcohol/week: 2.0 - 3.0 standard drinks    Types: 2 - 3 Standard drinks or equivalent per week    Comment: occasionaly beer (12 pk per month)  . Drug use: No  . Sexual activity: Never   Lifestyle  . Physical activity    Days per week: Not on file    Minutes per session: Not on file  . Stress: Not on file  Relationships  . Social Herbalist on phone: Not on file    Gets together: Not on file    Attends religious service: Not on file    Active member of club or organization: Not on file    Attends meetings of clubs or organizations: Not on file    Relationship status: Not on file  . Intimate partner violence    Fear of current or ex partner: Not on file    Emotionally abused: Not on file    Physically abused: Not on file    Forced sexual activity: Not on file  Other Topics Concern  . Not on file  Social History Narrative   [name]) and lives at home with his wife and one child.   Patient is working full-time.   Patient has a college education.   Patient is right-handed.   Patient drinks two cups of coffee daily.   Exercise 2 times a week  On treadmill    Family History  Problem Relation Age of Onset  . Heart attack Mother   . COPD Mother   . Heart disease Mother   . Hyperlipidemia Mother   . Heart attack Father   . Heart disease Father   . Hyperlipidemia Father   . Hypertension Other     Past Medical History:  Diagnosis Date  . Epididymal cyst    left  . Family history of premature CAD   . History of exercise intolerance    06-03-2015--  dr Angelena Form--  no ischemia or chest pain and no arrhythmias  . Hyperlipidemia   . Hypertension    cardiologist-  dr Angelena Form  . Left posterior fascicular block   . Obesity, Class III, BMI 40-49.9 (morbid obesity) ([place])   . Right hydrocele   .  Wears glasses     Past Surgical History:  Procedure Laterality Date  . CARDIOVASCULAR STRESS TEST  04/23/2011   normal nuclear study w/ no ischemia (no change from previous exam)/  normal LV function and wall motion , ef 64%  . COLONOSCOPY  2012  . HERNIA REPAIR  infant  . HYDROCELE EXCISION Right 09/17/2016   Procedure:  HYDROCELECTOMY ADULT;  Surgeon: Kathie Rhodes, MD;  Location: [facility];  Service: Urology;  Laterality: Right;  . WISDOM TOOTH EXTRACTION  2002    Current Outpatient Medications  Medication Sig Dispense Refill  . aspirin 81 MG tablet Take 81 mg by mouth daily.      [name] (ZETIA) 10 MG tablet Take 1 tablet (10 mg total) by mouth daily. 90 tablet 3  . lisinopril-hydrochlorothiazide (PRINZIDE,ZESTORETIC) 10-12.5 MG tablet TAKE 1 TABLET BY MOUTH DAILY. 90 tablet 1   No current facility-administered medications for this visit.     Allergies as of 08/24/2019 - Review Complete 08/24/2019  Allergen Reaction Noted  . Crestor [rosuvastatin calcium] Other (See Comments) 05/27/2019  . Testosterone Rash 02/03/2013    Vitals: BP (!) 149/86   Pulse 70   Temp 98 F (36.7 C)   Ht 5' 7.5" (1.715 m)   Wt (!) 308 lb (139.7 kg)   BMI 47.53 kg/m  Last Weight:  Wt Readings from Last 1 Encounters:  08/24/19 (!) 308 lb (139.7 kg)   Last Height:   Ht Readings from Last 1 Encounters:  08/24/19 5' 7.5" (1.715 m)    Physical exam:  General: The patient is awake, alert and appears not in acute distress. The patient is well groomed. Head: Normocephalic, atraumatic. Neck is supple. Mallampati 5, neck circumference:21.5  inches , no jaw lock. No retrognathia -but crowded airway.  Cardiovascular:  Regular rate and rhythm, without murmurs or carotid bruit, and without distended neck veins. Respiratory: Lungs are clear to auscultation.Skin:  Without evidence of edema, or rash. Trunk: BMI is severely elevated ( super obese at 47 ) His posture is erect.  Neurologic exam :The patient is awake and alert, but struggles to stay awake- oriented to place and time.   There is a normal attention span & concentration ability. Speech is fluent without  dysarthria, dysphonia or aphasia. Mood and affect are appropriate.  Cranial nerves:  Taste and smell sense intact -Pupils are equal and briskly  reactive to light.  Facial sensation intact to fine touch. Facial motor strength is symmetric and tongue moves midline. I cannot appreciate the uvula.  Motor exam:  Normal tone and normal muscle bulk , symmetric strength in all four extremities. Strong grip strength. Sensory:  Fine touch, pinprick and vibration were tested in all extremities.  Gait and station: Patient walks without assistive device . Deep tendon reflexes: in the upper and lower extremities are symmetric and intact.   Assessment:  After physical and neurologic examination, review of laboratory studies, imaging, neurophysiology testing and pre-existing records, assessment is  1) Patient with moderatly- severe  OSA, by BMI and neck size, and by history of snoring and witnessed apnea. 2) Doing well on Autotitration- but very unhappy with [employer], sending bills to the wrong address and not calling him ( [employer] had the Telephone ).   Plan:  Treatment plan and additional workup :   1) Continue with Auto-titration- I will reset pressures to 7 through 15 cm water , 2 cm EPR.   Patient wants to change to another DME-  He  used a CPAP internet company.    Rv with NP .    Please offer virtual visit.      Larey Seat, MD

## 2019-08-24 NOTE — Patient Instructions (Signed)

## 2019-09-14 ENCOUNTER — Telehealth: Payer: Self-pay | Admitting: Emergency Medicine

## 2019-09-14 NOTE — Telephone Encounter (Signed)
LVM to r/s appt on 11/27/2019 with dr. Mitchel Honour. The provider will be out of the office on that day

## 2019-11-27 ENCOUNTER — Ambulatory Visit: Payer: BLUE CROSS/BLUE SHIELD | Admitting: Emergency Medicine

## 2019-11-30 ENCOUNTER — Ambulatory Visit (INDEPENDENT_AMBULATORY_CARE_PROVIDER_SITE_OTHER): Payer: BC Managed Care – PPO | Admitting: Emergency Medicine

## 2019-11-30 ENCOUNTER — Encounter: Payer: Self-pay | Admitting: Emergency Medicine

## 2019-11-30 ENCOUNTER — Other Ambulatory Visit: Payer: Self-pay

## 2019-11-30 VITALS — BP 120/82 | HR 75 | Temp 98.9°F | Resp 16 | Ht 67.0 in | Wt 302.0 lb

## 2019-11-30 DIAGNOSIS — I10 Essential (primary) hypertension: Secondary | ICD-10-CM | POA: Diagnosis not present

## 2019-11-30 DIAGNOSIS — E1159 Type 2 diabetes mellitus with other circulatory complications: Secondary | ICD-10-CM

## 2019-11-30 DIAGNOSIS — E781 Pure hyperglyceridemia: Secondary | ICD-10-CM | POA: Insufficient documentation

## 2019-11-30 DIAGNOSIS — I152 Hypertension secondary to endocrine disorders: Secondary | ICD-10-CM

## 2019-11-30 DIAGNOSIS — E1165 Type 2 diabetes mellitus with hyperglycemia: Secondary | ICD-10-CM

## 2019-11-30 LAB — GLUCOSE, POCT (MANUAL RESULT ENTRY): POC Glucose: 127 mg/dl — AB (ref 70–99)

## 2019-11-30 LAB — POCT GLYCOSYLATED HEMOGLOBIN (HGB A1C): Hemoglobin A1C: 6.7 % — AB (ref 4.0–5.6)

## 2019-11-30 MED ORDER — LISINOPRIL-HYDROCHLOROTHIAZIDE 20-12.5 MG PO TABS: 1.0000 | ORAL_TABLET | Freq: Every day | ORAL | 3 refills | Status: DC

## 2019-11-30 MED ORDER — LISINOPRIL-HYDROCHLOROTHIAZIDE 10-12.5 MG PO TABS: 1.0000 | ORAL_TABLET | Freq: Every day | ORAL | 3 refills | Status: DC

## 2019-11-30 MED ORDER — METFORMIN HCL 500 MG PO TABS: 500.0000 mg | ORAL_TABLET | Freq: Two times a day (BID) | ORAL | 3 refills | Status: DC

## 2019-11-30 NOTE — Progress Notes (Signed)
Andrew [name]. 54 y.o.   Chief Complaint  Patient presents with   Hypertension    6 month follow up after physical   Medication Refill    Lisinopril-hctz    HISTORY OF PRESENT ILLNESS: This is a 54 y.o. male with history of hypertension, prediabetes and high triglycerides here for follow-up and medication refill. 1.  Hypertension: On Zestoretic 10-12.5 mg daily. BP Readings from Last 3 Encounters:  11/30/19 (!) 145/90  08/24/19 (!) 149/86  08/06/19 (!) 124/94    2.  Prediabetes: No medications. Lab Results  Component Value Date   HGBA1C 6.9 (H) 05/27/2019   Wt Readings from Last 3 Encounters:  11/30/19 (!) 302 lb (137 kg)  08/24/19 (!) 308 lb (139.7 kg)  08/06/19 (!) 302 lb (137 kg)     3.  History of high triglycerides: On Zetia 10 mg daily Lab Results  Component Value Date   CHOL 168 05/27/2019   HDL 31 (L) 05/27/2019   LDLCALC 73 05/27/2019   TRIG 321 (H) 05/27/2019   CHOLHDL 5.4 (H) 05/27/2019    Has no complaints or medical concerns today.  HPI   Prior to Admission medications   Medication Sig Start Date End Date Taking? Authorizing Provider  aspirin 81 MG tablet Take 81 mg by mouth daily.     Yes [provider]  ezetimibe (ZETIA) 10 MG tablet Take 1 tablet (10 mg total) by mouth daily. 05/27/19  Yes Lillyahna Hemberger, Ines Bloomer, MD  lisinopril-hydrochlorothiazide (PRINZIDE,ZESTORETIC) 10-12.5 MG tablet TAKE 1 TABLET BY MOUTH DAILY. 06/23/18  Yes Horald Pollen, MD    Allergies  Allergen Reactions   Crestor [Rosuvastatin Calcium] Other (See Comments)    Joint pain   Testosterone Rash    SEVERE RASH WITH ITCHING (TOPICAL AND INJECTION)    Patient Active Problem List   Diagnosis Date Noted   Prediabetes 11/28/2018   OSA on CPAP 05/17/2017   Morbid obesity ([place]) 05/17/2017   Hydrocele, right 11/17/2015   Hypertension 05/13/2012   Hypogonadism male 05/13/2012    Past Medical History:  Diagnosis Date   Epididymal  cyst    left   Family history of premature CAD    History of exercise intolerance    06-03-2015--  dr Angelena Form--  no ischemia or chest pain and no arrhythmias   Hyperlipidemia    Hypertension    cardiologist-  dr Angelena Form   Left posterior fascicular block    Obesity, Class III, BMI 40-49.9 (morbid obesity) ([place])    Right hydrocele    Wears glasses     Past Surgical History:  Procedure Laterality Date   CARDIOVASCULAR STRESS TEST  04/23/2011   normal nuclear study w/ no ischemia (no change from previous exam)/  normal LV function and wall motion , ef 64%   COLONOSCOPY  2012   HERNIA REPAIR  infant   HYDROCELE EXCISION Right 09/17/2016   Procedure: HYDROCELECTOMY ADULT;  Surgeon: Kathie Rhodes, MD;  Location: [facility];  Service: Urology;  Laterality: Right;   WISDOM TOOTH EXTRACTION  2002    Social History   Socioeconomic History   Marital status: Married    Spouse name: [name]   Number of children: 1   Years of education: College   Highest education level: Not on file  Occupational History   Occupation: Therapist, occupational: [employer]  Tobacco Use   Smoking status: Never Smoker   Smokeless tobacco: Never Used  Substance and  Sexual Activity   Alcohol use: Yes    Alcohol/week: 2.0 - 3.0 standard drinks    Types: 2 - 3 Standard drinks or equivalent per week    Comment: occasionaly beer (12 pk per month)   Drug use: No   Sexual activity: Never  Other Topics Concern   Not on file  Social History Narrative   [name]) and lives at home with his wife and one child.   Patient is working full-time.   Patient has a college education.   Patient is right-handed.   Patient drinks two cups of coffee daily.   Exercise 2 times a week  On treadmill   Social Determinants of Health   Financial Resource Strain:    Difficulty of Paying Living Expenses: Not on file  Food Insecurity:    Worried About  [place] in the Last Year: Not on file   Ran Out of Food in the Last Year: Not on file  Transportation Needs:    Lack of Transportation (Medical): Not on file   Lack of Transportation (Non-Medical): Not on file  Physical Activity:    Days of Exercise per Week: Not on file   Minutes of Exercise per Session: Not on file  Stress:    Feeling of Stress : Not on file  Social Connections:    Frequency of Communication with Friends and Family: Not on file   Frequency of Social Gatherings with Friends and Family: Not on file   Attends Religious Services: Not on file   Active Member of Clubs or Organizations: Not on file   Attends Archivist Meetings: Not on file   Marital Status: Not on file  Intimate Partner Violence:    Fear of Current or Ex-Partner: Not on file   Emotionally Abused: Not on file   Physically Abused: Not on file   Sexually Abused: Not on file    Family History  Problem Relation Age of Onset   Heart attack Mother    COPD Mother    Heart disease Mother    Hyperlipidemia Mother    Heart attack Father    Heart disease Father    Hyperlipidemia Father    Hypertension Other      Review of Systems  Constitutional: Negative.  Negative for chills and fever.  HENT: Negative.  Negative for congestion and sore throat.   Respiratory: Negative.  Negative for cough and shortness of breath.   Cardiovascular: Negative.  Negative for chest pain and palpitations.  Gastrointestinal: Negative.  Negative for abdominal pain, diarrhea, nausea and vomiting.  Genitourinary: Negative.  Negative for dysuria and hematuria.  Musculoskeletal: Negative.   Skin: Negative.  Negative for rash.  Neurological: Negative.  Negative for dizziness and headaches.  All other systems reviewed and are negative.  Today's Vitals   11/30/19 0831  BP: (!) 145/90  Pulse: 75  Resp: 16  Temp: 98.9 F (37.2 C)  TempSrc: Temporal  SpO2: 95%  Weight: (!) 302 lb  (137 kg)  Height: 5\' 7"  (1.702 m)   Body mass index is 47.3 kg/m.   Physical Exam Vitals reviewed.  Constitutional:      Appearance: He is obese.  HENT:     Head: Normocephalic.  Eyes:     Extraocular Movements: Extraocular movements intact.     Pupils: Pupils are equal, round, and reactive to light.  Cardiovascular:     Rate and Rhythm: Normal rate and regular rhythm.  Pulses: Normal pulses.     Heart sounds: Normal heart sounds.  Pulmonary:     Effort: Pulmonary effort is normal.     Breath sounds: Normal breath sounds.  Musculoskeletal:        General: Normal range of motion.     Cervical back: Normal range of motion and neck supple.  Skin:    General: Skin is warm and dry.     Capillary Refill: Capillary refill takes less than 2 seconds.  Neurological:     General: No focal deficit present.     Mental Status: He is alert and oriented to person, place, and time.  Psychiatric:        Mood and Affect: Mood normal.        Behavior: Behavior normal.    Results for orders placed or performed in visit on 11/30/19 (from the past 24 hour(s))  POCT glucose (manual entry)     Status: Abnormal   Collection Time: 11/30/19  9:08 AM  Result Value Ref Range   POC Glucose 127 (A) 70 - 99 mg/dl  POCT glycosylated hemoglobin (Hb A1C)     Status: Abnormal   Collection Time: 11/30/19  9:09 AM  Result Value Ref Range   Hemoglobin A1C 6.7 (A) 4.0 - 5.6 %   HbA1c POC (<> result, manual entry)     HbA1c, POC (prediabetic range)     HbA1c, POC (controlled diabetic range)     A total of 30 minutes was spent with the patient, greater than 50% of which was in counseling/coordination of care regarding diabetes and hypertension, cardiovascular risks associated with these conditions, treatment and management including diet and nutrition and new medication Metformin 500 mg twice a day, review of most recent office visit notes, review of most recent blood work, prognosis and need for  follow-up.   ASSESSMENT & PLAN: Hypertension associated with diabetes ([place]) Elevated blood pressure reading.  Will increase Zestoretic to 20-12.5 mg daily.  Encouraged to monitor blood pressure at home. Uncontrolled diabetes with hemoglobin A1c at 6.7.  Will start metformin 500 mg twice a day.  Diet and nutrition discussed.  Encouraged to lose weight and increase physical activity.  Patient started going to [employer] recently.  Follow-up in 3 months.  Zeek was seen today for hypertension and medication refill.  Diagnoses and all orders for this visit:  Hypertension associated with diabetes ([place])  Type 2 diabetes mellitus with hyperglycemia, without long-term current use of insulin ([facility]) -     POCT glucose (manual entry) -     POCT glycosylated hemoglobin (Hb A1C) -     CBC with Differential/Platelet -     metFORMIN (GLUCOPHAGE) 500 MG tablet; Take 1 tablet (500 mg total) by mouth 2 (two) times daily with a meal.  Morbid obesity ([place])  Essential hypertension -     CBC with Differential/Platelet -     Comprehensive metabolic panel -     Discontinue: lisinopril-hydrochlorothiazide (ZESTORETIC) 10-12.5 MG tablet; Take 1 tablet by mouth daily. -     lisinopril-hydrochlorothiazide (ZESTORETIC) 20-12.5 MG tablet; Take 1 tablet by mouth daily.  High triglycerides -     Lipid panel    Patient Instructions       If you have lab work done today you will be contacted with your lab results within the next 2 weeks.  If you have not heard from [place] then please contact us. The fastest way to get your results is to register for My  Chart.   IF you received an x-ray today, you will receive an invoice from [facility]. Please contact [facility] at [phone] with questions or concerns regarding your invoice.   IF you received labwork today, you will receive an invoice from [facility]. Please contact [facility] at [phone] with questions or concerns regarding  your invoice.   Our billing staff will not be able to assist you with questions regarding bills from these companies.  You will be contacted with the lab results as soon as they are available. The fastest way to get your results is to activate your My Chart account. Instructions are located on the last page of this paperwork. If you have not heard from [place] regarding the results in 2 weeks, please contact this office.     Diabetes Mellitus and Nutrition, Adult When you have diabetes (diabetes mellitus), it is very important to have healthy eating habits because your blood sugar (glucose) levels are greatly affected by what you eat and drink. Eating healthy foods in the appropriate amounts, at about the same times every day, can help you:  Control your blood glucose.  Lower your risk of heart disease.  Improve your blood pressure.  Reach or maintain a healthy weight. Every person with diabetes is different, and each person has different needs for a meal plan. Your health care provider may recommend that you work with a diet and nutrition specialist (dietitian) to make a meal plan that is best for you. Your meal plan may vary depending on factors such as:  The calories you need.  The medicines you take.  Your weight.  Your blood glucose, blood pressure, and cholesterol levels.  Your activity level.  Other health conditions you have, such as heart or kidney disease. How do carbohydrates affect me? Carbohydrates, also called carbs, affect your blood glucose level more than any other type of food. Eating carbs naturally raises the amount of glucose in your blood. Carb counting is a method for keeping track of how many carbs you eat. Counting carbs is important to keep your blood glucose at a healthy level, especially if you use insulin or take certain oral diabetes medicines. It is important to know how many carbs you can safely have in each meal. This is different for every person. Your  dietitian can help you calculate how many carbs you should have at each meal and for each snack. Foods that contain carbs include:  Bread, cereal, rice, pasta, and crackers.  Potatoes and corn.  Peas, beans, and lentils.  Milk and yogurt.  Fruit and juice.  Desserts, such as cakes, cookies, ice cream, and candy. How does alcohol affect me? Alcohol can cause a sudden decrease in blood glucose (hypoglycemia), especially if you use insulin or take certain oral diabetes medicines. Hypoglycemia can be a life-threatening condition. Symptoms of hypoglycemia (sleepiness, dizziness, and confusion) are similar to symptoms of having too much alcohol. If your health care provider says that alcohol is safe for you, follow these guidelines:  Limit alcohol intake to no more than 1 drink per day for nonpregnant women and 2 drinks per day for men. One drink equals 12 oz of beer, 5 oz of wine, or 1 oz of hard liquor.  Do not drink on an empty stomach.  Keep yourself hydrated with water, diet soda, or unsweetened iced tea.  Keep in mind that regular soda, juice, and other mixers may contain a lot of sugar and must be counted as carbs. What are tips  for following this plan?  Reading food labels  Start by checking the serving size on the "Nutrition Facts" label of packaged foods and drinks. The amount of calories, carbs, fats, and other nutrients listed on the label is based on one serving of the item. Many items contain more than one serving per package.  Check the total grams (g) of carbs in one serving. You can calculate the number of servings of carbs in one serving by dividing the total carbs by 15. For example, if a food has 30 g of total carbs, it would be equal to 2 servings of carbs.  Check the number of grams (g) of saturated and trans fats in one serving. Choose foods that have low or no amount of these fats.  Check the number of milligrams (mg) of salt (sodium) in one serving. Most people  should limit total sodium intake to less than 2,300 mg per day.  Always check the nutrition information of foods labeled as "low-fat" or "nonfat". These foods may be higher in added sugar or refined carbs and should be avoided.  Talk to your dietitian to identify your daily goals for nutrients listed on the label. Shopping  Avoid buying canned, premade, or processed foods. These foods tend to be high in fat, sodium, and added sugar.  Shop around the outside edge of the grocery store. This includes fresh fruits and vegetables, bulk grains, fresh meats, and fresh dairy. Cooking  Use low-heat cooking methods, such as baking, instead of high-heat cooking methods like deep frying.  Cook using healthy oils, such as olive, canola, or sunflower oil.  Avoid cooking with butter, cream, or high-fat meats. Meal planning  Eat meals and snacks regularly, preferably at the same times every day. Avoid going long periods of time without eating.  Eat foods high in fiber, such as fresh fruits, vegetables, beans, and whole grains. Talk to your dietitian about how many servings of carbs you can eat at each meal.  Eat 4-6 ounces (oz) of lean protein each day, such as lean meat, chicken, fish, eggs, or tofu. One oz of lean protein is equal to: ? 1 oz of meat, chicken, or fish. ? 1 egg. ?  cup of tofu.  Eat some foods each day that contain healthy fats, such as avocado, nuts, seeds, and fish. Lifestyle  Check your blood glucose regularly.  Exercise regularly as told by your health care provider. This may include: ? 150 minutes of moderate-intensity or vigorous-intensity exercise each week. This could be brisk walking, biking, or water aerobics. ? Stretching and doing strength exercises, such as yoga or weightlifting, at least 2 times a week.  Take medicines as told by your health care provider.  Do not use any products that contain nicotine or tobacco, such as cigarettes and e-cigarettes. If you need  help quitting, ask your health care provider.  Work with a Social worker or diabetes educator to identify strategies to manage stress and any emotional and social challenges. Questions to ask a health care provider  Do I need to meet with a diabetes educator?  Do I need to meet with a dietitian?  What number can I call if I have questions?  When are the best times to check my blood glucose? Where to find more information:  American Diabetes Association: diabetes.org  Academy of Nutrition and Dietetics: www.eatright.[employer] of Diabetes and Digestive and Kidney Diseases (NIH): [employer] Summary  A healthy meal plan will help you control your blood  glucose and maintain a healthy lifestyle.  Working with a diet and nutrition specialist (dietitian) can help you make a meal plan that is best for you.  Keep in mind that carbohydrates (carbs) and alcohol have immediate effects on your blood glucose levels. It is important to count carbs and to use alcohol carefully. This information is not intended to replace advice given to you by your health care provider. Make sure you discuss any questions you have with your health care provider. Document Revised: 09/27/2017 Document Reviewed: 11/19/2016 Elsevier Patient Education  2020 [employer]vier Inc.      Agustina Caroli, MD [facility]

## 2019-11-30 NOTE — Patient Instructions (Addendum)
   If you have lab work done today you will be contacted with your lab results within the next 2 weeks.  If you have not heard from us then please contact us. The fastest way to get your results is to register for My Chart.   IF you received an x-ray today, you will receive an invoice from [facility]. Please contact  Radiology at [phone] with questions or concerns regarding your invoice.   IF you received labwork today, you will receive an invoice from [facility]. Please contact [facility] at [phone] with questions or concerns regarding your invoice.   Our billing staff will not be able to assist you with questions regarding bills from these companies.  You will be contacted with the lab results as soon as they are available. The fastest way to get your results is to activate your My Chart account. Instructions are located on the last page of this paperwork. If you have not heard from us regarding the results in 2 weeks, please contact this office.     Diabetes Mellitus and Nutrition, Adult When you have diabetes (diabetes mellitus), it is very important to have healthy eating habits because your blood sugar (glucose) levels are greatly affected by what you eat and drink. Eating healthy foods in the appropriate amounts, at about the same times every day, can help you:  Control your blood glucose.  Lower your risk of heart disease.  Improve your blood pressure.  Reach or maintain a healthy weight. Every person with diabetes is different, and each person has different needs for a meal plan. Your health care provider may recommend that you work with a diet and nutrition specialist (dietitian) to make a meal plan that is best for you. Your meal plan may vary depending on factors such as:  The calories you need.  The medicines you take.  Your weight.  Your blood glucose, blood pressure, and cholesterol levels.  Your activity level.  Other health conditions  you have, such as heart or kidney disease. How do carbohydrates affect me? Carbohydrates, also called carbs, affect your blood glucose level more than any other type of food. Eating carbs naturally raises the amount of glucose in your blood. Carb counting is a method for keeping track of how many carbs you eat. Counting carbs is important to keep your blood glucose at a healthy level, especially if you use insulin or take certain oral diabetes medicines. It is important to know how many carbs you can safely have in each meal. This is different for every person. Your dietitian can help you calculate how many carbs you should have at each meal and for each snack. Foods that contain carbs include:  Bread, cereal, rice, pasta, and crackers.  Potatoes and corn.  Peas, beans, and lentils.  Milk and yogurt.  Fruit and juice.  Desserts, such as cakes, cookies, ice cream, and candy. How does alcohol affect me? Alcohol can cause a sudden decrease in blood glucose (hypoglycemia), especially if you use insulin or take certain oral diabetes medicines. Hypoglycemia can be a life-threatening condition. Symptoms of hypoglycemia (sleepiness, dizziness, and confusion) are similar to symptoms of having too much alcohol. If your health care provider says that alcohol is safe for you, follow these guidelines:  Limit alcohol intake to no more than 1 drink per day for nonpregnant women and 2 drinks per day for men. One drink equals 12 oz of beer, 5 oz of wine, or 1 oz of hard liquor.    Do not drink on an empty stomach.  Keep yourself hydrated with water, diet soda, or unsweetened iced tea.  Keep in mind that regular soda, juice, and other mixers may contain a lot of sugar and must be counted as carbs. What are tips for following this plan?  Reading food labels  Start by checking the serving size on the "Nutrition Facts" label of packaged foods and drinks. The amount of calories, carbs, fats, and other  nutrients listed on the label is based on one serving of the item. Many items contain more than one serving per package.  Check the total grams (g) of carbs in one serving. You can calculate the number of servings of carbs in one serving by dividing the total carbs by 15. For example, if a food has 30 g of total carbs, it would be equal to 2 servings of carbs.  Check the number of grams (g) of saturated and trans fats in one serving. Choose foods that have low or no amount of these fats.  Check the number of milligrams (mg) of salt (sodium) in one serving. Most people should limit total sodium intake to less than 2,300 mg per day.  Always check the nutrition information of foods labeled as "low-fat" or "nonfat". These foods may be higher in added sugar or refined carbs and should be avoided.  Talk to your dietitian to identify your daily goals for nutrients listed on the label. Shopping  Avoid buying canned, premade, or processed foods. These foods tend to be high in fat, sodium, and added sugar.  Shop around the outside edge of the grocery store. This includes fresh fruits and vegetables, bulk grains, fresh meats, and fresh dairy. Cooking  Use low-heat cooking methods, such as baking, instead of high-heat cooking methods like deep frying.  Cook using healthy oils, such as olive, canola, or sunflower oil.  Avoid cooking with butter, cream, or high-fat meats. Meal planning  Eat meals and snacks regularly, preferably at the same times every day. Avoid going long periods of time without eating.  Eat foods high in fiber, such as fresh fruits, vegetables, beans, and whole grains. Talk to your dietitian about how many servings of carbs you can eat at each meal.  Eat 4-6 ounces (oz) of lean protein each day, such as lean meat, chicken, fish, eggs, or tofu. One oz of lean protein is equal to: ? 1 oz of meat, chicken, or fish. ? 1 egg. ?  cup of tofu.  Eat some foods each day that contain  healthy fats, such as avocado, nuts, seeds, and fish. Lifestyle  Check your blood glucose regularly.  Exercise regularly as told by your health care provider. This may include: ? 150 minutes of moderate-intensity or vigorous-intensity exercise each week. This could be brisk walking, biking, or water aerobics. ? Stretching and doing strength exercises, such as yoga or weightlifting, at least 2 times a week.  Take medicines as told by your health care provider.  Do not use any products that contain nicotine or tobacco, such as cigarettes and e-cigarettes. If you need help quitting, ask your health care provider.  Work with a counselor or diabetes educator to identify strategies to manage stress and any emotional and social challenges. Questions to ask a health care provider  Do I need to meet with a diabetes educator?  Do I need to meet with a dietitian?  What number can I call if I have questions?  When are the best times to   times to check my blood glucose? Where to find more information:  American Diabetes Association: diabetes.org  Academy of Nutrition and Dietetics: www.eatright.org  National Institute of Diabetes and Digestive and Kidney Diseases (NIH): www.niddk.nih.gov Summary  A healthy meal plan will help you control your blood glucose and maintain a healthy lifestyle.  Working with a diet and nutrition specialist (dietitian) can help you make a meal plan that is best for you.  Keep in mind that carbohydrates (carbs) and alcohol have immediate effects on your blood glucose levels. It is important to count carbs and to use alcohol carefully. This information is not intended to replace advice given to you by your health care provider. Make sure you discuss any questions you have with your health care provider. Document Revised: 09/27/2017 Document Reviewed: 11/19/2016 Elsevier Patient Education  2020 Elsevier Inc.  

## 2019-11-30 NOTE — Assessment & Plan Note (Signed)
Elevated blood pressure reading.  Will increase Zestoretic to 20-12.5 mg daily.  Encouraged to monitor blood pressure at home. Uncontrolled diabetes with hemoglobin A1c at 6.7.  Will start metformin 500 mg twice a day.  Diet and nutrition discussed.  Encouraged to lose weight and increase physical activity.  Patient started going to [employer] recently.  Follow-up in 3 months.

## 2019-12-01 ENCOUNTER — Encounter: Payer: Self-pay | Admitting: Radiology

## 2019-12-01 LAB — LIPID PANEL
Chol/HDL Ratio: 3.9 ratio (ref 0.0–5.0)
Cholesterol, Total: 122 mg/dL (ref 100–199)
HDL: 31 mg/dL — ABNORMAL LOW (ref >39)
LDL Chol Calc (NIH): 53 mg/dL (ref 0–99)
Triglycerides: 236 mg/dL — ABNORMAL HIGH (ref 0–149)
VLDL Cholesterol Cal: 38 mg/dL (ref 5–40)

## 2019-12-01 LAB — COMPREHENSIVE METABOLIC PANEL
ALT: 40 IU/L (ref 0–44)
AST: 29 IU/L (ref 0–40)
Albumin/Globulin Ratio: 1.9 (ref 1.2–2.2)
Albumin: 4.6 g/dL (ref 3.8–4.9)
Alkaline Phosphatase: 65 IU/L (ref 39–117)
BUN/Creatinine Ratio: 21 — ABNORMAL HIGH (ref 9–20)
BUN: 20 mg/dL (ref 6–24)
Bilirubin Total: 0.9 mg/dL (ref 0.0–1.2)
CO2: 25 mmol/L (ref 20–29)
Calcium: 9.2 mg/dL (ref 8.7–10.2)
Chloride: 99 mmol/L (ref 96–106)
Creatinine, Ser: 0.97 mg/dL (ref 0.76–1.27)
GFR calc Af Amer: 103 mL/min/{1.73_m2} (ref >59)
GFR calc non Af Amer: 89 mL/min/{1.73_m2} (ref >59)
Globulin, Total: 2.4 g/dL (ref 1.5–4.5)
Glucose: 124 mg/dL — ABNORMAL HIGH (ref 65–99)
Potassium: 4.4 mmol/L (ref 3.5–5.2)
Sodium: 139 mmol/L (ref 134–144)
Total Protein: 7 g/dL (ref 6.0–8.5)

## 2019-12-01 LAB — CBC WITH DIFFERENTIAL/PLATELET
Basophils Absolute: 0 10*3/uL (ref 0.0–0.2)
Basos: 0 %
EOS (ABSOLUTE): 0.1 10*3/uL (ref 0.0–0.4)
Eos: 2 %
Hematocrit: 47.1 % (ref 37.5–51.0)
Hemoglobin: 16.3 g/dL (ref 13.0–17.7)
Immature Grans (Abs): 0 10*3/uL (ref 0.0–0.1)
Immature Granulocytes: 0 %
Lymphocytes Absolute: 1.4 10*3/uL (ref 0.7–3.1)
Lymphs: 28 %
MCH: 32.8 pg (ref 26.6–33.0)
MCHC: 34.6 g/dL (ref 31.5–35.7)
MCV: 95 fL (ref 79–97)
Monocytes Absolute: 0.3 10*3/uL (ref 0.1–0.9)
Monocytes: 6 %
Neutrophils Absolute: 3.3 10*3/uL (ref 1.4–7.0)
Neutrophils: 64 %
Platelets: 189 10*3/uL (ref 150–450)
RBC: 4.97 x10E6/uL (ref 4.14–5.80)
RDW: 12.6 % (ref 11.6–15.4)
WBC: 5.1 10*3/uL (ref 3.4–10.8)

## 2020-02-29 ENCOUNTER — Encounter: Payer: Self-pay | Admitting: Emergency Medicine

## 2020-02-29 ENCOUNTER — Ambulatory Visit: Payer: BC Managed Care – PPO | Admitting: Emergency Medicine

## 2020-02-29 ENCOUNTER — Other Ambulatory Visit: Payer: Self-pay

## 2020-02-29 VITALS — BP 150/87 | HR 77 | Temp 97.8°F | Resp 16 | Ht 67.0 in | Wt 302.0 lb

## 2020-02-29 DIAGNOSIS — I10 Essential (primary) hypertension: Secondary | ICD-10-CM

## 2020-02-29 DIAGNOSIS — E1165 Type 2 diabetes mellitus with hyperglycemia: Secondary | ICD-10-CM | POA: Diagnosis not present

## 2020-02-29 DIAGNOSIS — E1159 Type 2 diabetes mellitus with other circulatory complications: Secondary | ICD-10-CM | POA: Diagnosis not present

## 2020-02-29 DIAGNOSIS — E781 Pure hyperglyceridemia: Secondary | ICD-10-CM

## 2020-02-29 DIAGNOSIS — E785 Hyperlipidemia, unspecified: Secondary | ICD-10-CM

## 2020-02-29 DIAGNOSIS — E1169 Type 2 diabetes mellitus with other specified complication: Secondary | ICD-10-CM | POA: Diagnosis not present

## 2020-02-29 DIAGNOSIS — I152 Hypertension secondary to endocrine disorders: Secondary | ICD-10-CM

## 2020-02-29 MED ORDER — EZETIMIBE 10 MG PO TABS: 10.0000 mg | ORAL_TABLET | Freq: Every day | ORAL | 3 refills | Status: DC

## 2020-02-29 MED ORDER — METFORMIN HCL 500 MG PO TABS: 500.0000 mg | ORAL_TABLET | Freq: Two times a day (BID) | ORAL | 3 refills | Status: DC

## 2020-02-29 MED ORDER — LISINOPRIL-HYDROCHLOROTHIAZIDE 20-12.5 MG PO TABS: 1.0000 | ORAL_TABLET | Freq: Every day | ORAL | 3 refills | Status: DC

## 2020-02-29 NOTE — Patient Instructions (Addendum)
   If you have lab work done today you will be contacted with your lab results within the next 2 weeks.  If you have not heard from us then please contact us. The fastest way to get your results is to register for My Chart.   IF you received an x-ray today, you will receive an invoice from [facility]. Please contact [facility] at [phone] with questions or concerns regarding your invoice.   IF you received labwork today, you will receive an invoice from [facility]. Please contact [facility] at [phone] with questions or concerns regarding your invoice.   Our billing staff will not be able to assist you with questions regarding bills from these companies.  You will be contacted with the lab results as soon as they are available. The fastest way to get your results is to activate your My Chart account. Instructions are located on the last page of this paperwork. If you have not heard from us regarding the results in 2 weeks, please contact this office.     Diabetes Mellitus and Nutrition, Adult When you have diabetes (diabetes mellitus), it is very important to have healthy eating habits because your blood sugar (glucose) levels are greatly affected by what you eat and drink. Eating healthy foods in the appropriate amounts, at about the same times every day, can help you:  Control your blood glucose.  Lower your risk of heart disease.  Improve your blood pressure.  Reach or maintain a healthy weight. Every person with diabetes is different, and each person has different needs for a meal plan. Your health care provider may recommend that you work with a diet and nutrition specialist (dietitian) to make a meal plan that is best for you. Your meal plan may vary depending on factors such as:  The calories you need.  The medicines you take.  Your weight.  Your blood glucose, blood pressure, and cholesterol levels.  Your activity level.  Other health conditions  you have, such as heart or kidney disease. How do carbohydrates affect me? Carbohydrates, also called carbs, affect your blood glucose level more than any other type of food. Eating carbs naturally raises the amount of glucose in your blood. Carb counting is a method for keeping track of how many carbs you eat. Counting carbs is important to keep your blood glucose at a healthy level, especially if you use insulin or take certain oral diabetes medicines. It is important to know how many carbs you can safely have in each meal. This is different for every person. Your dietitian can help you calculate how many carbs you should have at each meal and for each snack. Foods that contain carbs include:  Bread, cereal, rice, pasta, and crackers.  Potatoes and corn.  Peas, beans, and lentils.  Milk and yogurt.  Fruit and juice.  Desserts, such as cakes, cookies, ice cream, and candy. How does alcohol affect me? Alcohol can cause a sudden decrease in blood glucose (hypoglycemia), especially if you use insulin or take certain oral diabetes medicines. Hypoglycemia can be a life-threatening condition. Symptoms of hypoglycemia (sleepiness, dizziness, and confusion) are similar to symptoms of having too much alcohol. If your health care provider says that alcohol is safe for you, follow these guidelines:  Limit alcohol intake to no more than 1 drink per day for nonpregnant women and 2 drinks per day for men. One drink equals 12 oz of beer, 5 oz of wine, or 1 oz of hard liquor.    Do not drink on an empty stomach.  Keep yourself hydrated with water, diet soda, or unsweetened iced tea.  Keep in mind that regular soda, juice, and other mixers may contain a lot of sugar and must be counted as carbs. What are tips for following this plan?  Reading food labels  Start by checking the serving size on the "Nutrition Facts" label of packaged foods and drinks. The amount of calories, carbs, fats, and other  nutrients listed on the label is based on one serving of the item. Many items contain more than one serving per package.  Check the total grams (g) of carbs in one serving. You can calculate the number of servings of carbs in one serving by dividing the total carbs by 15. For example, if a food has 30 g of total carbs, it would be equal to 2 servings of carbs.  Check the number of grams (g) of saturated and trans fats in one serving. Choose foods that have low or no amount of these fats.  Check the number of milligrams (mg) of salt (sodium) in one serving. Most people should limit total sodium intake to less than 2,300 mg per day.  Always check the nutrition information of foods labeled as "low-fat" or "nonfat". These foods may be higher in added sugar or refined carbs and should be avoided.  Talk to your dietitian to identify your daily goals for nutrients listed on the label. Shopping  Avoid buying canned, premade, or processed foods. These foods tend to be high in fat, sodium, and added sugar.  Shop around the outside edge of the grocery store. This includes fresh fruits and vegetables, bulk grains, fresh meats, and fresh dairy. Cooking  Use low-heat cooking methods, such as baking, instead of high-heat cooking methods like deep frying.  Cook using healthy oils, such as olive, canola, or sunflower oil.  Avoid cooking with butter, cream, or high-fat meats. Meal planning  Eat meals and snacks regularly, preferably at the same times every day. Avoid going long periods of time without eating.  Eat foods high in fiber, such as fresh fruits, vegetables, beans, and whole grains. Talk to your dietitian about how many servings of carbs you can eat at each meal.  Eat 4-6 ounces (oz) of lean protein each day, such as lean meat, chicken, fish, eggs, or tofu. One oz of lean protein is equal to: ? 1 oz of meat, chicken, or fish. ? 1 egg. ?  cup of tofu.  Eat some foods each day that contain  healthy fats, such as avocado, nuts, seeds, and fish. Lifestyle  Check your blood glucose regularly.  Exercise regularly as told by your health care provider. This may include: ? 150 minutes of moderate-intensity or vigorous-intensity exercise each week. This could be brisk walking, biking, or water aerobics. ? Stretching and doing strength exercises, such as yoga or weightlifting, at least 2 times a week.  Take medicines as told by your health care provider.  Do not use any products that contain nicotine or tobacco, such as cigarettes and e-cigarettes. If you need help quitting, ask your health care provider.  Work with a counselor or diabetes educator to identify strategies to manage stress and any emotional and social challenges. Questions to ask a health care provider  Do I need to meet with a diabetes educator?  Do I need to meet with a dietitian?  What number can I call if I have questions?  When are the best times to   check my blood glucose? Where to find more information:  American Diabetes Association: diabetes.org  Academy of Nutrition and Dietetics: www.eatright.org  National Institute of Diabetes and Digestive and Kidney Diseases (NIH): www.niddk.nih.gov Summary  A healthy meal plan will help you control your blood glucose and maintain a healthy lifestyle.  Working with a diet and nutrition specialist (dietitian) can help you make a meal plan that is best for you.  Keep in mind that carbohydrates (carbs) and alcohol have immediate effects on your blood glucose levels. It is important to count carbs and to use alcohol carefully. This information is not intended to replace advice given to you by your health care provider. Make sure you discuss any questions you have with your health care provider. Document Revised: 09/27/2017 Document Reviewed: 11/19/2016 Elsevier Patient Education  2020 Elsevier Inc.  Hypertension, Adult High blood pressure (hypertension) is when  the force of blood pumping through the arteries is too strong. The arteries are the blood vessels that carry blood from the heart throughout the body. Hypertension forces the heart to work harder to pump blood and may cause arteries to become narrow or stiff. Untreated or uncontrolled hypertension can cause a heart attack, heart failure, a stroke, kidney disease, and other problems. A blood pressure reading consists of a higher number over a lower number. Ideally, your blood pressure should be below 120/80. The first ("top") number is called the systolic pressure. It is a measure of the pressure in your arteries as your heart beats. The second ("bottom") number is called the diastolic pressure. It is a measure of the pressure in your arteries as the heart relaxes. What are the causes? The exact cause of this condition is not known. There are some conditions that result in or are related to high blood pressure. What increases the risk? Some risk factors for high blood pressure are under your control. The following factors may make you more likely to develop this condition:  Smoking.  Having type 2 diabetes mellitus, high cholesterol, or both.  Not getting enough exercise or physical activity.  Being overweight.  Having too much fat, sugar, calories, or salt (sodium) in your diet.  Drinking too much alcohol. Some risk factors for high blood pressure may be difficult or impossible to change. Some of these factors include:  Having chronic kidney disease.  Having a family history of high blood pressure.  Age. Risk increases with age.  Race. You may be at higher risk if you are African American.  Gender. Men are at higher risk than women before age 45. After age 65, women are at higher risk than men.  Having obstructive sleep apnea.  Stress. What are the signs or symptoms? High blood pressure may not cause symptoms. Very high blood pressure (hypertensive crisis) may  cause:  Headache.  Anxiety.  Shortness of breath.  Nosebleed.  Nausea and vomiting.  Vision changes.  Severe chest pain.  Seizures. How is this diagnosed? This condition is diagnosed by measuring your blood pressure while you are seated, with your arm resting on a flat surface, your legs uncrossed, and your feet flat on the floor. The cuff of the blood pressure monitor will be placed directly against the skin of your upper arm at the level of your heart. It should be measured at least twice using the same arm. Certain conditions can cause a difference in blood pressure between your right and left arms. Certain factors can cause blood pressure readings to be lower or higher   than normal for a short period of time:  When your blood pressure is higher when you are in a health care provider's office than when you are at home, this is called white coat hypertension. Most people with this condition do not need medicines.  When your blood pressure is higher at home than when you are in a health care provider's office, this is called masked hypertension. Most people with this condition may need medicines to control blood pressure. If you have a high blood pressure reading during one visit or you have normal blood pressure with other risk factors, you may be asked to:  Return on a different day to have your blood pressure checked again.  Monitor your blood pressure at home for 1 week or longer. If you are diagnosed with hypertension, you may have other blood or imaging tests to help your health care provider understand your overall risk for other conditions. How is this treated? This condition is treated by making healthy lifestyle changes, such as eating healthy foods, exercising more, and reducing your alcohol intake. Your health care provider may prescribe medicine if lifestyle changes are not enough to get your blood pressure under control, and if:  Your systolic blood pressure is above  130.  Your diastolic blood pressure is above 80. Your personal target blood pressure may vary depending on your medical conditions, your age, and other factors. Follow these instructions at home: Eating and drinking   Eat a diet that is high in fiber and potassium, and low in sodium, added sugar, and fat. An example eating plan is called the DASH (Dietary Approaches to Stop Hypertension) diet. To eat this way: ? Eat plenty of fresh fruits and vegetables. Try to fill one half of your plate at each meal with fruits and vegetables. ? Eat whole grains, such as whole-wheat pasta, brown rice, or whole-grain bread. Fill about one fourth of your plate with whole grains. ? Eat or drink low-fat dairy products, such as skim milk or low-fat yogurt. ? Avoid fatty cuts of meat, processed or cured meats, and poultry with skin. Fill about one fourth of your plate with lean proteins, such as fish, chicken without skin, beans, eggs, or tofu. ? Avoid pre-made and processed foods. These tend to be higher in sodium, added sugar, and fat.  Reduce your daily sodium intake. Most people with hypertension should eat less than 1,500 mg of sodium a day.  Do not drink alcohol if: ? Your health care provider tells you not to drink. ? You are pregnant, may be pregnant, or are planning to become pregnant.  If you drink alcohol: ? Limit how much you use to:  0-1 drink a day for women.  0-2 drinks a day for men. ? Be aware of how much alcohol is in your drink. In the [place] one drink equals one 12 oz bottle of beer (355 mL), one 5 oz glass of wine (148 mL), or one 1 oz glass of hard liquor (44 mL). Lifestyle   Work with your health care provider to maintain a healthy body weight or to lose weight. Ask what an ideal weight is for you.  Get at least 30 minutes of exercise most days of the week. Activities may include walking, swimming, or biking.  Include exercise to strengthen your muscles (resistance exercise),  such as Pilates or lifting weights, as part of your weekly exercise routine. Try to do these types of exercises for 30 minutes at least 3 days a   week.  Do not use any products that contain nicotine or tobacco, such as cigarettes, e-cigarettes, and chewing tobacco. If you need help quitting, ask your health care provider.  Monitor your blood pressure at home as told by your health care provider.  Keep all follow-up visits as told by your health care provider. This is important. Medicines  Take over-the-counter and prescription medicines only as told by your health care provider. Follow directions carefully. Blood pressure medicines must be taken as prescribed.  Do not skip doses of blood pressure medicine. Doing this puts you at risk for problems and can make the medicine less effective.  Ask your health care provider about side effects or reactions to medicines that you should watch for. Contact a health care provider if you:  Think you are having a reaction to a medicine you are taking.  Have headaches that keep coming back (recurring).  Feel dizzy.  Have swelling in your ankles.  Have trouble with your vision. Get help right away if you:  Develop a severe headache or confusion.  Have unusual weakness or numbness.  Feel faint.  Have severe pain in your chest or abdomen.  Vomit repeatedly.  Have trouble breathing. Summary  Hypertension is when the force of blood pumping through your arteries is too strong. If this condition is not controlled, it may put you at risk for serious complications.  Your personal target blood pressure may vary depending on your medical conditions, your age, and other factors. For most people, a normal blood pressure is less than 120/80.  Hypertension is treated with lifestyle changes, medicines, or a combination of both. Lifestyle changes include losing weight, eating a healthy, low-sodium diet, exercising more, and limiting alcohol. This  information is not intended to replace advice given to you by your health care provider. Make sure you discuss any questions you have with your health care provider. Document Revised: 06/25/2018 Document Reviewed: 06/25/2018 Elsevier Patient Education  2020 Elsevier Inc.  

## 2020-02-29 NOTE — Progress Notes (Signed)
Andrew [name]. 54 y.o.   Chief Complaint  Patient presents with  . Diabetes    follow up 3 month  . Hypertension  . Medication Refill    PEND    HISTORY OF PRESENT ILLNESS: This is a 54 y.o. male with history of diabetes and hypertension and high triglycerides here for follow-up and medication refill. 1.  Diabetes: On Metformin 500 mg twice a day Lab Results  Component Value Date   HGBA1C 6.7 (A) 11/30/2019    2.  Hypertension: On Zestoretic 20-12.5 mg daily. BP Readings from Last 3 Encounters:  02/29/20 (!) 150/87  11/30/19 120/82  08/24/19 (!) 149/86    3.  High triglycerides: On Zetia 10 mg daily. Lab Results  Component Value Date   CHOL 122 11/30/2019   HDL 31 (L) 11/30/2019   LDLCALC 53 11/30/2019   TRIG 236 (H) 11/30/2019   CHOLHDL 3.9 11/30/2019   Is no complaints or medical concerns today.  HPI   Prior to Admission medications   Medication Sig Start Date End Date Taking? Authorizing Provider  aspirin 81 MG tablet Take 81 mg by mouth daily.     Yes [provider]  ezetimibe (ZETIA) 10 MG tablet Take 1 tablet (10 mg total) by mouth daily. 02/29/20  Yes Makinze Jani, Ines Bloomer, MD  lisinopril-hydrochlorothiazide (ZESTORETIC) 20-12.5 MG tablet Take 1 tablet by mouth daily. 02/29/20  Yes Horald Pollen, MD  metFORMIN (GLUCOPHAGE) 500 MG tablet Take 1 tablet (500 mg total) by mouth 2 (two) times daily with a meal. 02/29/20  Yes [place], Ines Bloomer, MD    Allergies  Allergen Reactions  . Crestor [Rosuvastatin Calcium] Other (See Comments)    Joint pain  . Testosterone Rash    SEVERE RASH WITH ITCHING (TOPICAL AND INJECTION)    Patient Active Problem List   Diagnosis Date Noted  . High triglycerides 11/30/2019  . Prediabetes 11/28/2018  . OSA on CPAP 05/17/2017  . Morbid obesity ([place]) 05/17/2017  . Hydrocele, right 11/17/2015  . Hypertension associated with diabetes ([place]) 05/13/2012  . Hypogonadism male 05/13/2012    Past  Medical History:  Diagnosis Date  . Epididymal cyst    left  . Family history of premature CAD   . History of exercise intolerance    06-03-2015--  dr Angelena Form--  no ischemia or chest pain and no arrhythmias  . Hyperlipidemia   . Hypertension    cardiologist-  dr Angelena Form  . Left posterior fascicular block   . Obesity, Class III, BMI 40-49.9 (morbid obesity) ([place])   . Right hydrocele   . Wears glasses     Past Surgical History:  Procedure Laterality Date  . CARDIOVASCULAR STRESS TEST  04/23/2011   normal nuclear study w/ no ischemia (no change from previous exam)/  normal LV function and wall motion , ef 64%  . COLONOSCOPY  2012  . HERNIA REPAIR  infant  . HYDROCELE EXCISION Right 09/17/2016   Procedure: HYDROCELECTOMY ADULT;  Surgeon: Kathie Rhodes, MD;  Location: [facility];  Service: Urology;  Laterality: Right;  . WISDOM TOOTH EXTRACTION  2002    Social History   Socioeconomic History  . Marital status: Married    Spouse name: [name]  . Number of children: 1  . Years of education: College  . Highest education level: Not on file  Occupational History  . Occupation: Therapist, occupational: [employer]  Tobacco Use  . Smoking status: Never Smoker  .  Smokeless tobacco: Never Used  Substance and Sexual Activity  . Alcohol use: Yes    Alcohol/week: 2.0 - 3.0 standard drinks    Types: 2 - 3 Standard drinks or equivalent per week    Comment: occasionaly beer (12 pk per month)  . Drug use: No  . Sexual activity: Never  Other Topics Concern  . Not on file  Social History Narrative   [name]) and lives at home with his wife and one child.   Patient is working full-time.   Patient has a college education.   Patient is right-handed.   Patient drinks two cups of coffee daily.   Exercise 2 times a week  On treadmill   Social Determinants of Health   Financial Resource Strain:   . Difficulty of Paying Living Expenses:     Food Insecurity:   . Worried About Charity fundraiser in the Last Year:   . Arboriculturist in the Last Year:   Transportation Needs:   . Film/video editor (Medical):   Marland Kitchen Lack of Transportation (Non-Medical):   Physical Activity:   . Days of Exercise per Week:   . Minutes of Exercise per Session:   Stress:   . Feeling of Stress :   Social Connections:   . Frequency of Communication with Friends and Family:   . Frequency of Social Gatherings with Friends and Family:   . Attends Religious Services:   . Active Member of Clubs or Organizations:   . Attends Archivist Meetings:   Marland Kitchen Marital Status:   Intimate Partner Violence:   . Fear of Current or Ex-Partner:   . Emotionally Abused:   Marland Kitchen Physically Abused:   . Sexually Abused:     Family History  Problem Relation Age of Onset  . Heart attack Mother   . COPD Mother   . Heart disease Mother   . Hyperlipidemia Mother   . Heart attack Father   . Heart disease Father   . Hyperlipidemia Father   . Hypertension Other      Review of Systems  Constitutional: Negative.  Negative for chills and fever.  HENT: Negative.  Negative for congestion and sore throat.   Respiratory: Negative.  Negative for cough and shortness of breath.   Cardiovascular: Negative.  Negative for chest pain and palpitations.  Gastrointestinal: Negative.  Negative for abdominal pain, blood in stool, diarrhea, melena, nausea and vomiting.  Genitourinary: Negative.  Negative for dysuria and hematuria.  Musculoskeletal: Negative.  Negative for back pain and myalgias.  Skin: Negative.   Neurological: Negative.  Negative for dizziness and headaches.  All other systems reviewed and are negative.    Today's Vitals   02/29/20 0831  BP: (!) 150/87  Pulse: 77  Resp: 16  Temp: 97.8 F (36.6 C)  TempSrc: Temporal  SpO2: 95%  Weight: (!) 302 lb (137 kg)  Height: 5\' 7"  (1.702 m)   Body mass index is 47.3 kg/m.   Physical Exam Vitals  reviewed.  Constitutional:      Appearance: Normal appearance. He is obese.  HENT:     Head: Normocephalic.  Eyes:     Extraocular Movements: Extraocular movements intact.     Conjunctiva/sclera: Conjunctivae normal.     Pupils: Pupils are equal, round, and reactive to light.  Cardiovascular:     Rate and Rhythm: Normal rate and regular rhythm.     Pulses: Normal pulses.     Heart sounds: Normal  heart sounds.  Pulmonary:     Effort: Pulmonary effort is normal.     Breath sounds: Normal breath sounds.  Musculoskeletal:        General: Normal range of motion.     Cervical back: Normal range of motion and neck supple.  Skin:    General: Skin is warm and dry.     Capillary Refill: Capillary refill takes less than 2 seconds.  Neurological:     General: No focal deficit present.     Mental Status: He is alert and oriented to person, place, and time.  Psychiatric:        Mood and Affect: Mood normal.        Behavior: Behavior normal.    A total of 30 minutes was spent with the patient, greater than 50% of which was in counseling/coordination of care regarding diabetes and hypertension, management, cardiovascular risks associated with these conditions, review of all medications, review of most recent blood work results, review of most recent office visit notes, diet and nutrition, prognosis, and need for follow-up.   ASSESSMENT & PLAN: Clinically stable.  No medical concerns identified during this visit.  Continue present medications.  No changes.  Follow-up in 6 months.  Dammon was seen today for diabetes, hypertension and medication refill.  Diagnoses and all orders for this visit:  Hypertension associated with diabetes ([place]) -     CBC with Differential/Platelet -     Comprehensive metabolic panel -     Hemoglobin A1c -     lisinopril-hydrochlorothiazide (ZESTORETIC) 20-12.5 MG tablet; Take 1 tablet by mouth daily. -     metFORMIN (GLUCOPHAGE) 500 MG tablet; Take 1 tablet (500  mg total) by mouth 2 (two) times daily with a meal.  Essential hypertension  Type 2 diabetes mellitus with hyperglycemia, without long-term current use of insulin (HCC) -     HM Diabetes Foot Exam  Dyslipidemia associated with type 2 diabetes mellitus (HCC) -     Lipid panel  High triglycerides -     ezetimibe (ZETIA) 10 MG tablet; Take 1 tablet (10 mg total) by mouth daily.    Patient Instructions       If you have lab work done today you will be contacted with your lab results within the next 2 weeks.  If you have not heard from [place] then please contact us. The fastest way to get your results is to register for My Chart.   IF you received an x-ray today, you will receive an invoice from [facility]. Please contact [facility] at [phone] with questions or concerns regarding your invoice.   IF you received labwork today, you will receive an invoice from [facility]. Please contact [facility] at [phone] with questions or concerns regarding your invoice.   Our billing staff will not be able to assist you with questions regarding bills from these companies.  You will be contacted with the lab results as soon as they are available. The fastest way to get your results is to activate your My Chart account. Instructions are located on the last page of this paperwork. If you have not heard from [place] regarding the results in 2 weeks, please contact this office.     Diabetes Mellitus and Nutrition, Adult When you have diabetes (diabetes mellitus), it is very important to have healthy eating habits because your blood sugar (glucose) levels are greatly affected by what you eat and drink. Eating healthy foods in the appropriate amounts, at about the  same times every day, can help you:  Control your blood glucose.  Lower your risk of heart disease.  Improve your blood pressure.  Reach or maintain a healthy weight. Every person with diabetes is different, and each  person has different needs for a meal plan. Your health care provider may recommend that you work with a diet and nutrition specialist (dietitian) to make a meal plan that is best for you. Your meal plan may vary depending on factors such as:  The calories you need.  The medicines you take.  Your weight.  Your blood glucose, blood pressure, and cholesterol levels.  Your activity level.  Other health conditions you have, such as heart or kidney disease. How do carbohydrates affect me? Carbohydrates, also called carbs, affect your blood glucose level more than any other type of food. Eating carbs naturally raises the amount of glucose in your blood. Carb counting is a method for keeping track of how many carbs you eat. Counting carbs is important to keep your blood glucose at a healthy level, especially if you use insulin or take certain oral diabetes medicines. It is important to know how many carbs you can safely have in each meal. This is different for every person. Your dietitian can help you calculate how many carbs you should have at each meal and for each snack. Foods that contain carbs include:  Bread, cereal, rice, pasta, and crackers.  Potatoes and corn.  Peas, beans, and lentils.  Milk and yogurt.  Fruit and juice.  Desserts, such as cakes, cookies, ice cream, and candy. How does alcohol affect me? Alcohol can cause a sudden decrease in blood glucose (hypoglycemia), especially if you use insulin or take certain oral diabetes medicines. Hypoglycemia can be a life-threatening condition. Symptoms of hypoglycemia (sleepiness, dizziness, and confusion) are similar to symptoms of having too much alcohol. If your health care provider says that alcohol is safe for you, follow these guidelines:  Limit alcohol intake to no more than 1 drink per day for nonpregnant women and 2 drinks per day for men. One drink equals 12 oz of beer, 5 oz of wine, or 1 oz of hard liquor.  Do not drink  on an empty stomach.  Keep yourself hydrated with water, diet soda, or unsweetened iced tea.  Keep in mind that regular soda, juice, and other mixers may contain a lot of sugar and must be counted as carbs. What are tips for following this plan?  Reading food labels  Start by checking the serving size on the "Nutrition Facts" label of packaged foods and drinks. The amount of calories, carbs, fats, and other nutrients listed on the label is based on one serving of the item. Many items contain more than one serving per package.  Check the total grams (g) of carbs in one serving. You can calculate the number of servings of carbs in one serving by dividing the total carbs by 15. For example, if a food has 30 g of total carbs, it would be equal to 2 servings of carbs.  Check the number of grams (g) of saturated and trans fats in one serving. Choose foods that have low or no amount of these fats.  Check the number of milligrams (mg) of salt (sodium) in one serving. Most people should limit total sodium intake to less than 2,300 mg per day.  Always check the nutrition information of foods labeled as "low-fat" or "nonfat". These foods may be higher in added sugar or  refined carbs and should be avoided.  Talk to your dietitian to identify your daily goals for nutrients listed on the label. Shopping  Avoid buying canned, premade, or processed foods. These foods tend to be high in fat, sodium, and added sugar.  Shop around the outside edge of the grocery store. This includes fresh fruits and vegetables, bulk grains, fresh meats, and fresh dairy. Cooking  Use low-heat cooking methods, such as baking, instead of high-heat cooking methods like deep frying.  Cook using healthy oils, such as olive, canola, or sunflower oil.  Avoid cooking with butter, cream, or high-fat meats. Meal planning  Eat meals and snacks regularly, preferably at the same times every day. Avoid going long periods of time  without eating.  Eat foods high in fiber, such as fresh fruits, vegetables, beans, and whole grains. Talk to your dietitian about how many servings of carbs you can eat at each meal.  Eat 4-6 ounces (oz) of lean protein each day, such as lean meat, chicken, fish, eggs, or tofu. One oz of lean protein is equal to: ? 1 oz of meat, chicken, or fish. ? 1 egg. ?  cup of tofu.  Eat some foods each day that contain healthy fats, such as avocado, nuts, seeds, and fish. Lifestyle  Check your blood glucose regularly.  Exercise regularly as told by your health care provider. This may include: ? 150 minutes of moderate-intensity or vigorous-intensity exercise each week. This could be brisk walking, biking, or water aerobics. ? Stretching and doing strength exercises, such as yoga or weightlifting, at least 2 times a week.  Take medicines as told by your health care provider.  Do not use any products that contain nicotine or tobacco, such as cigarettes and e-cigarettes. If you need help quitting, ask your health care provider.  Work with a Social worker or diabetes educator to identify strategies to manage stress and any emotional and social challenges. Questions to ask a health care provider  Do I need to meet with a diabetes educator?  Do I need to meet with a dietitian?  What number can I call if I have questions?  When are the best times to check my blood glucose? Where to find more information:  American Diabetes Association: diabetes.org  Academy of Nutrition and Dietetics: www.eatright.[employer] of Diabetes and Digestive and Kidney Diseases (NIH): [employer] Summary  A healthy meal plan will help you control your blood glucose and maintain a healthy lifestyle.  Working with a diet and nutrition specialist (dietitian) can help you make a meal plan that is best for you.  Keep in mind that carbohydrates (carbs) and alcohol have immediate effects on your blood  glucose levels. It is important to count carbs and to use alcohol carefully. This information is not intended to replace advice given to you by your health care provider. Make sure you discuss any questions you have with your health care provider. Document Revised: 09/27/2017 Document Reviewed: 11/19/2016 Elsevier Patient Education  [place].  Hypertension, Adult High blood pressure (hypertension) is when the force of blood pumping through the arteries is too strong. The arteries are the blood vessels that carry blood from the heart throughout the body. Hypertension forces the heart to work harder to pump blood and may cause arteries to become narrow or stiff. Untreated or uncontrolled hypertension can cause a heart attack, heart failure, a stroke, kidney disease, and other problems. A blood pressure reading consists of a higher number over a  lower number. Ideally, your blood pressure should be below 120/80. The first ("top") number is called the systolic pressure. It is a measure of the pressure in your arteries as your heart beats. The second ("bottom") number is called the diastolic pressure. It is a measure of the pressure in your arteries as the heart relaxes. What are the causes? The exact cause of this condition is not known. There are some conditions that result in or are related to high blood pressure. What increases the risk? Some risk factors for high blood pressure are under your control. The following factors may make you more likely to develop this condition:  Smoking.  Having type 2 diabetes mellitus, high cholesterol, or both.  Not getting enough exercise or physical activity.  Being overweight.  Having too much fat, sugar, calories, or salt (sodium) in your diet.  Drinking too much alcohol. Some risk factors for high blood pressure may be difficult or impossible to change. Some of these factors include:  Having chronic kidney disease.  Having a family history of  high blood pressure.  Age. Risk increases with age.  Race. You may be at higher risk if you are African American.  Gender. Men are at higher risk than women before age 11. After age 54, women are at higher risk than men.  Having obstructive sleep apnea.  Stress. What are the signs or symptoms? High blood pressure may not cause symptoms. Very high blood pressure (hypertensive crisis) may cause:  Headache.  Anxiety.  Shortness of breath.  Nosebleed.  Nausea and vomiting.  Vision changes.  Severe chest pain.  Seizures. How is this diagnosed? This condition is diagnosed by measuring your blood pressure while you are seated, with your arm resting on a flat surface, your legs uncrossed, and your feet flat on the floor. The cuff of the blood pressure monitor will be placed directly against the skin of your upper arm at the level of your heart. It should be measured at least twice using the same arm. Certain conditions can cause a difference in blood pressure between your right and left arms. Certain factors can cause blood pressure readings to be lower or higher than normal for a short period of time:  When your blood pressure is higher when you are in a health care provider's office than when you are at home, this is called white coat hypertension. Most people with this condition do not need medicines.  When your blood pressure is higher at home than when you are in a health care provider's office, this is called masked hypertension. Most people with this condition may need medicines to control blood pressure. If you have a high blood pressure reading during one visit or you have normal blood pressure with other risk factors, you may be asked to:  Return on a different day to have your blood pressure checked again.  Monitor your blood pressure at home for 1 week or longer. If you are diagnosed with hypertension, you may have other blood or imaging tests to help your health care  provider understand your overall risk for other conditions. How is this treated? This condition is treated by making healthy lifestyle changes, such as eating healthy foods, exercising more, and reducing your alcohol intake. Your health care provider may prescribe medicine if lifestyle changes are not enough to get your blood pressure under control, and if:  Your systolic blood pressure is above 130.  Your diastolic blood pressure is above 80. Your personal target  blood pressure may vary depending on your medical conditions, your age, and other factors. Follow these instructions at home: Eating and drinking   Eat a diet that is high in fiber and potassium, and low in sodium, added sugar, and fat. An example eating plan is called the DASH (Dietary Approaches to Stop Hypertension) diet. To eat this way: ? Eat plenty of fresh fruits and vegetables. Try to fill one half of your plate at each meal with fruits and vegetables. ? Eat whole grains, such as whole-wheat pasta, brown rice, or whole-grain bread. Fill about one fourth of your plate with whole grains. ? Eat or drink low-fat dairy products, such as skim milk or low-fat yogurt. ? Avoid fatty cuts of meat, processed or cured meats, and poultry with skin. Fill about one fourth of your plate with lean proteins, such as fish, chicken without skin, beans, eggs, or tofu. ? Avoid pre-made and processed foods. These tend to be higher in sodium, added sugar, and fat.  Reduce your daily sodium intake. Most people with hypertension should eat less than 1,500 mg of sodium a day.  Do not drink alcohol if: ? Your health care provider tells you not to drink. ? You are pregnant, may be pregnant, or are planning to become pregnant.  If you drink alcohol: ? Limit how much you use to:  0-1 drink a day for women.  0-2 drinks a day for men. ? Be aware of how much alcohol is in your drink. In the [place] one drink equals one 12 oz bottle of beer (355 mL), one  5 oz glass of wine (148 mL), or one 1 oz glass of hard liquor (44 mL). Lifestyle   Work with your health care provider to maintain a healthy body weight or to lose weight. Ask what an ideal weight is for you.  Get at least 30 minutes of exercise most days of the week. Activities may include walking, swimming, or biking.  Include exercise to strengthen your muscles (resistance exercise), such as Pilates or lifting weights, as part of your weekly exercise routine. Try to do these types of exercises for 30 minutes at least 3 days a week.  Do not use any products that contain nicotine or tobacco, such as cigarettes, e-cigarettes, and chewing tobacco. If you need help quitting, ask your health care provider.  Monitor your blood pressure at home as told by your health care provider.  Keep all follow-up visits as told by your health care provider. This is important. Medicines  Take over-the-counter and prescription medicines only as told by your health care provider. Follow directions carefully. Blood pressure medicines must be taken as prescribed.  Do not skip doses of blood pressure medicine. Doing this puts you at risk for problems and can make the medicine less effective.  Ask your health care provider about side effects or reactions to medicines that you should watch for. Contact a health care provider if you:  Think you are having a reaction to a medicine you are taking.  Have headaches that keep coming back (recurring).  Feel dizzy.  Have swelling in your ankles.  Have trouble with your vision. Get help right away if you:  Develop a severe headache or confusion.  Have unusual weakness or numbness.  Feel faint.  Have severe pain in your chest or abdomen.  Vomit repeatedly.  Have trouble breathing. Summary  Hypertension is when the force of blood pumping through your arteries is too strong. If this condition  is not controlled, it may put you at risk for serious  complications.  Your personal target blood pressure may vary depending on your medical conditions, your age, and other factors. For most people, a normal blood pressure is less than 120/80.  Hypertension is treated with lifestyle changes, medicines, or a combination of both. Lifestyle changes include losing weight, eating a healthy, low-sodium diet, exercising more, and limiting alcohol. This information is not intended to replace advice given to you by your health care provider. Make sure you discuss any questions you have with your health care provider. Document Revised: 06/25/2018 Document Reviewed: 06/25/2018 Elsevier Patient Education  2020 Elsevier Inc.    Agustina Caroli, MD [facility]

## 2020-03-01 ENCOUNTER — Telehealth: Payer: Self-pay | Admitting: Emergency Medicine

## 2020-03-01 LAB — CBC WITH DIFFERENTIAL/PLATELET
Basophils Absolute: 0 10*3/uL (ref 0.0–0.2)
Basos: 1 %
EOS (ABSOLUTE): 0.1 10*3/uL (ref 0.0–0.4)
Eos: 2 %
Hematocrit: 46.6 % (ref 37.5–51.0)
Hemoglobin: 15.8 g/dL (ref 13.0–17.7)
Immature Grans (Abs): 0 10*3/uL (ref 0.0–0.1)
Immature Granulocytes: 0 %
Lymphocytes Absolute: 1.3 10*3/uL (ref 0.7–3.1)
Lymphs: 23 %
MCH: 32.5 pg (ref 26.6–33.0)
MCHC: 33.9 g/dL (ref 31.5–35.7)
MCV: 96 fL (ref 79–97)
Monocytes Absolute: 0.3 10*3/uL (ref 0.1–0.9)
Monocytes: 5 %
Neutrophils Absolute: 3.9 10*3/uL (ref 1.4–7.0)
Neutrophils: 69 %
Platelets: 185 10*3/uL (ref 150–450)
RBC: 4.86 x10E6/uL (ref 4.14–5.80)
RDW: 12.9 % (ref 11.6–15.4)
WBC: 5.7 10*3/uL (ref 3.4–10.8)

## 2020-03-01 LAB — COMPREHENSIVE METABOLIC PANEL
ALT: 41 IU/L (ref 0–44)
AST: 26 IU/L (ref 0–40)
Albumin/Globulin Ratio: 2 (ref 1.2–2.2)
Albumin: 4.6 g/dL (ref 3.8–4.9)
Alkaline Phosphatase: 60 IU/L (ref 39–117)
BUN/Creatinine Ratio: 21 — ABNORMAL HIGH (ref 9–20)
BUN: 20 mg/dL (ref 6–24)
Bilirubin Total: 0.7 mg/dL (ref 0.0–1.2)
CO2: 24 mmol/L (ref 20–29)
Calcium: 9.4 mg/dL (ref 8.7–10.2)
Chloride: 101 mmol/L (ref 96–106)
Creatinine, Ser: 0.96 mg/dL (ref 0.76–1.27)
GFR calc Af Amer: 104 mL/min/{1.73_m2} (ref >59)
GFR calc non Af Amer: 90 mL/min/{1.73_m2} (ref >59)
Globulin, Total: 2.3 g/dL (ref 1.5–4.5)
Glucose: 130 mg/dL — ABNORMAL HIGH (ref 65–99)
Potassium: 4.7 mmol/L (ref 3.5–5.2)
Sodium: 140 mmol/L (ref 134–144)
Total Protein: 6.9 g/dL (ref 6.0–8.5)

## 2020-03-01 LAB — LIPID PANEL
Chol/HDL Ratio: 4.1 ratio (ref 0.0–5.0)
Cholesterol, Total: 128 mg/dL (ref 100–199)
HDL: 31 mg/dL — ABNORMAL LOW (ref >39)
LDL Chol Calc (NIH): 56 mg/dL (ref 0–99)
Triglycerides: 256 mg/dL — ABNORMAL HIGH (ref 0–149)
VLDL Cholesterol Cal: 41 mg/dL — ABNORMAL HIGH (ref 5–40)

## 2020-03-01 LAB — HEMOGLOBIN A1C
Est. average glucose Bld gHb Est-mCnc: 140 mg/dL
Hgb A1c MFr Bld: 6.5 % — ABNORMAL HIGH (ref 4.8–5.6)

## 2020-03-01 NOTE — Progress Notes (Signed)
Pt is aware of labs, will follow up at scheduled appt

## 2020-03-01 NOTE — Telephone Encounter (Signed)
Pt is retuning the office call regarding his lab results. [phone] Please advise.

## 2020-06-20 ENCOUNTER — Other Ambulatory Visit: Payer: Self-pay | Admitting: Emergency Medicine

## 2020-06-20 DIAGNOSIS — E781 Pure hyperglyceridemia: Secondary | ICD-10-CM

## 2020-06-20 NOTE — Telephone Encounter (Signed)
Requested Prescriptions  Pending Prescriptions Disp Refills   ezetimibe (ZETIA) 10 MG tablet [Pharmacy Med Name: EZETIMIBE 10MG  TABLETS] 90 tablet 2    Sig: TAKE 1 TABLET BY MOUTH EVERY DAY     Cardiovascular:  Antilipid - Sterol Transport Inhibitors Failed - 06/20/2020 10:57 AM      Failed - LDL in normal range and within 360 days    LDL Chol Calc (NIH)  Date Value Ref Range Status  02/29/2020 56 0 - 99 mg/dL Final         Failed - HDL in normal range and within 360 days    HDL  Date Value Ref Range Status  02/29/2020 31 (L) >39 mg/dL Final         Failed - Triglycerides in normal range and within 360 days    Triglycerides  Date Value Ref Range Status  02/29/2020 256 (H) 0 - 149 mg/dL Final         Passed - Total Cholesterol in normal range and within 360 days    Cholesterol, Total  Date Value Ref Range Status  02/29/2020 128 100 - 199 mg/dL Final         Passed - Valid encounter within last 12 months    Recent Outpatient Visits          3 months ago Hypertension associated with diabetes [facility])   Primary Care at [place], [place], MD   6 months ago Hypertension associated with diabetes [facility])   Primary Care at [place], Ines Bloomer, MD   1 year ago Routine general medical examination at a health care facility   Primary Care at [place], Ines Bloomer, MD   1 year ago Essential hypertension   Primary Care at [place], Ines Bloomer, MD   2 years ago Routine general medical examination at a health care facility   Primary Care at [facility], Ines Bloomer, MD      Future Appointments            In 2 months [place], Ines Bloomer, MD Primary Care at [place], [place]   In 2 months Angelena Form, Annita Brod, MD [place]           Spoke with patient. States he picked up one 90 day from [facility] but now insurance wants him to use [employer].  #90 2 refills sent to [facility]

## 2020-08-17 ENCOUNTER — Other Ambulatory Visit: Payer: Self-pay

## 2020-08-17 ENCOUNTER — Ambulatory Visit (INDEPENDENT_AMBULATORY_CARE_PROVIDER_SITE_OTHER)
Admission: RE | Admit: 2020-08-17 | Discharge: 2020-08-17 | Disposition: A | Discharge status: Home / Self Care | Payer: BC Managed Care – PPO | Source: Ambulatory Visit | Attending: Cardiovascular Disease | Admitting: Cardiovascular Disease

## 2020-08-17 DIAGNOSIS — R911 Solitary pulmonary nodule: Secondary | ICD-10-CM | POA: Diagnosis not present

## 2020-08-22 ENCOUNTER — Other Ambulatory Visit: Payer: Self-pay

## 2020-08-22 ENCOUNTER — Encounter: Payer: Self-pay | Admitting: Emergency Medicine

## 2020-08-22 ENCOUNTER — Ambulatory Visit: Payer: BC Managed Care – PPO | Admitting: Emergency Medicine

## 2020-08-22 VITALS — BP 132/70 | HR 70 | Temp 99.0°F | Ht 67.0 in | Wt 305.4 lb

## 2020-08-22 DIAGNOSIS — I152 Hypertension secondary to endocrine disorders: Secondary | ICD-10-CM

## 2020-08-22 DIAGNOSIS — Z789 Other specified health status: Secondary | ICD-10-CM

## 2020-08-22 DIAGNOSIS — E1169 Type 2 diabetes mellitus with other specified complication: Secondary | ICD-10-CM

## 2020-08-22 DIAGNOSIS — E785 Hyperlipidemia, unspecified: Secondary | ICD-10-CM

## 2020-08-22 DIAGNOSIS — E1165 Type 2 diabetes mellitus with hyperglycemia: Secondary | ICD-10-CM | POA: Diagnosis not present

## 2020-08-22 DIAGNOSIS — E1159 Type 2 diabetes mellitus with other circulatory complications: Secondary | ICD-10-CM | POA: Diagnosis not present

## 2020-08-22 DIAGNOSIS — E781 Pure hyperglyceridemia: Secondary | ICD-10-CM | POA: Diagnosis not present

## 2020-08-22 DIAGNOSIS — Z6841 Body Mass Index (BMI) 40.0 and over, adult: Secondary | ICD-10-CM

## 2020-08-22 LAB — GLUCOSE, POCT (MANUAL RESULT ENTRY): POC Glucose: 136 mg/dl — AB (ref 70–99)

## 2020-08-22 MED ORDER — EZETIMIBE 10 MG PO TABS: 10.0000 mg | ORAL_TABLET | Freq: Every day | ORAL | 3 refills | Status: DC

## 2020-08-22 NOTE — Progress Notes (Signed)
PATIENT: Andrew [name]. DOB: Oct 28, 1966  REASON FOR VISIT: follow up HISTORY FROM: patient  Chief Complaint  Patient presents with  . Follow-up    rm 1  . Sleep Apnea     HISTORY OF PRESENT ILLNESS: Today 08/23/20 Andrew [name]. is a 54 y.o. male here today for follow up for OSA on CPAP.  He reports that he is doing very well with CPAP therapy.  He is using CPAP nightly.  He denies any concerns with CPAP or supplies.  He is currently using a nasal mask.  He does have facial hair.  He has not noted an excess leak at home.  He is followed closely by primary care.  He had lab work performed yesterday.  A1c 6.9.  Triglycerides 224, HDL 29.  Compliance report dated 07/23/2020 through 08/21/2020 reveals that he used CPAP 30 of the past 30 days for compliance of 100%.  The CPAP greater than 4 hours all 30 days.  Average usage was 7 hours and 50 minutes.  Residual AHI 0.6 on 5 to 20 cm of water and EPR of 3.  There is a leak in the 95th percentile of 32.2 L/min. Per Dr Dohmeier's last note, she suggested increasing min pressure to 7. It does not appear this was ordered.    HISTORY: (copied from Dr Dohmeier's note on 08/24/2019)  HPI:  Andrew [name]. is a 54 y.o. male and is seen here 08-24-2019, in a yearly RV for CPAP compliance. He had questions about the [place]. He also has purchased a new mask online recently.   [name] has been 100% compliant CPAP user his data includes 31 July 2019 at the 30 days prior.  His average use at time of 7 hours 37 minutes.  He is using an air sense 10 AutoSet with 5 to 20 cm pressure window and an EPR level of 3 the 95th percentile pressure is 11 cmH2O which allows [place] to reduce his maximum pressure setting to 15.  He has a residual AHI of 0.6/h which indicates excellent control of his apnea.  There are no central apneas emerging no Cheyne-Stokes respirations are noted, he does have moderate air leakage, his AHI again is  excellent.  I suggested we are setting the minimum pressure to 7 cm water and the maximum pressure to 15 with a 2 cm EPR.   Rv. 08-11-2018, he asks for a prescription to get supplies through the internet. This is a revisit for Mr. Andrew [name]. [name], [name] by 54 year old Caucasian right-handed male with a long-standing history of CPAP use in the treatment of obstructive sleep apnea.  He was originally referred to me in 2015 after having followed Dr. Everlene Farrier. He has remained 100% compliant CPAP user with an average time of use of 7 hours 32 minutes at night, his AutoSet is set between 5 and 20 cmH2O with 3 cm EPR, his 95th percentile pressure is 10.9 cmH2O, his residual AHI is only 0.7/h.  There are no central apneas arising and there are no major air leaks.  He needs new supplies.  He has had some bad experience with his current durable medical equipment company which apparently communicated rather poorly with him.   HPI:  03-02-2014 -Dr. Everlene Farrier has followed this patient for many years. He had an episode of chest pain and was evaluated by cardiology. Given his family history of  cardiopulmonary disease , he was also asked to undergo a sleep  study. However, due to financial reasons he was not able to do it at the time it was originally ordered. He was concerned about the costs. Dr Everlene Farrier was testing him for fatigue and sleepiness, and found to have hypo-testosteroemia. He could not tolerate the testosterone supplements due to severe itching.    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 He aims for 10 PM and rises most morning at 6.30 , spontaneously , with a back up alarm. He is on call frequently, on average 7-8 hours of sleep.  No nocturia, no morning headaches, but a dry mouth  Loud snoring reported when on his back.    His bedroom is cool, quiet, dark.He shares the bed room with his wife.  I had the pleasure of seeing Mr. Andrew [name] over 3 years ago for the same symptoms and reasons that I see him today. At the time he ordered a sleep study but his insurance was not willing to pay for it. He has tried to postpone further evaluation but now feels that he has to get it done. He was seen on 08/17/2016 last by his primary care provider who refilled his antihypertensive medications. He encourage the patient to think about bariatric surgery and the patient has already had an appointment set up. The patient continues to work in a physical job and joined the [employer] for [employer] and cardio training with a final goal of weight loss.  He continues to present with a past medical history of allergies with respiratory impact, gastro-esophageal reflux disease, hyperlipidemia, hypertension on medication and obesity. He is not diabetic father and mother had heart disease and his mother had COPD and post parents also suffered from hyperlipidemia.His sister and mother had OSA and were on CPAP.   2nd October 2018. I had the pleasure of following up with Mr. Andrew [name] on his recent sleep studies. He was diagnosed with obstructive sleep apnea by home sleep test on 04/10/2017, medical history was reviewed, Epworth sleepiness score was endorsed at 14 points. The patient's AHI was 35.1 SPO2 nadir was 74%, total desaturation time at or below 88% was 52 minutes. No tachybradycardia arrhythmia noted. I recommended a CPAP titration was baseline and capnography which was declined by his insurance. We therefore used an auto titrate CPAP.  A compliance download from outdoor titration CPAP was obtained, the AutoSet was between 5 and 20 cm water with 3 cm EPR, total user time on average 7 hours and 6 minutes, 100% compliance for days, 97% compliance bedtime. There was only one day with the patient still short of a four-hour mark. Residual AHI  is 0.3. The patient's 95th percentile pressure was 12.6/ hr.  he does have some higher air leaks using a nasal pillow. He uses now a nasal mask , large mask. He has now plans for bariatric surgery- and the auto-titrator will come in handy. He feels better , more energized, sleeps more soundly and his wife is happy that snoring stopped. No more nocturia.   Mr. Tomey has a family history of sudden cardiac death affecting his father at age 12. He has followed up with a cardiologist yearly.   for the first time the patient endorses a very high degree of sleepiness, he did not feel the same way 3 years ago when I  last saw him.   I think that a sleep study is urgently needed also in preparation of bariatric surgery. He will have an appointment with his bariatric surgeon June 1. It would be lovely if he could do a split-night polysomnography before.   he is restless and his wife reports PLMs.  This cannot be appreciated in a HST>   Soc: ETOH 12 drinks a month, no tobacco use, caffeine - a lot, Coffee 4-6 a day. 3 diet cokes a week.  No pets , daughter is 31 and lives at home.     REVIEW OF SYSTEMS: Out of a complete 14 system review of symptoms, the patient complains only of the following symptoms, none and all other reviewed systems are negative.  ESS: 3 FSS: 12  ALLERGIES: Allergies  Allergen Reactions  . Crestor [Rosuvastatin Calcium] Other (See Comments)    Joint pain  . Testosterone Rash    SEVERE RASH WITH ITCHING (TOPICAL AND INJECTION)    HOME MEDICATIONS: Outpatient Medications Prior to Visit  Medication Sig Dispense Refill  . aspirin 81 MG tablet Take 81 mg by mouth daily.      [name] (ZETIA) 10 MG tablet Take 1 tablet (10 mg total) by mouth daily. 90 tablet 3  . lisinopril-hydrochlorothiazide (ZESTORETIC) 20-12.5 MG tablet Take 1 tablet by mouth daily. 90 tablet 3  . metFORMIN (GLUCOPHAGE) 500 MG tablet Take 1 tablet (500 mg total) by mouth 2 (two) times daily  with a meal. 180 tablet 3   No facility-administered medications prior to visit.    PAST MEDICAL HISTORY: Past Medical History:  Diagnosis Date  . Epididymal cyst    left  . Family history of premature CAD   . History of exercise intolerance    06-03-2015--  dr Angelena Form--  no ischemia or chest pain and no arrhythmias  . Hyperlipidemia   . Hypertension    cardiologist-  dr Angelena Form  . Left posterior fascicular block   . Obesity, Class III, BMI 40-49.9 (morbid obesity) ([place])   . Right hydrocele   . Wears glasses     PAST SURGICAL HISTORY: Past Surgical History:  Procedure Laterality Date  . CARDIOVASCULAR STRESS TEST  04/23/2011   normal nuclear study w/ no ischemia (no change from previous exam)/  normal LV function and wall motion , ef 64%  . COLONOSCOPY  2012  . HERNIA REPAIR  infant  . HYDROCELE EXCISION Right 09/17/2016   Procedure: HYDROCELECTOMY ADULT;  Surgeon: Kathie Rhodes, MD;  Location[facility];  Service: Urology;  Laterality: Right;  . WISDOM TOOTH EXTRACTION  2002    FAMILY HISTORY: Family History  Problem Relation Age of Onset  . Heart attack Mother   . COPD Mother   . Heart disease Mother   . Hyperlipidemia Mother   . Heart attack Father   . Heart disease Father   . Hyperlipidemia Father   . Hypertension Other     SOCIAL HISTORY: Social History   Socioeconomic History  . Marital status: Married    Spouse name: [name]  . Number of children: 1  . Years of education: College  . Highest education level: Not on file  Occupational History  . Occupation: Therapist, occupational: [employer]  Tobacco Use  . Smoking status: Never Smoker  . Smokeless tobacco: Never Used  Substance and Sexual Activity  . Alcohol use: Yes    Alcohol/week: 2.0 - 3.0 standard drinks    Types:  2 - 3 Standard drinks or equivalent per week    Comment: occasionaly beer (12 pk per month)  . Drug use: No  . Sexual activity: Never  Other Topics  Concern  . Not on file  Social History Narrative   [name]) and lives at home with his wife and one child.   Patient is working full-time.   Patient has a college education.   Patient is right-handed.   Patient drinks two cups of coffee daily.   Exercise 2 times a week  On treadmill   Social Determinants of Health   Financial Resource Strain:   . Difficulty of Paying Living Expenses: Not on file  Food Insecurity:   . Worried About Charity fundraiser in the Last Year: Not on file  . Ran Out of Food in the Last Year: Not on file  Transportation Needs:   . Lack of Transportation (Medical): Not on file  . Lack of Transportation (Non-Medical): Not on file  Physical Activity:   . Days of Exercise per Week: Not on file  . Minutes of Exercise per Session: Not on file  Stress:   . Feeling of Stress : Not on file  Social Connections:   . Frequency of Communication with Friends and Family: Not on file  . Frequency of Social Gatherings with Friends and Family: Not on file  . Attends Religious Services: Not on file  . Active Member of Clubs or Organizations: Not on file  . Attends Archivist Meetings: Not on file  . Marital Status: Not on file  Intimate Partner Violence:   . Fear of Current or Ex-Partner: Not on file  . Emotionally Abused: Not on file  . Physically Abused: Not on file  . Sexually Abused: Not on file     PHYSICAL EXAM  Vitals:   08/23/20 0859  BP: 140/77  Pulse: 67  Weight: (!) 311 lb (141.1 kg)  Height: 5\' 7"  (1.702 m)   Body mass index is 48.71 kg/m.  Generalized: Well developed, in no acute distress  Cardiology: normal rate and rhythm, no murmur noted Respiratory: clear to auscultation bilaterally  Neurological examination  Mentation: Alert oriented to time, place, history taking. Follows all commands speech and language fluent Cranial nerve II-XII: Pupils were equal round reactive to light. Extraocular movements were  full, visual field were full  Motor: The motor testing reveals 5 over 5 strength of all 4 extremities. Good symmetric motor tone is noted throughout.  Gait and station: Gait is normal.    DIAGNOSTIC DATA (LABS, IMAGING, TESTING) - I reviewed patient records, labs, notes, testing and imaging myself where available.  No flowsheet data found.   Lab Results  Component Value Date   WBC 5.7 02/29/2020   HGB 15.8 02/29/2020   HCT 46.6 02/29/2020   MCV 96 02/29/2020   PLT 185 02/29/2020      Component Value Date/Time   NA 137 08/22/2020 1003   K 4.1 08/22/2020 1003   CL 98 08/22/2020 1003   CO2 26 08/22/2020 1003   GLUCOSE 135 (H) 08/22/2020 1003   GLUCOSE 115 (H) 09/17/2016 0838   BUN 23 08/22/2020 1003   CREATININE 0.92 08/22/2020 1003   CREATININE 0.98 08/17/2016 0839   CALCIUM 9.2 08/22/2020 1003   PROT 7.1 08/22/2020 1003   ALBUMIN 4.4 08/22/2020 1003   AST 28 08/22/2020 1003   ALT 49 (H) 08/22/2020 1003   ALKPHOS 56 08/22/2020 1003   BILITOT 0.8  08/22/2020 1003   GFRNONAA 95 08/22/2020 1003   GFRNONAA >89 08/17/2016 0839   GFRAA 109 08/22/2020 1003   GFRAA >89 08/17/2016 0839   Lab Results  Component Value Date   CHOL 125 08/22/2020   HDL 29 (L) 08/22/2020   LDLCALC 59 08/22/2020   TRIG 224 (H) 08/22/2020   CHOLHDL 4.3 08/22/2020   Lab Results  Component Value Date   HGBA1C 6.9 (H) 08/22/2020   No results found for: [id number] Lab Results  Component Value Date   TSH 1.240 05/17/2017     ASSESSMENT AND PLAN 54 y.o. year old male  has a past medical history of Epididymal cyst, Family history of premature CAD, History of exercise intolerance, Hyperlipidemia, Hypertension, Left posterior fascicular block, Obesity, Class III, BMI 40-49.9 (morbid obesity) ([place]), Right hydrocele, and Wears glasses. here with     ICD-10-CM   1. OSA on CPAP  G47.33 For home use only DME continuous positive airway pressure (CPAP)   Z99.89 For home use only DME continuous positive  airway pressure (CPAP)     Vicki V [name] is doing well on CPAP therapy.  Compliance report reveals excellent compliance.  There continues to be a significant leak in the 95th percentile of 32.2 L/min.  Per Dr. Edwena Felty previous note, she wished to have minimum pressure increased to 7 cm of water pressure.  I will resend these orders today.  I will reprint download in 6 to 8 weeks to assess response. He was encouraged to continue using CPAP nightly and for greater than 4 hours each night. We will update supply orders as indicated. Risks of untreated sleep apnea review and education materials provided. Healthy lifestyle habits encouraged.  He will continue close follow-up with primary care for diabetes, hyperlipidemia and hypertension.  He will follow up in 1, sooner if needed. He verbalizes understanding and agreement with this plan.   Orders Placed This Encounter  Procedures  . For home use only DME continuous positive airway pressure (CPAP)    Supplies    Order Specific Question:   Length of Need    Answer:   Lifetime    Order Specific Question:   Patient has OSA or probable OSA    Answer:   Yes    Order Specific Question:   Is the patient currently using CPAP in the home    Answer:   Yes    Order Specific Question:   Settings    Answer:   Other see comments    Order Specific Question:   CPAP supplies needed    Answer:   Mask, headgear, cushions, filters, heated tubing and water chamber  . For home use only DME continuous positive airway pressure (CPAP)    Please adjust auto Pap to min pressure of 7cmH20 and max pressure of 20cmH20.    Order Specific Question:   Length of Need    Answer:   Lifetime    Order Specific Question:   Patient has OSA or probable OSA    Answer:   Yes    Order Specific Question:   Is the patient currently using CPAP in the home    Answer:   Yes    Order Specific Question:   Settings    Answer:   Other see comments    Order Specific Question:   CPAP  supplies needed    Answer:   Mask, headgear, cushions, filters, heated tubing and water chamber     No orders of the  defined types were placed in this encounter.     I spent 15 minutes with the patient. 50% of this time was spent counseling and educating patient on plan of care and medications.    Debbora Presto, FNP-C 08/23/2020, 9:25 AM [facility] [address] [place], [place] [address] [phone]

## 2020-08-22 NOTE — Progress Notes (Signed)
Andrew [name]. 54 y.o.   Chief Complaint  Patient presents with  . Medical Management of Chronic Issues    31 m f.u    HISTORY OF PRESENT ILLNESS: This is a 54 y.o. male with history of hypertension diabetes and dyslipidemia here for follow-up and medication refill. Doing well.  Has no complaints or medical concerns today 1.  Diabetes on Metformin 500 mg twice a day. Lab Results  Component Value Date   HGBA1C 6.5 (H) 02/29/2020   2.  Hypertension: On Zestoretic 20-12.5 mg daily. BP Readings from Last 3 Encounters:  08/22/20 (!) 160/90  02/29/20 (!) 150/87  11/30/19 120/82   3.  Dyslipidemia intolerant to statins.  On Zetia 10 mg daily. Lab Results  Component Value Date   CHOL 128 02/29/2020   HDL 31 (L) 02/29/2020   LDLCALC 56 02/29/2020   TRIG 256 (H) 02/29/2020   CHOLHDL 4.1 02/29/2020  CT for calcium scoring done on 08/18/2019 showed the following: Coronary arteries: Normal origin.  IMPRESSION: Coronary calcium score of 13. This was 58 percentile for age and sex matched control.   Electronically Signed   By: Ena Dawley   On: 08/06/2019 21:10 Has appointment to follow-up with cardiologist next month. Non-smoker.  CT chest to evaluate for pulmonary nodule recently done showing the following: IMPRESSION: 1. Stable area of architectural distortion posteriorly in the right lower lobe, likely reflecting postinflammatory scarring. 2. No new or enlarging pulmonary nodules. 3. Hepatic steatosis. 4. Mild coronary artery atherosclerosis.   Electronically Signed   By: Richardean Sale M.D.   On: 08/17/2020 15:08 HPI   Prior to Admission medications   Medication Sig Start Date End Date Taking? Authorizing Provider  aspirin 81 MG tablet Take 81 mg by mouth daily.     Yes [provider]  ezetimibe (ZETIA) 10 MG tablet TAKE 1 TABLET BY MOUTH EVERY DAY 06/20/20  Yes Letty Salvi, Ines Bloomer, MD  lisinopril-hydrochlorothiazide (ZESTORETIC)  20-12.5 MG tablet Take 1 tablet by mouth daily. 02/29/20  Yes Horald Pollen, MD  metFORMIN (GLUCOPHAGE) 500 MG tablet Take 1 tablet (500 mg total) by mouth 2 (two) times daily with a meal. 02/29/20  Yes Stanchfield, Ines Bloomer, MD    Allergies  Allergen Reactions  . Crestor [Rosuvastatin Calcium] Other (See Comments)    Joint pain  . Testosterone Rash    SEVERE RASH WITH ITCHING (TOPICAL AND INJECTION)    Patient Active Problem List   Diagnosis Date Noted  . Statin intolerance 08/22/2020  . High triglycerides 11/30/2019  . Prediabetes 11/28/2018  . OSA on CPAP 05/17/2017  . Morbid obesity ([place]) 05/17/2017  . Hydrocele, right 11/17/2015  . Hypertension associated with diabetes ([place]) 05/13/2012  . Hypogonadism male 05/13/2012    Past Medical History:  Diagnosis Date  . Epididymal cyst    left  . Family history of premature CAD   . History of exercise intolerance    06-03-2015--  dr Angelena Form--  no ischemia or chest pain and no arrhythmias  . Hyperlipidemia   . Hypertension    cardiologist-  dr Angelena Form  . Left posterior fascicular block   . Obesity, Class III, BMI 40-49.9 (morbid obesity) ([place])   . Right hydrocele   . Wears glasses     Past Surgical History:  Procedure Laterality Date  . CARDIOVASCULAR STRESS TEST  04/23/2011   normal nuclear study w/ no ischemia (no change from previous exam)/  normal LV function and wall motion , ef 64%  .  COLONOSCOPY  2012  . HERNIA REPAIR  infant  . HYDROCELE EXCISION Right 09/17/2016   Procedure: HYDROCELECTOMY ADULT;  Surgeon: Kathie Rhodes, MD;  [facility];  Service: Urology;  Laterality: Right;  . WISDOM TOOTH EXTRACTION  2002    Social History   Socioeconomic History  . Marital status: Married    Spouse name: [name]  . Number of children: 1  . Years of education: College  . Highest education level: Not on file  Occupational History  . Occupation: Therapist, occupational: [employer]    Tobacco Use  . Smoking status: Never Smoker  . Smokeless tobacco: Never Used  Substance and Sexual Activity  . Alcohol use: Yes    Alcohol/week: 2.0 - 3.0 standard drinks    Types: 2 - 3 Standard drinks or equivalent per week    Comment: occasionaly beer (12 pk per month)  . Drug use: No  . Sexual activity: Never  Other Topics Concern  . Not on file  Social History Narrative   [name]) and lives at home with his wife and one child.   Patient is working full-time.   Patient has a college education.   Patient is right-handed.   Patient drinks two cups of coffee daily.   Exercise 2 times a week  On treadmill   Social Determinants of Health   Financial Resource Strain:   . Difficulty of Paying Living Expenses: Not on file  Food Insecurity:   . Worried About Charity fundraiser in the Last Year: Not on file  . Ran Out of Food in the Last Year: Not on file  Transportation Needs:   . Lack of Transportation (Medical): Not on file  . Lack of Transportation (Non-Medical): Not on file  Physical Activity:   . Days of Exercise per Week: Not on file  . Minutes of Exercise per Session: Not on file  Stress:   . Feeling of Stress : Not on file  Social Connections:   . Frequency of Communication with Friends and Family: Not on file  . Frequency of Social Gatherings with Friends and Family: Not on file  . Attends Religious Services: Not on file  . Active Member of Clubs or Organizations: Not on file  . Attends Archivist Meetings: Not on file  . Marital Status: Not on file  Intimate Partner Violence:   . Fear of Current or Ex-Partner: Not on file  . Emotionally Abused: Not on file  . Physically Abused: Not on file  . Sexually Abused: Not on file    Family History  Problem Relation Age of Onset  . Heart attack Mother   . COPD Mother   . Heart disease Mother   . Hyperlipidemia Mother   . Heart attack Father   . Heart disease Father   .  Hyperlipidemia Father   . Hypertension Other      Review of Systems  Constitutional: Negative.  Negative for chills and fever.  HENT: Negative.  Negative for congestion and sore throat.   Skin: Negative.  Negative for rash.  All other systems reviewed and are negative.   Today's Vitals   08/22/20 0828  BP: (!) 160/90  Pulse: 70  Temp: 99 F (37.2 C)  TempSrc: Temporal  SpO2: 95%  Weight: (!) 305 lb 6.4 oz (138.5 kg)  Height: '5\' 7"'  (1.702 m)   Body mass index is 47.83 kg/m. Wt Readings from Last  3 Encounters:  08/22/20 (!) 305 lb 6.4 oz (138.5 kg)  02/29/20 (!) 302 lb (137 kg)  11/30/19 (!) 302 lb (137 kg)    Physical Exam Vitals reviewed.  Constitutional:      Appearance: Normal appearance. He is obese.  HENT:     Head: Normocephalic.  Eyes:     Extraocular Movements: Extraocular movements intact.     Conjunctiva/sclera: Conjunctivae normal.     Pupils: Pupils are equal, round, and reactive to light.  Cardiovascular:     Rate and Rhythm: Normal rate and regular rhythm.     Pulses: Normal pulses.     Heart sounds: Normal heart sounds.  Pulmonary:     Effort: Pulmonary effort is normal.     Breath sounds: Normal breath sounds.  Musculoskeletal:        General: Normal range of motion.     Cervical back: Normal range of motion and neck supple.  Skin:    General: Skin is warm and dry.     Capillary Refill: Capillary refill takes less than 2 seconds.  Neurological:     General: No focal deficit present.     Mental Status: He is alert and oriented to person, place, and time.  Psychiatric:        Mood and Affect: Mood normal.        Behavior: Behavior normal.    A total of 30 minutes was spent with the patient, greater than 50% of which was in counseling/coordination of care regarding hypertension, diabetes, dyslipidemia and cardiovascular risks associated with these conditions, review of all medications, review of most recent office visit notes, review of most  recent blood work results, documentation, prognosis and need for follow-up education on nutrition.   ASSESSMENT & PLAN: Clinically stable.  No medical concerns identified during this visit.  Continue present medications.  No changes.  Follow-up in 6 months. Andrew [name] was seen today for medical management of chronic issues.  Diagnoses and all orders for this visit:  Dyslipidemia associated with type 2 diabetes mellitus ([place]) -     Lipid panel  High triglycerides -     ezetimibe (ZETIA) 10 MG tablet; Take 1 tablet (10 mg total) by mouth daily.  Hypertension associated with diabetes ([place]) -     CMP14+EGFR -     POCT glucose (manual entry) -     POCT glycosylated hemoglobin (Hb A1C)  Type 2 diabetes mellitus with hyperglycemia, without long-term current use of insulin (HCC)  Morbid obesity (HCC)  Body mass index (BMI) of 45.0-49.9 in adult [facility])  Statin intolerance    Patient Instructions       If you have lab work done today you will be contacted with your lab results within the next 2 weeks.  If you have not heard from [place] then please contact us. The fastest way to get your results is to register for My Chart.   IF you received an x-ray today, you will receive an invoice from [facility]. Please contact [facility] at [phone] with questions or concerns regarding your invoice.   IF you received labwork today, you will receive an invoice from [facility]. Please contact [facility] at [phone] with questions or concerns regarding your invoice.   Our billing staff will not be able to assist you with questions regarding bills from these companies.  You will be contacted with the lab results as soon as they are available. The fastest way to get your results is to activate your My  Chart account. Instructions are located on the last page of this paperwork. If you have not heard from [place] regarding the results in 2 weeks, please contact this office.      Health  Maintenance, Male Adopting a healthy lifestyle and getting preventive care are important in promoting health and wellness. Ask your health care provider about:  The right schedule for you to have regular tests and exams.  Things you can do on your own to prevent diseases and keep yourself healthy. What should I know about diet, weight, and exercise? Eat a healthy diet   Eat a diet that includes plenty of vegetables, fruits, low-fat dairy products, and lean protein.  Do not eat a lot of foods that are high in solid fats, added sugars, or sodium. Maintain a healthy weight Body mass index (BMI) is a measurement that can be used to identify possible weight problems. It estimates body fat based on height and weight. Your health care provider can help determine your BMI and help you achieve or maintain a healthy weight. Get regular exercise Get regular exercise. This is one of the most important things you can do for your health. Most adults should:  Exercise for at least 150 minutes each week. The exercise should increase your heart rate and make you sweat (moderate-intensity exercise).  Do strengthening exercises at least twice a week. This is in addition to the moderate-intensity exercise.  Spend less time sitting. Even light physical activity can be beneficial. Watch cholesterol and blood lipids Have your blood tested for lipids and cholesterol at 54 years of age, then have this test every 5 years. You may need to have your cholesterol levels checked more often if:  Your lipid or cholesterol levels are high.  You are older than 54 years of age.  You are at high risk for heart disease. What should I know about cancer screening? Many types of cancers can be detected early and may often be prevented. Depending on your health history and family history, you may need to have cancer screening at various ages. This may include screening for:  Colorectal cancer.  Prostate cancer.  Skin  cancer.  Lung cancer. What should I know about heart disease, diabetes, and high blood pressure? Blood pressure and heart disease  High blood pressure causes heart disease and increases the risk of stroke. This is more likely to develop in people who have high blood pressure readings, are of African descent, or are overweight.  Talk with your health care provider about your target blood pressure readings.  Have your blood pressure checked: ? Every 3-5 years if you are 81-16 years of age. ? Every year if you are 76 years old or older.  If you are between the ages of 3 and 65 and are a current or former smoker, ask your health care provider if you should have a one-time screening for abdominal aortic aneurysm (AAA). Diabetes Have regular diabetes screenings. This checks your fasting blood sugar level. Have the screening done:  Once every three years after age 41 if you are at a normal weight and have a low risk for diabetes.  More often and at a younger age if you are overweight or have a high risk for diabetes. What should I know about preventing infection? Hepatitis B If you have a higher risk for hepatitis B, you should be screened for this virus. Talk with your health care provider to find out if you are at risk for hepatitis B  infection. Hepatitis C Blood testing is recommended for:  Everyone born from [birth year] through 1965.  Anyone with known risk factors for hepatitis C. Sexually transmitted infections (STIs)  You should be screened each year for STIs, including gonorrhea and chlamydia, if: ? You are sexually active and are younger than 54 years of age. ? You are older than 54 years of age and your health care provider tells you that you are at risk for this type of infection. ? Your sexual activity has changed since you were last screened, and you are at increased risk for chlamydia or gonorrhea. Ask your health care provider if you are at risk.  Ask your health care provider  about whether you are at high risk for HIV. Your health care provider may recommend a prescription medicine to help prevent HIV infection. If you choose to take medicine to prevent HIV, you should first get tested for HIV. You should then be tested every 3 months for as long as you are taking the medicine. Follow these instructions at home: Lifestyle  Do not use any products that contain nicotine or tobacco, such as cigarettes, e-cigarettes, and chewing tobacco. If you need help quitting, ask your health care provider.  Do not use street drugs.  Do not share needles.  Ask your health care provider for help if you need support or information about quitting drugs. Alcohol use  Do not drink alcohol if your health care provider tells you not to drink.  If you drink alcohol: ? Limit how much you have to 0-2 drinks a day. ? Be aware of how much alcohol is in your drink. In the [place] one drink equals one 12 oz bottle of beer (355 mL), one 5 oz glass of wine (148 mL), or one 1 oz glass of hard liquor (44 mL). General instructions  Schedule regular health, dental, and eye exams.  Stay current with your vaccines.  Tell your health care provider if: ? You often feel depressed. ? You have ever been abused or do not feel safe at home. Summary  Adopting a healthy lifestyle and getting preventive care are important in promoting health and wellness.  Follow your health care provider's instructions about healthy diet, exercising, and getting tested or screened for diseases.  Follow your health care provider's instructions on monitoring your cholesterol and blood pressure. This information is not intended to replace advice given to you by your health care provider. Make sure you discuss any questions you have with your health care provider. Document Revised: 10/08/2018 Document Reviewed: 10/08/2018 Elsevier Patient Education  2020 Elsevier Inc.      Agustina Caroli, MD [facility]

## 2020-08-22 NOTE — Patient Instructions (Addendum)
Please continue using your CPAP regularly. While your insurance requires that you use CPAP at least 4 hours each night on 70% of the nights, I recommend, that you not skip any nights and use it throughout the night if you can. Getting used to CPAP and staying with the treatment long term does take time and patience and discipline. Untreated obstructive sleep apnea when it is moderate to severe can have an adverse impact on cardiovascular health and raise her risk for heart disease, arrhythmias, hypertension, congestive heart failure, stroke and diabetes. Untreated obstructive sleep apnea causes sleep disruption, nonrestorative sleep, and sleep deprivation. This can have an impact on your day to day functioning and cause daytime sleepiness and impairment of cognitive function, memory loss, mood disturbance, and problems focussing. Using CPAP regularly can improve these symptoms.  I will send updated orders to increase pressure setting to 7-20 to see if this helps with leak. I will reprint download in 6-8 weeks to assess. I will call with any concerns.   Follow up in 1 year    Sleep Apnea Sleep apnea affects breathing during sleep. It causes breathing to stop for a short time or to become shallow. It can also increase the risk of:  Heart attack.  Stroke.  Being very overweight (obese).  Diabetes.  Heart failure.  Irregular heartbeat. The goal of treatment is to help you breathe normally again. What are the causes? There are three kinds of sleep apnea:  Obstructive sleep apnea. This is caused by a blocked or collapsed airway.  Central sleep apnea. This happens when the brain does not send the right signals to the muscles that control breathing.  Mixed sleep apnea. This is a combination of obstructive and central sleep apnea. The most common cause of this condition is a collapsed or blocked airway. This can happen if:  Your throat muscles are too relaxed.  Your tongue and tonsils are  too large.  You are overweight.  Your airway is too small. What increases the risk?  Being overweight.  Smoking.  Having a small airway.  Being older.  Being male.  Drinking alcohol.  Taking medicines to calm yourself (sedatives or tranquilizers).  Having family members with the condition. What are the signs or symptoms?  Trouble staying asleep.  Being sleepy or tired during the day.  Getting angry a lot.  Loud snoring.  Headaches in the morning.  Not being able to focus your mind (concentrate).  Forgetting things.  Less interest in sex.  Mood swings.  Personality changes.  Feelings of sadness (depression).  Waking up a lot during the night to pee (urinate).  Dry mouth.  Sore throat. How is this diagnosed?  Your medical history.  A physical exam.  A test that is done when you are sleeping (sleep study). The test is most often done in a sleep lab but may also be done at home. How is this treated?   Sleeping on your side.  Using a medicine to get rid of mucus in your nose (decongestant).  Avoiding the use of alcohol, medicines to help you relax, or certain pain medicines (narcotics).  Losing weight, if needed.  Changing your diet.  Not smoking.  Using a machine to open your airway while you sleep, such as: ? An oral appliance. This is a mouthpiece that shifts your lower jaw forward. ? A CPAP device. This device blows air through a mask when you breathe out (exhale). ? An EPAP device. This has valves that  you put in each nostril. ? A BPAP device. This device blows air through a mask when you breathe in (inhale) and breathe out.  Having surgery if other treatments do not work. It is important to get treatment for sleep apnea. Without treatment, it can lead to:  High blood pressure.  Coronary artery disease.  In men, not being able to have an erection (impotence).  Reduced thinking ability. Follow these instructions at  home: Lifestyle  Make changes that your doctor recommends.  Eat a healthy diet.  Lose weight if needed.  Avoid alcohol, medicines to help you relax, and some pain medicines.  Do not use any products that contain nicotine or tobacco, such as cigarettes, e-cigarettes, and chewing tobacco. If you need help quitting, ask your doctor. General instructions  Take over-the-counter and prescription medicines only as told by your doctor.  If you were given a machine to use while you sleep, use it only as told by your doctor.  If you are having surgery, make sure to tell your doctor you have sleep apnea. You may need to bring your device with you.  Keep all follow-up visits as told by your doctor. This is important. Contact a doctor if:  The machine that you were given to use during sleep bothers you or does not seem to be working.  You do not get better.  You get worse. Get help right away if:  Your chest hurts.  You have trouble breathing in enough air.  You have an uncomfortable feeling in your back, arms, or stomach.  You have trouble talking.  One side of your body feels weak.  A part of your face is hanging down. These symptoms may be an emergency. Do not wait to see if the symptoms will go away. Get medical help right away. Call your local emergency services (911 in the [place] Do not drive yourself to the hospital. Summary  This condition affects breathing during sleep.  The most common cause is a collapsed or blocked airway.  The goal of treatment is to help you breathe normally while you sleep. This information is not intended to replace advice given to you by your health care provider. Make sure you discuss any questions you have with your health care provider. Document Revised: 08/01/2018 Document Reviewed: 06/10/2018 Elsevier Patient Education  [place].

## 2020-08-22 NOTE — Patient Instructions (Addendum)
   If you have lab work done today you will be contacted with your lab results within the next 2 weeks.  If you have not heard from us then please contact us. The fastest way to get your results is to register for My Chart.   IF you received an x-ray today, you will receive an invoice from [facility]. Please contact [facility] at [phone] with questions or concerns regarding your invoice.   IF you received labwork today, you will receive an invoice from [facility]. Please contact [facility] at [phone] with questions or concerns regarding your invoice.   Our billing staff will not be able to assist you with questions regarding bills from these companies.  You will be contacted with the lab results as soon as they are available. The fastest way to get your results is to activate your My Chart account. Instructions are located on the last page of this paperwork. If you have not heard from us regarding the results in 2 weeks, please contact this office.      Health Maintenance, Male Adopting a healthy lifestyle and getting preventive care are important in promoting health and wellness. Ask your health care provider about:  The right schedule for you to have regular tests and exams.  Things you can do on your own to prevent diseases and keep yourself healthy. What should I know about diet, weight, and exercise? Eat a healthy diet   Eat a diet that includes plenty of vegetables, fruits, low-fat dairy products, and lean protein.  Do not eat a lot of foods that are high in solid fats, added sugars, or sodium. Maintain a healthy weight Body mass index (BMI) is a measurement that can be used to identify possible weight problems. It estimates body fat based on height and weight. Your health care provider can help determine your BMI and help you achieve or maintain a healthy weight. Get regular exercise Get regular exercise. This is one of the most important things you  can do for your health. Most adults should:  Exercise for at least 150 minutes each week. The exercise should increase your heart rate and make you sweat (moderate-intensity exercise).  Do strengthening exercises at least twice a week. This is in addition to the moderate-intensity exercise.  Spend less time sitting. Even light physical activity can be beneficial. Watch cholesterol and blood lipids Have your blood tested for lipids and cholesterol at 54 years of age, then have this test every 5 years. You may need to have your cholesterol levels checked more often if:  Your lipid or cholesterol levels are high.  You are older than 54 years of age.  You are at high risk for heart disease. What should I know about cancer screening? Many types of cancers can be detected early and may often be prevented. Depending on your health history and family history, you may need to have cancer screening at various ages. This may include screening for:  Colorectal cancer.  Prostate cancer.  Skin cancer.  Lung cancer. What should I know about heart disease, diabetes, and high blood pressure? Blood pressure and heart disease  High blood pressure causes heart disease and increases the risk of stroke. This is more likely to develop in people who have high blood pressure readings, are of African descent, or are overweight.  Talk with your health care provider about your target blood pressure readings.  Have your blood pressure checked: ? Every 3-5 years if you are 18-39   years of age. ? Every year if you are 40 years old or older.  If you are between the ages of 65 and 75 and are a current or former smoker, ask your health care provider if you should have a one-time screening for abdominal aortic aneurysm (AAA). Diabetes Have regular diabetes screenings. This checks your fasting blood sugar level. Have the screening done:  Once every three years after age 45 if you are at a normal weight and have  a low risk for diabetes.  More often and at a younger age if you are overweight or have a high risk for diabetes. What should I know about preventing infection? Hepatitis B If you have a higher risk for hepatitis B, you should be screened for this virus. Talk with your health care provider to find out if you are at risk for hepatitis B infection. Hepatitis C Blood testing is recommended for:  Everyone born from 1945 through 1965.  Anyone with known risk factors for hepatitis C. Sexually transmitted infections (STIs)  You should be screened each year for STIs, including gonorrhea and chlamydia, if: ? You are sexually active and are younger than 54 years of age. ? You are older than 54 years of age and your health care provider tells you that you are at risk for this type of infection. ? Your sexual activity has changed since you were last screened, and you are at increased risk for chlamydia or gonorrhea. Ask your health care provider if you are at risk.  Ask your health care provider about whether you are at high risk for HIV. Your health care provider may recommend a prescription medicine to help prevent HIV infection. If you choose to take medicine to prevent HIV, you should first get tested for HIV. You should then be tested every 3 months for as long as you are taking the medicine. Follow these instructions at home: Lifestyle  Do not use any products that contain nicotine or tobacco, such as cigarettes, e-cigarettes, and chewing tobacco. If you need help quitting, ask your health care provider.  Do not use street drugs.  Do not share needles.  Ask your health care provider for help if you need support or information about quitting drugs. Alcohol use  Do not drink alcohol if your health care provider tells you not to drink.  If you drink alcohol: ? Limit how much you have to 0-2 drinks a day. ? Be aware of how much alcohol is in your drink. In the [place] one drink equals one 12  oz bottle of beer (355 mL), one 5 oz glass of wine (148 mL), or one 1 oz glass of hard liquor (44 mL). General instructions  Schedule regular health, dental, and eye exams.  Stay current with your vaccines.  Tell your health care provider if: ? You often feel depressed. ? You have ever been abused or do not feel safe at home. Summary  Adopting a healthy lifestyle and getting preventive care are important in promoting health and wellness.  Follow your health care provider's instructions about healthy diet, exercising, and getting tested or screened for diseases.  Follow your health care provider's instructions on monitoring your cholesterol and blood pressure. This information is not intended to replace advice given to you by your health care provider. Make sure you discuss any questions you have with your health care provider. Document Revised: 10/08/2018 Document Reviewed: 10/08/2018 Elsevier Patient Education  2020 Elsevier Inc.  

## 2020-08-23 ENCOUNTER — Encounter: Payer: Self-pay | Admitting: Family Medicine

## 2020-08-23 ENCOUNTER — Ambulatory Visit: Payer: BC Managed Care – PPO | Admitting: Family Medicine

## 2020-08-23 VITALS — BP 140/77 | HR 67 | Ht 67.0 in | Wt 311.0 lb

## 2020-08-23 DIAGNOSIS — Z9989 Dependence on other enabling machines and devices: Secondary | ICD-10-CM | POA: Diagnosis not present

## 2020-08-23 DIAGNOSIS — G4733 Obstructive sleep apnea (adult) (pediatric): Secondary | ICD-10-CM

## 2020-08-23 LAB — CMP14+EGFR
ALT: 49 IU/L — ABNORMAL HIGH (ref 0–44)
AST: 28 IU/L (ref 0–40)
Albumin/Globulin Ratio: 1.6 (ref 1.2–2.2)
Albumin: 4.4 g/dL (ref 3.8–4.9)
Alkaline Phosphatase: 56 IU/L (ref 44–121)
BUN/Creatinine Ratio: 25 — ABNORMAL HIGH (ref 9–20)
BUN: 23 mg/dL (ref 6–24)
Bilirubin Total: 0.8 mg/dL (ref 0.0–1.2)
CO2: 26 mmol/L (ref 20–29)
Calcium: 9.2 mg/dL (ref 8.7–10.2)
Chloride: 98 mmol/L (ref 96–106)
Creatinine, Ser: 0.92 mg/dL (ref 0.76–1.27)
GFR calc Af Amer: 109 mL/min/{1.73_m2} (ref >59)
GFR calc non Af Amer: 95 mL/min/{1.73_m2} (ref >59)
Globulin, Total: 2.7 g/dL (ref 1.5–4.5)
Glucose: 135 mg/dL — ABNORMAL HIGH (ref 65–99)
Potassium: 4.1 mmol/L (ref 3.5–5.2)
Sodium: 137 mmol/L (ref 134–144)
Total Protein: 7.1 g/dL (ref 6.0–8.5)

## 2020-08-23 LAB — LIPID PANEL
Chol/HDL Ratio: 4.3 ratio (ref 0.0–5.0)
Cholesterol, Total: 125 mg/dL (ref 100–199)
HDL: 29 mg/dL — ABNORMAL LOW (ref >39)
LDL Chol Calc (NIH): 59 mg/dL (ref 0–99)
Triglycerides: 224 mg/dL — ABNORMAL HIGH (ref 0–149)
VLDL Cholesterol Cal: 37 mg/dL (ref 5–40)

## 2020-08-23 LAB — HEMOGLOBIN A1C
Est. average glucose Bld gHb Est-mCnc: 151 mg/dL
Hgb A1c MFr Bld: 6.9 % — ABNORMAL HIGH (ref 4.8–5.6)

## 2020-08-23 NOTE — Progress Notes (Signed)
Order for cpap supplies sent to [facility] via community msg. Confirmation received that the order transmitted was successful.  

## 2020-09-12 ENCOUNTER — Encounter: Payer: Self-pay | Admitting: Cardiovascular Disease

## 2020-09-12 ENCOUNTER — Other Ambulatory Visit: Payer: Self-pay

## 2020-09-12 ENCOUNTER — Ambulatory Visit: Payer: BC Managed Care – PPO | Admitting: Cardiovascular Disease

## 2020-09-12 VITALS — BP 134/86 | HR 85 | Ht 67.0 in | Wt 302.0 lb

## 2020-09-12 DIAGNOSIS — I2510 Atherosclerotic heart disease of native coronary artery without angina pectoris: Secondary | ICD-10-CM | POA: Diagnosis not present

## 2020-09-12 DIAGNOSIS — I10 Essential (primary) hypertension: Secondary | ICD-10-CM | POA: Diagnosis not present

## 2020-09-12 NOTE — Patient Instructions (Signed)
Medication Instructions:  Your physician recommends that you continue on your current medications as directed. Please refer to the Current Medication list given to you today.  *If you need a refill on your cardiac medications before your next appointment, please call your pharmacy*   Lab Work: None If you have labs (blood work) drawn today and your tests are completely normal, you will receive your results only by: . MyChart Message (if you have MyChart) OR . A paper copy in the mail If you have any lab test that is abnormal or we need to change your treatment, we will call you to review the results.   Testing/Procedures: None   Follow-Up: At [facility], you and your health needs are our priority.  As part of our continuing mission to provide you with exceptional heart care, we have created designated Provider Care Teams.  These Care Teams include your primary Cardiologist (physician) and Advanced Practice Providers (APPs -  Physician Assistants and Nurse Practitioners) who all work together to provide you with the care you need, when you need it.  We recommend signing up for the patient portal called "MyChart".  Sign up information is provided on this After Visit Summary.  MyChart is used to connect with patients for Virtual Visits (Telemedicine).  Patients are able to view lab/test results, encounter notes, upcoming appointments, etc.  Non-urgent messages can be sent to your provider as well.   To learn more about what you can do with MyChart, go to [email or web address].    Your next appointment:   12 month(s)  The format for your next appointment:   In Person  Provider:   You may see Christopher McAlhany, MD or one of the following Advanced Practice Providers on your designated Care Team:    Dayna Dunn, PA-C  Michele Lenze, PA-C    Other Instructions   

## 2020-09-12 NOTE — Progress Notes (Signed)
Chief Complaint  Patient presents with  . Follow-up    CAD   History of Present Illness: 54 yo [name] with history of DM, HLD, HTN, sleep apnea and mild CAD who is here today for cardiac follow up. I saw him in June 2012 to establish cardiology care given his strong FH of CAD. Both of his parents had premature CAD. At his first visit, he reported lack of energy and constant chest pressure.  Stress myoview 2012 with no ischemia. LVEF normal.  Exercise stress test August 2016 and April 2018 with no ischemia. CT calcium score of 13 in October 2020. He was started on Crestor in primary care and did not tolerate. He is now on Zetia and tolerating.   He is here today for follow up. The patient denies any chest pain, dyspnea, palpitations, lower extremity edema, orthopnea, PND, dizziness, near syncope or syncope. He exercises several days per week. 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   Primary Care Physician: Horald Pollen, MD  Past Medical History:  Diagnosis Date  . Epididymal cyst    left  . Family history of premature CAD   . History of exercise intolerance    06-03-2015--  dr Angelena Form--  no ischemia or chest pain and no arrhythmias  . Hyperlipidemia   . Hypertension    cardiologist-  dr Angelena Form  . Left posterior fascicular block   . Obesity, Class III, BMI 40-49.9 (morbid obesity) ([place])   . Right hydrocele   . Wears glasses     Past Surgical History:  Procedure Laterality Date  . CARDIOVASCULAR STRESS TEST  04/23/2011   normal nuclear study w/ no ischemia (no change from previous exam)/  normal LV function and wall motion , ef 64%  . COLONOSCOPY  2012  . HERNIA REPAIR  infant  . HYDROCELE EXCISION Right 09/17/2016   Procedure: HYDROCELECTOMY ADULT;  Surgeon: Kathie Rhodes, MD;  Location: [facility];  Service: Urology;  Laterality: Right;  . WISDOM TOOTH EXTRACTION  2002    Current Outpatient Medications  Medication Sig Dispense Refill  . aspirin 81 MG  tablet Take 81 mg by mouth daily.      [name] (ZETIA) 10 MG tablet Take 1 tablet (10 mg total) by mouth daily. 90 tablet 3  . lisinopril-hydrochlorothiazide (ZESTORETIC) 20-12.5 MG tablet Take 1 tablet by mouth daily. 90 tablet 3  . metFORMIN (GLUCOPHAGE) 500 MG tablet Take 1 tablet (500 mg total) by mouth 2 (two) times daily with a meal. 180 tablet 3   No current facility-administered medications for this visit.    Allergies  Allergen Reactions  . Crestor [Rosuvastatin Calcium] Other (See Comments)    Joint pain  . Testosterone Rash    SEVERE RASH WITH ITCHING (TOPICAL AND INJECTION)    Social History   Socioeconomic History  . Marital status: Married    Spouse name: Andrew [name]  . Number of children: 1  . Years of education: College  . Highest education level: Not on file  Occupational History  . Occupation: Therapist, occupational: [name]  Tobacco Use  . Smoking status: Never Smoker  . Smokeless tobacco: Never Used  Substance and Sexual Activity  . Alcohol use: Yes    Alcohol/week: 2.0 - 3.0 standard drinks    Types: 2 - 3 Standard drinks or equivalent per week    Comment: occasionaly beer (12 pk per month)  . Drug use: No  . Sexual activity:  Never  Other Topics Concern  . Not on file  Social History Narrative   Patient is married Andrew [name]) and lives at home with his wife and one child.   Patient is working full-time.   Patient has a college education.   Patient is right-handed.   Patient drinks two cups of coffee daily.   Exercise 2 times a week  On treadmill   Social Determinants of Health   Financial Resource Strain:   . Difficulty of Paying Living Expenses: Not on file  Food Insecurity:   . Worried About Charity fundraiser in the Last Year: Not on file  . Ran Out of Food in the Last Year: Not on file  Transportation Needs:   . Lack of Transportation (Medical): Not on file  . Lack of Transportation (Non-Medical): Not on file  Physical  Activity:   . Days of Exercise per Week: Not on file  . Minutes of Exercise per Session: Not on file  Stress:   . Feeling of Stress : Not on file  Social Connections:   . Frequency of Communication with Friends and Family: Not on file  . Frequency of Social Gatherings with Friends and Family: Not on file  . Attends Religious Services: Not on file  . Active Member of Clubs or Organizations: Not on file  . Attends Archivist Meetings: Not on file  . Marital Status: Not on file  Intimate Partner Violence:   . Fear of Current or Ex-Partner: Not on file  . Emotionally Abused: Not on file  . Physically Abused: Not on file  . Sexually Abused: Not on file    Family History  Problem Relation Age of Onset  . Heart attack Mother   . COPD Mother   . Heart disease Mother   . Hyperlipidemia Mother   . Heart attack Father   . Heart disease Father   . Hyperlipidemia Father   . Hypertension Other     Review of Systems:  As stated in the HPI and otherwise negative.   BP 134/Andrew   Pulse 85   Ht 5\' 7"  (1.702 m)   Wt (!) 302 lb (137 kg)   SpO2 95%   BMI 47.30 kg/m   Physical Examination: General: Well developed, well nourished, NAD  HEENT: OP clear, mucus membranes moist  SKIN: warm, dry. No rashes. Neuro: No focal deficits  Musculoskeletal: Muscle strength 5/5 all ext  Psychiatric: Mood and affect normal  Neck: No JVD, no carotid bruits, no thyromegaly, no lymphadenopathy.  Lungs:Clear bilaterally, no wheezes, rhonci, crackles Cardiovascular: Regular rate and rhythm. No murmurs, gallops or rubs. Abdomen:Soft. Bowel sounds present. Non-tender.  Extremities: No lower extremity edema. Pulses are 2 + in the bilateral DP/PT.  EKG:  EKG is ordered today.  The EKG is personally reviewed and shows Sinus  Recent Labs: 02/29/2020: Hemoglobin 15.8; Platelets 185 08/22/2020: ALT 49; BUN 23; Creatinine, Ser 0.92; Potassium 4.1; Sodium 137   Lipid Panel    Component Value  Date/Time   CHOL 125 08/22/2020 1003   TRIG 224 (H) 08/22/2020 1003   HDL 29 (L) 08/22/2020 1003   CHOLHDL 4.3 08/22/2020 1003   CHOLHDL 5.4 (H) 08/17/2016 0847   VLDL 53 (H) 08/17/2016 0847   LDLCALC 59 08/22/2020 1003     Wt Readings from Last 3 Encounters:  09/12/20 (!) 302 lb (137 kg)  08/23/20 (!) 311 lb (141.1 kg)  08/22/20 (!) 305 lb 6.4 oz (138.5 kg)  Other studies Reviewed: Additional studies/ records that were reviewed today include: . Review of the above records demonstrates:    Assessment and Plan:   1. Chest pain: No chest pain. Normal stress testing in 2018.Calcium score of 13 in 2020. He is very active. No further workup at this time.   2. HTN: BP is well controlled. Continue current therapy  Current medicines are reviewed at length with the patient today.  The patient does not have concerns regarding medicines.  The following changes have been made:  no change  Labs/ tests ordered today include:   Orders Placed This Encounter  Procedures  . EKG 12-Lead    Disposition:   FU with me in 12  months  Signed, Lauree Chandler, MD 09/12/2020 8:58 AM    [facility] [place], [place], [place]  [address] Phone: [phone]; Fax: [phone]

## 2021-01-04 ENCOUNTER — Telehealth: Payer: Self-pay | Admitting: Cardiovascular Disease

## 2021-01-04 NOTE — Telephone Encounter (Signed)
Adv patient that the screening is okay to do, that they will likely look at his carotids, abdomen and legs, those studies have not already been completed by [place].  He wanted to ask if they are legitimate screenings.  Adv they are done in our office as well.  He is going complete and have results sent to our office.

## 2021-01-04 NOTE — Telephone Encounter (Signed)
Patient states he received information on a life line screening for plaque build up. He would like to know if Dr. Angelena Form thinks it could be beneficial. He states if not he will not bother with it.

## 2021-01-04 NOTE — Telephone Encounter (Signed)
Patient calling back. He states he has not done the test yet. He is asking if the testing would be beneficial or not. He states he does not want to pay for it if it is not.

## 2021-01-04 NOTE — Telephone Encounter (Signed)
Left detailed message (self identified vm) to either put in mail or drop off at the office the results from the life line screening because it would be good information to have in his chart and for risk stratification.

## 2021-02-20 ENCOUNTER — Other Ambulatory Visit: Payer: Self-pay

## 2021-02-20 ENCOUNTER — Other Ambulatory Visit: Payer: Self-pay | Admitting: Emergency Medicine

## 2021-02-20 ENCOUNTER — Ambulatory Visit: Payer: BC Managed Care – PPO | Admitting: Emergency Medicine

## 2021-02-20 ENCOUNTER — Encounter: Payer: Self-pay | Admitting: Emergency Medicine

## 2021-02-20 VITALS — BP 124/78 | HR 70 | Temp 98.3°F | Ht 67.0 in | Wt 300.6 lb

## 2021-02-20 DIAGNOSIS — Z789 Other specified health status: Secondary | ICD-10-CM | POA: Diagnosis not present

## 2021-02-20 DIAGNOSIS — E1159 Type 2 diabetes mellitus with other circulatory complications: Secondary | ICD-10-CM | POA: Diagnosis not present

## 2021-02-20 DIAGNOSIS — G4733 Obstructive sleep apnea (adult) (pediatric): Secondary | ICD-10-CM | POA: Diagnosis not present

## 2021-02-20 DIAGNOSIS — E781 Pure hyperglyceridemia: Secondary | ICD-10-CM

## 2021-02-20 DIAGNOSIS — I152 Hypertension secondary to endocrine disorders: Secondary | ICD-10-CM | POA: Diagnosis not present

## 2021-02-20 DIAGNOSIS — Z9989 Dependence on other enabling machines and devices: Secondary | ICD-10-CM

## 2021-02-20 LAB — LIPID PANEL
Cholesterol: 109 mg/dL (ref 0–200)
HDL: 29.2 mg/dL — ABNORMAL LOW (ref >39.00)
NonHDL: 79.47
Total CHOL/HDL Ratio: 4
Triglycerides: 242 mg/dL — ABNORMAL HIGH (ref 0.0–149.0)
VLDL: 48.4 mg/dL — ABNORMAL HIGH (ref 0.0–40.0)

## 2021-02-20 LAB — COMPREHENSIVE METABOLIC PANEL
ALT: 43 U/L (ref 0–53)
AST: 31 U/L (ref 0–37)
Albumin: 4.1 g/dL (ref 3.5–5.2)
Alkaline Phosphatase: 47 U/L (ref 39–117)
BUN: 25 mg/dL — ABNORMAL HIGH (ref 6–23)
CO2: 27 mEq/L (ref 19–32)
Calcium: 9.2 mg/dL (ref 8.4–10.5)
Chloride: 102 mEq/L (ref 96–112)
Creatinine, Ser: 0.87 mg/dL (ref 0.40–1.50)
GFR: 97.85 mL/min (ref >60.00)
Glucose, Bld: 135 mg/dL — ABNORMAL HIGH (ref 70–99)
Potassium: 3.9 mEq/L (ref 3.5–5.1)
Sodium: 138 mEq/L (ref 135–145)
Total Bilirubin: 0.9 mg/dL (ref 0.2–1.2)
Total Protein: 7.1 g/dL (ref 6.0–8.3)

## 2021-02-20 LAB — LDL CHOLESTEROL, DIRECT: Direct LDL: 65 mg/dL

## 2021-02-20 LAB — HEMOGLOBIN A1C: Hgb A1c MFr Bld: 7.5 % — ABNORMAL HIGH (ref 4.6–6.5)

## 2021-02-20 MED ORDER — DAPAGLIFLOZIN PROPANEDIOL 5 MG PO TABS: 5.0000 mg | ORAL_TABLET | Freq: Every day | ORAL | 3 refills | Status: AC

## 2021-02-20 MED ORDER — EZETIMIBE 10 MG PO TABS: 10.0000 mg | ORAL_TABLET | Freq: Every day | ORAL | 3 refills | Status: DC

## 2021-02-20 NOTE — Progress Notes (Signed)
Andrew [name]. 55 y.o.   Chief Complaint  Patient presents with  . Diabetes  . Hypertension    Follow up 6 months  . Medication Refill    Zetia   Lab Results  Component Value Date   HGBA1C 6.9 (H) 08/22/2020   BP Readings from Last 3 Encounters:  02/20/21 124/78  09/12/20 134/86  08/23/20 140/77   Assessment and Plan:   1. Chest pain: No chest pain. Normal stress testing in 2018.Calcium score of 13 in 2020. He is very active. No further workup at this time.   2. HTN: BP is well controlled. Continue current therapy  Current medicines are reviewed at length with the patient today.  The patient does not have concerns regarding medicines.  The following changes have been made:  no change  Labs/ tests ordered today include:      Orders Placed This Encounter  Procedures  . EKG 12-Lead    Disposition:   FU with me in 12  months  Signed, Andrew Chandler, MD 09/12/2020 8:58 AM    [facility] [place], [place], [place]  [address] Phone: [phone]; Fax: [phone]   HISTORY OF PRESENT ILLNESS: This is a 55 y.o. male with history of diabetes and hypertension here for follow-up. Recent cardiologist office visit notes reviewed.  Doing well. #1 diabetes: On metformin 500 mg twice a day #2 hypertension: On Zestoretic 20-12.5 mg daily. #3 dyslipidemia: On Zetia 10 mg daily Also takes 1 baby aspirins daily. Doing well has no complaints or medical concerns today. Up-to-date with colonoscopy. Physically active and eating better.  HPI   Prior to Admission medications   Medication Sig Start Date End Date Taking? Authorizing Provider  aspirin 81 MG tablet Take 81 mg by mouth daily.      [provider]  ezetimibe (ZETIA) 10 MG tablet Take 1 tablet (10 mg total) by mouth daily. 08/22/20   Andrew Pollen, MD  lisinopril-hydrochlorothiazide (ZESTORETIC) 20-12.5 MG tablet Take 1 tablet by mouth daily.  02/29/20   Andrew Pollen, MD  metFORMIN (GLUCOPHAGE) 500 MG tablet Take 1 tablet (500 mg total) by mouth 2 (two) times daily with a meal. 02/29/20   Andrew [name], Andrew Bloomer, MD    Allergies  Allergen Reactions  . Crestor [Rosuvastatin Calcium] Other (See Comments)    Joint pain  . Testosterone Rash    SEVERE RASH WITH ITCHING (TOPICAL AND INJECTION)    Patient Active Problem List   Diagnosis Date Noted  . Statin intolerance 08/22/2020  . High triglycerides 11/30/2019  . Prediabetes 11/28/2018  . OSA on CPAP 05/17/2017  . Morbid obesity ([place]) 05/17/2017  . Hydrocele, right 11/17/2015  . Hypertension associated with diabetes ([place]) 05/13/2012  . Hypogonadism male 05/13/2012    Past Medical History:  Diagnosis Date  . Epididymal cyst    left  . Family history of premature CAD   . History of exercise intolerance    06-03-2015--  dr Angelena Form--  no ischemia or chest pain and no arrhythmias  . Hyperlipidemia   . Hypertension    cardiologist-  dr Angelena Form  . Left posterior fascicular block   . Obesity, Class III, BMI 40-49.9 (morbid obesity) ([place])   . Right hydrocele   . Wears glasses     Past Surgical History:  Procedure Laterality Date  . CARDIOVASCULAR STRESS TEST  04/23/2011   normal nuclear study w/ no ischemia (no change from previous exam)/  normal LV  function and wall motion , ef 64%  . COLONOSCOPY  2012  . HERNIA REPAIR  infant  . HYDROCELE EXCISION Right 09/17/2016   Procedure: HYDROCELECTOMY ADULT;  Surgeon: Kathie Rhodes, MD;  Location: [facility];  Service: Urology;  Laterality: Right;  . WISDOM TOOTH EXTRACTION  2002    Social History   Socioeconomic History  . Marital status: Married    Spouse name: Andrew [name]  . Number of children: 1  . Years of education: College  . Highest education level: Not on file  Occupational History  . Occupation: Therapist, occupational: [employer]  Tobacco Use  . Smoking status: Never Smoker  .  Smokeless tobacco: Never Used  Substance and Sexual Activity  . Alcohol use: Yes    Alcohol/week: 2.0 - 3.0 standard drinks    Types: 2 - 3 Standard drinks or equivalent per week    Comment: occasionaly beer (12 pk per month)  . Drug use: No  . Sexual activity: Never  Other Topics Concern  . Not on file  Social History Narrative   Patient is married Andrew [name]) and lives at home with his wife and one child.   Patient is working full-time.   Patient has a college education.   Patient is right-handed.   Patient drinks two cups of coffee daily.   Exercise 2 times a week  On treadmill   Social Determinants of Health   Financial Resource Strain: Not on file  Food Insecurity: Not on file  Transportation Needs: Not on file  Physical Activity: Not on file  Stress: Not on file  Social Connections: Not on file  Intimate Partner Violence: Not on file    Family History  Problem Relation Age of Onset  . Heart attack Mother   . COPD Mother   . Heart disease Mother   . Hyperlipidemia Mother   . Heart attack Father   . Heart disease Father   . Hyperlipidemia Father   . Hypertension Other      Review of Systems  Constitutional: Negative.  Negative for chills and fever.  HENT: Negative.  Negative for congestion and sore throat.   Respiratory: Negative.  Negative for cough and shortness of breath.   Cardiovascular: Negative.  Negative for chest pain and palpitations.  Gastrointestinal: Negative.  Negative for abdominal pain, blood in stool, diarrhea, melena, nausea and vomiting.  Genitourinary: Negative.  Negative for dysuria and hematuria.  Skin: Negative.  Negative for rash.  Neurological: Negative for dizziness and headaches.  All other systems reviewed and are negative.   Today's Vitals   02/20/21 0843  BP: 124/78  Pulse: 70  Temp: 98.3 F (36.8 C)  TempSrc: Oral  SpO2: 97%  Weight: (!) 300 lb 9.6 oz (136.4 kg)  Height: 5\' 7"  (1.702 m)   Body mass index is 47.08  kg/m. Wt Readings from Last 3 Encounters:  02/20/21 (!) 300 lb 9.6 oz (136.4 kg)  09/12/20 (!) 302 lb (137 kg)  08/23/20 (!) 311 lb (141.1 kg)    Physical Exam Vitals reviewed.  Constitutional:      Appearance: Normal appearance.  HENT:     Head: Normocephalic.  Eyes:     Extraocular Movements: Extraocular movements intact.     Conjunctiva/sclera: Conjunctivae normal.     Pupils: Pupils are equal, round, and reactive to light.  Cardiovascular:     Rate and Rhythm: Normal rate and regular rhythm.     Pulses: Normal pulses.  Heart sounds: Normal heart sounds.  Pulmonary:     Effort: Pulmonary effort is normal.     Breath sounds: Normal breath sounds.  Musculoskeletal:        General: Normal range of motion.     Cervical back: Normal range of motion and neck supple.  Skin:    General: Skin is warm and dry.     Capillary Refill: Capillary refill takes less than 2 seconds.  Neurological:     General: No focal deficit present.     Mental Status: He is alert and oriented to person, place, and time.  Psychiatric:        Mood and Affect: Mood normal.    A total of 30 minutes was spent with the patient and counseling/coordination of care regarding diabetes, hypertension and dyslipidemia and cardiovascular risks associated with these conditions, review of all medications, education on nutrition, review of most recent office visit notes, review of most recent blood work results, health maintenance items, modifiable risk factors for cardiovascular disease, prognosis, documentation, and need for follow-up.   ASSESSMENT & PLAN: Hypertension associated with diabetes ([place]) Well-controlled hypertension.  Continue Zestoretic 20-12.5 mg daily. Staying physically active and eating better.  Continue metformin 500 mg twice a day Blood work done today. Continue baby aspirin daily. Diet and nutrition discussed. Follow-up in 3 to 6 months.  OSA on CPAP Discussed.  Compliant with CPAP  treatment.  High triglycerides Diet and nutrition discussed.  Fasting lipid profile done today. Continue Zetia 10 mg daily.  Intolerant to statins.  Morbid obesity ([place]) Diet and nutrition discussed.  Wolfgang was seen today for diabetes, hypertension and medication refill.  Diagnoses and all orders for this visit:  Hypertension associated with diabetes ([place]) -     Comprehensive metabolic panel -     Hemoglobin A1c -     Lipid panel  Statin intolerance  OSA on CPAP  Morbid obesity ([facility])  High triglycerides -     ezetimibe (ZETIA) 10 MG tablet; Take 1 tablet (10 mg total) by mouth daily.    Patient Instructions   Diabetes Mellitus and Nutrition, Adult When you have diabetes, or diabetes mellitus, it is very important to have healthy eating habits because your blood sugar (glucose) levels are greatly affected by what you eat and drink. Eating healthy foods in the right amounts, at about the same times every day, can help you:  Control your blood glucose.  Lower your risk of heart disease.  Improve your blood pressure.  Reach or maintain a healthy weight. What can affect my meal plan? Every person with diabetes is different, and each person has different needs for a meal plan. Your health care provider may recommend that you work with a dietitian to make a meal plan that is best for you. Your meal plan may vary depending on factors such as:  The calories you need.  The medicines you take.  Your weight.  Your blood glucose, blood pressure, and cholesterol levels.  Your activity level.  Other health conditions you have, such as heart or kidney disease. How do carbohydrates affect me? Carbohydrates, also called carbs, affect your blood glucose level more than any other type of food. Eating carbs naturally raises the amount of glucose in your blood. Carb counting is a method for keeping track of how many carbs you eat. Counting carbs is important to keep your blood  glucose at a healthy level, especially if you use insulin or take certain oral diabetes medicines.  It is important to know how many carbs you can safely have in each meal. This is different for every person. Your dietitian can help you calculate how many carbs you should have at each meal and for each snack. How does alcohol affect me? Alcohol can cause a sudden decrease in blood glucose (hypoglycemia), especially if you use insulin or take certain oral diabetes medicines. Hypoglycemia can be a life-threatening condition. Symptoms of hypoglycemia, such as sleepiness, dizziness, and confusion, are similar to symptoms of having too much alcohol.  Do not drink alcohol if: ? Your health care provider tells you not to drink. ? You are pregnant, may be pregnant, or are planning to become pregnant.  If you drink alcohol: ? Do not drink on an empty stomach. ? Limit how much you use to:  0-1 drink a day for women.  0-2 drinks a day for men. ? Be aware of how much alcohol is in your drink. In the [place] one drink equals one 12 oz bottle of beer (355 mL), one 5 oz glass of wine (148 mL), or one 1 oz glass of hard liquor (44 mL). ? Keep yourself hydrated with water, diet soda, or unsweetened iced tea.  Keep in mind that regular soda, juice, and other mixers may contain a lot of sugar and must be counted as carbs. What are tips for following this plan? Reading food labels  Start by checking the serving size on the "Nutrition Facts" label of packaged foods and drinks. The amount of calories, carbs, fats, and other nutrients listed on the label is based on one serving of the item. Many items contain more than one serving per package.  Check the total grams (g) of carbs in one serving. You can calculate the number of servings of carbs in one serving by dividing the total carbs by 15. For example, if a food has 30 g of total carbs per serving, it would be equal to 2 servings of carbs.  Check the number of  grams (g) of saturated fats and trans fats in one serving. Choose foods that have a low amount or none of these fats.  Check the number of milligrams (mg) of salt (sodium) in one serving. Most people should limit total sodium intake to less than 2,300 mg per day.  Always check the nutrition information of foods labeled as "low-fat" or "nonfat." These foods may be higher in added sugar or refined carbs and should be avoided.  Talk to your dietitian to identify your daily goals for nutrients listed on the label. Shopping  Avoid buying canned, pre-made, or processed foods. These foods tend to be high in fat, sodium, and added sugar.  Shop around the outside edge of the grocery store. This is where you will most often find fresh fruits and vegetables, bulk grains, fresh meats, and fresh dairy. Cooking  Use low-heat cooking methods, such as baking, instead of high-heat cooking methods like deep frying.  Cook using healthy oils, such as olive, canola, or sunflower oil.  Avoid cooking with butter, cream, or high-fat meats. Meal planning  Eat meals and snacks regularly, preferably at the same times every day. Avoid going long periods of time without eating.  Eat foods that are high in fiber, such as fresh fruits, vegetables, beans, and whole grains. Talk with your dietitian about how many servings of carbs you can eat at each meal.  Eat 4-6 oz (112-168 g) of lean protein each day, such as lean meat, chicken,  fish, eggs, or tofu. One ounce (oz) of lean protein is equal to: ? 1 oz (28 g) of meat, chicken, or fish. ? 1 egg. ?  cup (62 g) of tofu.  Eat some foods each day that contain healthy fats, such as avocado, nuts, seeds, and fish.   What foods should I eat? Fruits Berries. Apples. Oranges. Peaches. Apricots. Plums. Grapes. Mango. Papaya. Pomegranate. Kiwi. Cherries. Vegetables Lettuce. Spinach. Leafy greens, including kale, chard, collard greens, and mustard greens. Beets. Cauliflower.  Cabbage. Broccoli. Carrots. Green beans. Tomatoes. Peppers. Onions. Cucumbers. Brussels sprouts. Grains Whole grains, such as whole-wheat or whole-grain bread, crackers, tortillas, cereal, and pasta. Unsweetened oatmeal. Quinoa. Brown or wild rice. Meats and other proteins Seafood. Poultry without skin. Lean cuts of poultry and beef. Tofu. Nuts. Seeds. Dairy Low-fat or fat-free dairy products such as milk, yogurt, and cheese. The items listed above may not be a complete list of foods and beverages you can eat. Contact a dietitian for more information. What foods should I avoid? Fruits Fruits canned with syrup. Vegetables Canned vegetables. Frozen vegetables with butter or cream sauce. Grains Refined white flour and flour products such as bread, pasta, snack foods, and cereals. Avoid all processed foods. Meats and other proteins Fatty cuts of meat. Poultry with skin. Breaded or fried meats. Processed meat. Avoid saturated fats. Dairy Full-fat yogurt, cheese, or milk. Beverages Sweetened drinks, such as soda or iced tea. The items listed above may not be a complete list of foods and beverages you should avoid. Contact a dietitian for more information. Questions to ask a health care provider  Do I need to meet with a diabetes educator?  Do I need to meet with a dietitian?  What number can I call if I have questions?  When are the best times to check my blood glucose? Where to find more information:  American Diabetes Association: diabetes.org  Academy of Nutrition and Dietetics: www.eatright.[employer] of Diabetes and Digestive and Kidney Diseases: [employer]  Association of Diabetes Care and Education Specialists: www.diabeteseducator.org Summary  It is important to have healthy eating habits because your blood sugar (glucose) levels are greatly affected by what you eat and drink.  A healthy meal plan will help you control your blood glucose and maintain  a healthy lifestyle.  Your health care provider may recommend that you work with a dietitian to make a meal plan that is best for you.  Keep in mind that carbohydrates (carbs) and alcohol have immediate effects on your blood glucose levels. It is important to count carbs and to use alcohol carefully. This information is not intended to replace advice given to you by your health care provider. Make sure you discuss any questions you have with your health care provider. Document Revised: 09/22/2019 Document Reviewed: 09/22/2019 Elsevier Patient Education  2021 [place], MD [facility]

## 2021-02-20 NOTE — Assessment & Plan Note (Signed)
Well-controlled hypertension.  Continue Zestoretic 20-12.5 mg daily. Staying physically active and eating better.  Continue metformin 500 mg twice a day Blood work done today. Continue baby aspirin daily. Diet and nutrition discussed. Follow-up in 3 to 6 months.

## 2021-02-20 NOTE — Patient Instructions (Signed)
Diabetes Mellitus and Nutrition, Adult When you have diabetes, or diabetes mellitus, it is very important to have healthy eating habits because your blood sugar (glucose) levels are greatly affected by what you eat and drink. Eating healthy foods in the right amounts, at about the same times every day, can help you:  Control your blood glucose.  Lower your risk of heart disease.  Improve your blood pressure.  Reach or maintain a healthy weight. What can affect my meal plan? Every person with diabetes is different, and each person has different needs for a meal plan. Your health care provider may recommend that you work with a dietitian to make a meal plan that is best for you. Your meal plan may vary depending on factors such as:  The calories you need.  The medicines you take.  Your weight.  Your blood glucose, blood pressure, and cholesterol levels.  Your activity level.  Other health conditions you have, such as heart or kidney disease. How do carbohydrates affect me? Carbohydrates, also called carbs, affect your blood glucose level more than any other type of food. Eating carbs naturally raises the amount of glucose in your blood. Carb counting is a method for keeping track of how many carbs you eat. Counting carbs is important to keep your blood glucose at a healthy level, especially if you use insulin or take certain oral diabetes medicines. It is important to know how many carbs you can safely have in each meal. This is different for every person. Your dietitian can help you calculate how many carbs you should have at each meal and for each snack. How does alcohol affect me? Alcohol can cause a sudden decrease in blood glucose (hypoglycemia), especially if you use insulin or take certain oral diabetes medicines. Hypoglycemia can be a life-threatening condition. Symptoms of hypoglycemia, such as sleepiness, dizziness, and confusion, are similar to symptoms of having too much  alcohol.  Do not drink alcohol if: ? Your health care provider tells you not to drink. ? You are pregnant, may be pregnant, or are planning to become pregnant.  If you drink alcohol: ? Do not drink on an empty stomach. ? Limit how much you use to:  0-1 drink a day for women.  0-2 drinks a day for men. ? Be aware of how much alcohol is in your drink. In the [place] one drink equals one 12 oz bottle of beer (355 mL), one 5 oz glass of wine (148 mL), or one 1 oz glass of hard liquor (44 mL). ? Keep yourself hydrated with water, diet soda, or unsweetened iced tea.  Keep in mind that regular soda, juice, and other mixers may contain a lot of sugar and must be counted as carbs. What are tips for following this plan? Reading food labels  Start by checking the serving size on the "Nutrition Facts" label of packaged foods and drinks. The amount of calories, carbs, fats, and other nutrients listed on the label is based on one serving of the item. Many items contain more than one serving per package.  Check the total grams (g) of carbs in one serving. You can calculate the number of servings of carbs in one serving by dividing the total carbs by 15. For example, if a food has 30 g of total carbs per serving, it would be equal to 2 servings of carbs.  Check the number of grams (g) of saturated fats and trans fats in one serving. Choose foods that have   a low amount or none of these fats.  Check the number of milligrams (mg) of salt (sodium) in one serving. Most people should limit total sodium intake to less than 2,300 mg per day.  Always check the nutrition information of foods labeled as "low-fat" or "nonfat." These foods may be higher in added sugar or refined carbs and should be avoided.  Talk to your dietitian to identify your daily goals for nutrients listed on the label. Shopping  Avoid buying canned, pre-made, or processed foods. These foods tend to be high in fat, sodium, and added  sugar.  Shop around the outside edge of the grocery store. This is where you will most often find fresh fruits and vegetables, bulk grains, fresh meats, and fresh dairy. Cooking  Use low-heat cooking methods, such as baking, instead of high-heat cooking methods like deep frying.  Cook using healthy oils, such as olive, canola, or sunflower oil.  Avoid cooking with butter, cream, or high-fat meats. Meal planning  Eat meals and snacks regularly, preferably at the same times every day. Avoid going long periods of time without eating.  Eat foods that are high in fiber, such as fresh fruits, vegetables, beans, and whole grains. Talk with your dietitian about how many servings of carbs you can eat at each meal.  Eat 4-6 oz (112-168 g) of lean protein each day, such as lean meat, chicken, fish, eggs, or tofu. One ounce (oz) of lean protein is equal to: ? 1 oz (28 g) of meat, chicken, or fish. ? 1 egg. ?  cup (62 g) of tofu.  Eat some foods each day that contain healthy fats, such as avocado, nuts, seeds, and fish.   What foods should I eat? Fruits Berries. Apples. Oranges. Peaches. Apricots. Plums. Grapes. Mango. Papaya. Pomegranate. Kiwi. Cherries. Vegetables Lettuce. Spinach. Leafy greens, including kale, chard, collard greens, and mustard greens. Beets. Cauliflower. Cabbage. Broccoli. Carrots. Green beans. Tomatoes. Peppers. Onions. Cucumbers. Brussels sprouts. Grains Whole grains, such as whole-wheat or whole-grain bread, crackers, tortillas, cereal, and pasta. Unsweetened oatmeal. Quinoa. Brown or wild rice. Meats and other proteins Seafood. Poultry without skin. Lean cuts of poultry and beef. Tofu. Nuts. Seeds. Dairy Low-fat or fat-free dairy products such as milk, yogurt, and cheese. The items listed above may not be a complete list of foods and beverages you can eat. Contact a dietitian for more information. What foods should I avoid? Fruits Fruits canned with  syrup. Vegetables Canned vegetables. Frozen vegetables with butter or cream sauce. Grains Refined white flour and flour products such as bread, pasta, snack foods, and cereals. Avoid all processed foods. Meats and other proteins Fatty cuts of meat. Poultry with skin. Breaded or fried meats. Processed meat. Avoid saturated fats. Dairy Full-fat yogurt, cheese, or milk. Beverages Sweetened drinks, such as soda or iced tea. The items listed above may not be a complete list of foods and beverages you should avoid. Contact a dietitian for more information. Questions to ask a health care provider  Do I need to meet with a diabetes educator?  Do I need to meet with a dietitian?  What number can I call if I have questions?  When are the best times to check my blood glucose? Where to find more information:  American Diabetes Association: diabetes.org  Academy of Nutrition and Dietetics: www.eatright.org  National Institute of Diabetes and Digestive and Kidney Diseases: www.niddk.nih.gov  Association of Diabetes Care and Education Specialists: www.diabeteseducator.org Summary  It is important to have healthy eating   habits because your blood sugar (glucose) levels are greatly affected by what you eat and drink.  A healthy meal plan will help you control your blood glucose and maintain a healthy lifestyle.  Your health care provider may recommend that you work with a dietitian to make a meal plan that is best for you.  Keep in mind that carbohydrates (carbs) and alcohol have immediate effects on your blood glucose levels. It is important to count carbs and to use alcohol carefully. This information is not intended to replace advice given to you by your health care provider. Make sure you discuss any questions you have with your health care provider. Document Revised: 09/22/2019 Document Reviewed: 09/22/2019 Elsevier Patient Education  2021 Elsevier Inc.  

## 2021-02-20 NOTE — Assessment & Plan Note (Signed)
Diet and nutrition discussed.  Fasting lipid profile done today. Continue Zetia 10 mg daily.  Intolerant to statins.

## 2021-02-20 NOTE — Assessment & Plan Note (Signed)
Discussed.  Compliant with CPAP treatment.

## 2021-02-20 NOTE — Assessment & Plan Note (Signed)
Diet and nutrition discussed. 

## 2021-02-24 ENCOUNTER — Other Ambulatory Visit: Payer: Self-pay | Admitting: Emergency Medicine

## 2021-02-24 DIAGNOSIS — I152 Hypertension secondary to endocrine disorders: Secondary | ICD-10-CM

## 2021-04-03 ENCOUNTER — Other Ambulatory Visit: Payer: Self-pay | Admitting: Surgery

## 2021-04-03 ENCOUNTER — Other Ambulatory Visit (HOSPITAL_COMMUNITY): Payer: Self-pay | Admitting: Surgery

## 2021-04-17 ENCOUNTER — Other Ambulatory Visit: Payer: Self-pay

## 2021-04-17 ENCOUNTER — Ambulatory Visit (HOSPITAL_COMMUNITY)
Admission: RE | Admit: 2021-04-17 | Discharge: 2021-04-17 | Disposition: A | Discharge status: Home / Self Care | Payer: BC Managed Care – PPO | Source: Ambulatory Visit | Attending: Surgery | Admitting: Surgery

## 2021-05-08 ENCOUNTER — Other Ambulatory Visit: Payer: Self-pay

## 2021-05-08 ENCOUNTER — Encounter: Payer: Self-pay | Admitting: Emergency Medicine

## 2021-05-08 ENCOUNTER — Ambulatory Visit: Payer: BC Managed Care – PPO | Admitting: Emergency Medicine

## 2021-05-08 VITALS — BP 128/72 | HR 65 | Temp 97.6°F | Ht 67.0 in | Wt 304.0 lb

## 2021-05-08 DIAGNOSIS — E1159 Type 2 diabetes mellitus with other circulatory complications: Secondary | ICD-10-CM

## 2021-05-08 DIAGNOSIS — Z789 Other specified health status: Secondary | ICD-10-CM

## 2021-05-08 DIAGNOSIS — G4733 Obstructive sleep apnea (adult) (pediatric): Secondary | ICD-10-CM

## 2021-05-08 DIAGNOSIS — E785 Hyperlipidemia, unspecified: Secondary | ICD-10-CM

## 2021-05-08 DIAGNOSIS — I152 Hypertension secondary to endocrine disorders: Secondary | ICD-10-CM | POA: Diagnosis not present

## 2021-05-08 DIAGNOSIS — Z9989 Dependence on other enabling machines and devices: Secondary | ICD-10-CM

## 2021-05-08 DIAGNOSIS — E1169 Type 2 diabetes mellitus with other specified complication: Secondary | ICD-10-CM

## 2021-05-08 LAB — POCT GLYCOSYLATED HEMOGLOBIN (HGB A1C): Hemoglobin A1C: 7.6 % — AB (ref 4.0–5.6)

## 2021-05-08 NOTE — Patient Instructions (Signed)
Diabetes Mellitus and Nutrition, Adult When you have diabetes, or diabetes mellitus, it is very important to have healthy eating habits because your blood sugar (glucose) levels are greatly affected by what you eat and drink. Eating healthy foods in the right amounts, at about the same times every day, can help you:  Control your blood glucose.  Lower your risk of heart disease.  Improve your blood pressure.  Reach or maintain a healthy weight. What can affect my meal plan? Every person with diabetes is different, and each person has different needs for a meal plan. Your health care provider may recommend that you work with a dietitian to make a meal plan that is best for you. Your meal plan may vary depending on factors such as:  The calories you need.  The medicines you take.  Your weight.  Your blood glucose, blood pressure, and cholesterol levels.  Your activity level.  Other health conditions you have, such as heart or kidney disease. How do carbohydrates affect me? Carbohydrates, also called carbs, affect your blood glucose level more than any other type of food. Eating carbs naturally raises the amount of glucose in your blood. Carb counting is a method for keeping track of how many carbs you eat. Counting carbs is important to keep your blood glucose at a healthy level, especially if you use insulin or take certain oral diabetes medicines. It is important to know how many carbs you can safely have in each meal. This is different for every person. Your dietitian can help you calculate how many carbs you should have at each meal and for each snack. How does alcohol affect me? Alcohol can cause a sudden decrease in blood glucose (hypoglycemia), especially if you use insulin or take certain oral diabetes medicines. Hypoglycemia can be a life-threatening condition. Symptoms of hypoglycemia, such as sleepiness, dizziness, and confusion, are similar to symptoms of having too much  alcohol.  Do not drink alcohol if: ? Your health care provider tells you not to drink. ? You are pregnant, may be pregnant, or are planning to become pregnant.  If you drink alcohol: ? Do not drink on an empty stomach. ? Limit how much you use to:  0-1 drink a day for women.  0-2 drinks a day for men. ? Be aware of how much alcohol is in your drink. In the [place] one drink equals one 12 oz bottle of beer (355 mL), one 5 oz glass of wine (148 mL), or one 1 oz glass of hard liquor (44 mL). ? Keep yourself hydrated with water, diet soda, or unsweetened iced tea.  Keep in mind that regular soda, juice, and other mixers may contain a lot of sugar and must be counted as carbs. What are tips for following this plan? Reading food labels  Start by checking the serving size on the "Nutrition Facts" label of packaged foods and drinks. The amount of calories, carbs, fats, and other nutrients listed on the label is based on one serving of the item. Many items contain more than one serving per package.  Check the total grams (g) of carbs in one serving. You can calculate the number of servings of carbs in one serving by dividing the total carbs by 15. For example, if a food has 30 g of total carbs per serving, it would be equal to 2 servings of carbs.  Check the number of grams (g) of saturated fats and trans fats in one serving. Choose foods that have   a low amount or none of these fats.  Check the number of milligrams (mg) of salt (sodium) in one serving. Most people should limit total sodium intake to less than 2,300 mg per day.  Always check the nutrition information of foods labeled as "low-fat" or "nonfat." These foods may be higher in added sugar or refined carbs and should be avoided.  Talk to your dietitian to identify your daily goals for nutrients listed on the label. Shopping  Avoid buying canned, pre-made, or processed foods. These foods tend to be high in fat, sodium, and added  sugar.  Shop around the outside edge of the grocery store. This is where you will most often find fresh fruits and vegetables, bulk grains, fresh meats, and fresh dairy. Cooking  Use low-heat cooking methods, such as baking, instead of high-heat cooking methods like deep frying.  Cook using healthy oils, such as olive, canola, or sunflower oil.  Avoid cooking with butter, cream, or high-fat meats. Meal planning  Eat meals and snacks regularly, preferably at the same times every day. Avoid going long periods of time without eating.  Eat foods that are high in fiber, such as fresh fruits, vegetables, beans, and whole grains. Talk with your dietitian about how many servings of carbs you can eat at each meal.  Eat 4-6 oz (112-168 g) of lean protein each day, such as lean meat, chicken, fish, eggs, or tofu. One ounce (oz) of lean protein is equal to: ? 1 oz (28 g) of meat, chicken, or fish. ? 1 egg. ?  cup (62 g) of tofu.  Eat some foods each day that contain healthy fats, such as avocado, nuts, seeds, and fish.   What foods should I eat? Fruits Berries. Apples. Oranges. Peaches. Apricots. Plums. Grapes. Mango. Papaya. Pomegranate. Kiwi. Cherries. Vegetables Lettuce. Spinach. Leafy greens, including kale, chard, collard greens, and mustard greens. Beets. Cauliflower. Cabbage. Broccoli. Carrots. Green beans. Tomatoes. Peppers. Onions. Cucumbers. Brussels sprouts. Grains Whole grains, such as whole-wheat or whole-grain bread, crackers, tortillas, cereal, and pasta. Unsweetened oatmeal. Quinoa. Brown or wild rice. Meats and other proteins Seafood. Poultry without skin. Lean cuts of poultry and beef. Tofu. Nuts. Seeds. Dairy Low-fat or fat-free dairy products such as milk, yogurt, and cheese. The items listed above may not be a complete list of foods and beverages you can eat. Contact a dietitian for more information. What foods should I avoid? Fruits Fruits canned with  syrup. Vegetables Canned vegetables. Frozen vegetables with butter or cream sauce. Grains Refined white flour and flour products such as bread, pasta, snack foods, and cereals. Avoid all processed foods. Meats and other proteins Fatty cuts of meat. Poultry with skin. Breaded or fried meats. Processed meat. Avoid saturated fats. Dairy Full-fat yogurt, cheese, or milk. Beverages Sweetened drinks, such as soda or iced tea. The items listed above may not be a complete list of foods and beverages you should avoid. Contact a dietitian for more information. Questions to ask a health care provider  Do I need to meet with a diabetes educator?  Do I need to meet with a dietitian?  What number can I call if I have questions?  When are the best times to check my blood glucose? Where to find more information:  American Diabetes Association: diabetes.org  Academy of Nutrition and Dietetics: www.eatright.org  National Institute of Diabetes and Digestive and Kidney Diseases: www.niddk.nih.gov  Association of Diabetes Care and Education Specialists: www.diabeteseducator.org Summary  It is important to have healthy eating   habits because your blood sugar (glucose) levels are greatly affected by what you eat and drink.  A healthy meal plan will help you control your blood glucose and maintain a healthy lifestyle.  Your health care provider may recommend that you work with a dietitian to make a meal plan that is best for you.  Keep in mind that carbohydrates (carbs) and alcohol have immediate effects on your blood glucose levels. It is important to count carbs and to use alcohol carefully. This information is not intended to replace advice given to you by your health care provider. Make sure you discuss any questions you have with your health care provider. Document Revised: 09/22/2019 Document Reviewed: 09/22/2019 Elsevier Patient Education  2021 Elsevier Inc.  

## 2021-05-08 NOTE — Assessment & Plan Note (Signed)
Well-controlled hypertension.  Continue Zestoretic 20-12.5 mg daily. Uncontrolled diabetes with hemoglobin A1c of 7.6.  Only taking metformin 500 mg twice a day.  Not taking [place].  Does not want to take any new medication due to upcoming bariatric surgery.

## 2021-05-08 NOTE — Progress Notes (Signed)
Andrew [name]. 55 y.o.   Chief Complaint  Patient presents with   Hypertension    Follow up    HISTORY OF PRESENT ILLNESS: This is a 55 y.o. male with history of hypertension, HDL, and diabetes here for 42-month follow-up. Was also seen recently by [place] surgery.  Planning on getting bariatric surgery.  Needs paperwork filled out. No other complaints or medical concerns today. Forms from surgical office reviewed.  Requesting letter of medical necessity.  Hypertension Pertinent negatives include no chest pain, headaches, palpitations or shortness of breath.    Prior to Admission medications   Medication Sig Start Date End Date Taking? Authorizing Provider  aspirin 81 MG tablet Take 81 mg by mouth daily.   Yes [provider]  dapagliflozin propanediol (FARXIGA) 5 MG TABS tablet Take 1 tablet (5 mg total) by mouth daily before breakfast. 02/20/21 05/21/21 Yes Tyrion Glaude, Ines Bloomer, MD  ezetimibe (ZETIA) 10 MG tablet Take 1 tablet (10 mg total) by mouth daily. 02/20/21  Yes Horald Pollen, MD  lisinopril-hydrochlorothiazide (ZESTORETIC) 20-12.5 MG tablet TAKE 1 TABLET BY MOUTH DAILY 02/24/21  Yes Horald Pollen, MD  metFORMIN (GLUCOPHAGE) 500 MG tablet TAKE 1 TABLET BY MOUTH TWICE DAILY WITH A MEAL 02/24/21  Yes Even Budlong, Ines Bloomer, MD  Multiple Vitamin (MULTIVITAMIN) tablet Take 1 tablet by mouth daily.   Yes [provider]    Allergies  Allergen Reactions   Crestor [Rosuvastatin Calcium] Other (See Comments)    Joint pain   Testosterone Rash    SEVERE RASH WITH ITCHING (TOPICAL AND INJECTION)    Patient Active Problem List   Diagnosis Date Noted   Statin intolerance 08/22/2020   High triglycerides 11/30/2019   Prediabetes 11/28/2018   OSA on CPAP 05/17/2017   Morbid obesity ([place]) 05/17/2017   Hydrocele, right 11/17/2015   Hypertension associated with diabetes ([place]) 05/13/2012   Hypogonadism male 05/13/2012    Past Medical  History:  Diagnosis Date   Epididymal cyst    left   Family history of premature CAD    History of exercise intolerance    06-03-2015--  dr Angelena Form--  no ischemia or chest pain and no arrhythmias   Hyperlipidemia    Hypertension    cardiologist-  dr Angelena Form   Left posterior fascicular block    Obesity, Class III, BMI 40-49.9 (morbid obesity) ([place])    Right hydrocele    Wears glasses     Past Surgical History:  Procedure Laterality Date   CARDIOVASCULAR STRESS TEST  04/23/2011   normal nuclear study w/ no ischemia (no change from previous exam)/  normal LV function and wall motion , ef 64%   COLONOSCOPY  2012   HERNIA REPAIR  infant   HYDROCELE EXCISION Right 09/17/2016   Procedure: HYDROCELECTOMY ADULT;  Surgeon: Kathie Rhodes, MD;  Location: [facility];  Service: Urology;  Laterality: Right;   WISDOM TOOTH EXTRACTION  2002    Social History   Socioeconomic History   Marital status: Married    Spouse name: [name]   Number of children: 1   Years of education: [employer] education level: Not on file  Occupational History   Occupation: Therapist, occupational: [employer]  Tobacco Use   Smoking status: Never   Smokeless tobacco: Never  Substance and Sexual Activity   Alcohol use: Yes    Alcohol/week: 2.0 - 3.0 standard drinks    Types: 2 - 3 Standard  drinks or equivalent per week    Comment: occasionaly beer (12 pk per month)   Drug use: No   Sexual activity: Never  Other Topics Concern   Not on file  Social History Narrative   [name]) and lives at home with his wife and one child.   Patient is working full-time.   Patient has a college education.   Patient is right-handed.   Patient drinks two cups of coffee daily.   Exercise 2 times a week  On treadmill   Social Determinants of Health   Financial Resource Strain: Not on file  Food Insecurity: Not on file  Transportation Needs: Not on file  Physical  Activity: Not on file  Stress: Not on file  Social Connections: Not on file  Intimate Partner Violence: Not on file    Family History  Problem Relation Age of Onset   Heart attack Mother    COPD Mother    Heart disease Mother    Hyperlipidemia Mother    Heart attack Father    Heart disease Father    Hyperlipidemia Father    Hypertension Other      Review of Systems  Constitutional: Negative.  Negative for chills and fever.  HENT: Negative.  Negative for congestion and sore throat.   Respiratory: Negative.  Negative for cough and shortness of breath.   Cardiovascular:  Negative for chest pain and palpitations.  Gastrointestinal:  Negative for abdominal pain, diarrhea, nausea and vomiting.  Genitourinary: Negative.  Negative for dysuria and hematuria.  Musculoskeletal: Negative.   Skin: Negative.  Negative for rash.  Neurological:  Negative for dizziness and headaches.  All other systems reviewed and are negative.  Today's Vitals   05/08/21 0811  BP: 128/72  Pulse: 65  Temp: 97.6 F (36.4 C)  SpO2: 97%  Weight: (!) 304 lb (137.9 kg)  Height: 5\' 7"  (1.702 m)   Body mass index is 47.61 kg/m. Lab Results  Component Value Date   HGBA1C 7.5 (H) 02/20/2021   Wt Readings from Last 3 Encounters:  05/08/21 (!) 304 lb (137.9 kg)  02/20/21 (!) 300 lb 9.6 oz (136.4 kg)  09/12/20 (!) 302 lb (137 kg)   Results for orders placed or performed in visit on 05/08/21 (from the past 24 hour(s))  POCT glycosylated hemoglobin (Hb A1C)     Status: Abnormal   Collection Time: 05/08/21  8:27 AM  Result Value Ref Range   Hemoglobin A1C 7.6 (A) 4.0 - 5.6 %   HbA1c POC (<> result, manual entry)     HbA1c, POC (prediabetic range)     HbA1c, POC (controlled diabetic range)      Physical Exam Vitals reviewed.  Constitutional:      Appearance: Normal appearance. He is obese.  HENT:     Head: Normocephalic.  Eyes:     Extraocular Movements: Extraocular movements intact.      Conjunctiva/sclera: Conjunctivae normal.     Pupils: Pupils are equal, round, and reactive to light.  Cardiovascular:     Rate and Rhythm: Normal rate and regular rhythm.     Pulses: Normal pulses.     Heart sounds: Normal heart sounds.  Pulmonary:     Effort: Pulmonary effort is normal.     Breath sounds: Normal breath sounds.  Abdominal:     General: Bowel sounds are normal. There is no distension.     Palpations: Abdomen is soft.     Tenderness: There is no abdominal tenderness.  Musculoskeletal:        General: Normal range of motion.     Cervical back: Normal range of motion and neck supple.  Skin:    General: Skin is warm and dry.  Neurological:     Mental Status: He is alert.  Results for orders placed or performed in visit on 05/08/21 (from the past 24 hour(s))  POCT glycosylated hemoglobin (Hb A1C)     Status: Abnormal   Collection Time: 05/08/21  8:27 AM  Result Value Ref Range   Hemoglobin A1C 7.6 (A) 4.0 - 5.6 %   HbA1c POC (<> result, manual entry)     HbA1c, POC (prediabetic range)     HbA1c, POC (controlled diabetic range)        ASSESSMENT & PLAN: A total of 30 minutes was spent with the patient and counseling/coordination of care regarding preparing for this visit, review of most recent office visit notes, review of most recent blood work results including today's hemoglobin A1c, discussion of diabetes and hypertension and cardiovascular risk associated with this condition, morbid obesity and education on nutrition, filling out forms requested by bariatric surgical office, review of all medications, prognosis, documentation and need for follow-up.  Hypertension associated with diabetes ([place]) Well-controlled hypertension.  Continue Zestoretic 20-12.5 mg daily. Uncontrolled diabetes with hemoglobin A1c of 7.6.  Only taking metformin 500 mg twice a day.  Not taking [place].  Does not want to take any new medication due to upcoming bariatric surgery.  OSA on  CPAP Stable.  No concerns.  Dyslipidemia associated with type 2 diabetes mellitus ([place]) Diet and nutrition discussed.  Intolerant to statins.  Continue Zetia 10 mg daily.  Morbid obesity ([place]) Diet and nutrition discussed.  Continue plans to undergo bariatric surgery.  Bariatric surgery form requested filled out along with letter of medical necessity.  Gladys was seen today for hypertension.  Diagnoses and all orders for this visit:  Hypertension associated with diabetes ([place]) -     POCT glycosylated hemoglobin (Hb A1C)  Dyslipidemia associated with type 2 diabetes mellitus ([place])  Morbid obesity ([facility])  OSA on CPAP  Patient Instructions  Diabetes Mellitus and Nutrition, Adult When you have diabetes, or diabetes mellitus, it is very important to have healthy eating habits because your blood sugar (glucose) levels are greatly affected by what you eat and drink. Eating healthy foods in the right amounts, at about the same times every day, can help you: Control your blood glucose. Lower your risk of heart disease. Improve your blood pressure. Reach or maintain a healthy weight. What can affect my meal plan? Every person with diabetes is different, and each person has different needs for a meal plan. Your health care provider may recommend that you work with a dietitian to make a meal plan that is best for you. Your meal plan may vary depending on factors such as: The calories you need. The medicines you take. Your weight. Your blood glucose, blood pressure, and cholesterol levels. Your activity level. Other health conditions you have, such as heart or kidney disease. How do carbohydrates affect me? Carbohydrates, also called carbs, affect your blood glucose level more than any other type of food. Eating carbs naturally raises the amount of glucose in your blood. Carb counting is a method for keeping track of how many carbs you eat. Counting carbs is important to keep your blood  glucose at a healthy level,especially if you use insulin or take certain oral diabetes medicines. It is important to  know how many carbs you can safely have in each meal. This is different for every person. Your dietitian can help you calculate how manycarbs you should have at each meal and for each snack. How does alcohol affect me? Alcohol can cause a sudden decrease in blood glucose (hypoglycemia), especially if you use insulin or take certain oral diabetes medicines. Hypoglycemia can be a life-threatening condition. Symptoms of hypoglycemia, such as sleepiness, dizziness, and confusion, are similar to symptoms of having too much alcohol. Do not drink alcohol if: Your health care provider tells you not to drink. You are pregnant, may be pregnant, or are planning to become pregnant. If you drink alcohol: Do not drink on an empty stomach. Limit how much you use to: 0-1 drink a day for women. 0-2 drinks a day for men. Be aware of how much alcohol is in your drink. In the [place] one drink equals one 12 oz bottle of beer (355 mL), one 5 oz glass of wine (148 mL), or one 1 oz glass of hard liquor (44 mL). Keep yourself hydrated with water, diet soda, or unsweetened iced tea. Keep in mind that regular soda, juice, and other mixers may contain a lot of sugar and must be counted as carbs. What are tips for following this plan?  Reading food labels Start by checking the serving size on the "Nutrition Facts" label of packaged foods and drinks. The amount of calories, carbs, fats, and other nutrients listed on the label is based on one serving of the item. Many items contain more than one serving per package. Check the total grams (g) of carbs in one serving. You can calculate the number of servings of carbs in one serving by dividing the total carbs by 15. For example, if a food has 30 g of total carbs per serving, it would be equal to 2 servings of carbs. Check the number of grams (g) of saturated fats  and trans fats in one serving. Choose foods that have a low amount or none of these fats. Check the number of milligrams (mg) of salt (sodium) in one serving. Most people should limit total sodium intake to less than 2,300 mg per day. Always check the nutrition information of foods labeled as "low-fat" or "nonfat." These foods may be higher in added sugar or refined carbs and should be avoided. Talk to your dietitian to identify your daily goals for nutrients listed on the label. Shopping Avoid buying canned, pre-made, or processed foods. These foods tend to be high in fat, sodium, and added sugar. Shop around the outside edge of the grocery store. This is where you will most often find fresh fruits and vegetables, bulk grains, fresh meats, and fresh dairy. Cooking Use low-heat cooking methods, such as baking, instead of high-heat cooking methods like deep frying. Cook using healthy oils, such as olive, canola, or sunflower oil. Avoid cooking with butter, cream, or high-fat meats. Meal planning Eat meals and snacks regularly, preferably at the same times every day. Avoid going long periods of time without eating. Eat foods that are high in fiber, such as fresh fruits, vegetables, beans, and whole grains. Talk with your dietitian about how many servings of carbs you can eat at each meal. Eat 4-6 oz (112-168 g) of lean protein each day, such as lean meat, chicken, fish, eggs, or tofu. One ounce (oz) of lean protein is equal to: 1 oz (28 g) of meat, chicken, or fish. 1 egg.  cup (62 g) of  tofu. Eat some foods each day that contain healthy fats, such as avocado, nuts, seeds, and fish. What foods should I eat? Fruits Berries. Apples. Oranges. Peaches. Apricots. Plums. Grapes. Mango. Papaya.Pomegranate. Kiwi. Cherries. Vegetables Lettuce. Spinach. Leafy greens, including kale, chard, collard greens, and mustard greens. Beets. Cauliflower. Cabbage. Broccoli. Carrots. Green beans.Tomatoes. Peppers.  Onions. Cucumbers. Brussels sprouts. Grains Whole grains, such as whole-wheat or whole-grain bread, crackers, tortillas,cereal, and pasta. Unsweetened oatmeal. Quinoa. Brown or wild rice. Meats and other proteins Seafood. Poultry without skin. Lean cuts of poultry and beef. Tofu. Nuts. Seeds. Dairy Low-fat or fat-free dairy products such as milk, yogurt, and cheese. The items listed above may not be a complete list of foods and beverages you can eat. Contact a dietitian for more information. What foods should I avoid? Fruits Fruits canned with syrup. Vegetables Canned vegetables. Frozen vegetables with butter or cream sauce. Grains Refined white flour and flour products such as bread, pasta, snack foods, andcereals. Avoid all processed foods. Meats and other proteins Fatty cuts of meat. Poultry with skin. Breaded or fried meats. Processed meat.Avoid saturated fats. Dairy Full-fat yogurt, cheese, or milk. Beverages Sweetened drinks, such as soda or iced tea. The items listed above may not be a complete list of foods and beverages you should avoid. Contact a dietitian for more information. Questions to ask a health care provider Do I need to meet with a diabetes educator? Do I need to meet with a dietitian? What number can I call if I have questions? When are the best times to check my blood glucose? Where to find more information: American Diabetes Association: diabetes.org Academy of Nutrition and Dietetics: www.eatright.[employer] of Diabetes and Digestive and Kidney Diseases: [place] Association of Diabetes Care and Education Specialists: www.diabeteseducator.org Summary It is important to have healthy eating habits because your blood sugar (glucose) levels are greatly affected by what you eat and drink. A healthy meal plan will help you control your blood glucose and maintain a healthy lifestyle. Your health care provider may recommend that you work with a  dietitian to make a meal plan that is best for you. Keep in mind that carbohydrates (carbs) and alcohol have immediate effects on your blood glucose levels. It is important to count carbs and to use alcohol carefully. This information is not intended to replace advice given to you by your health care provider. Make sure you discuss any questions you have with your healthcare provider. Document Revised: 09/22/2019 Document Reviewed: 09/22/2019 Elsevier Patient Education  2021 [place], MD [facility]

## 2021-05-08 NOTE — Assessment & Plan Note (Signed)
Stable.  No concerns. °

## 2021-05-08 NOTE — Assessment & Plan Note (Signed)
Diet and nutrition discussed.  Continue plans to undergo bariatric surgery.

## 2021-05-08 NOTE — Assessment & Plan Note (Signed)
Diet and nutrition discussed.  Intolerant to statins.  Continue Zetia 10 mg daily.

## 2021-05-15 ENCOUNTER — Other Ambulatory Visit: Payer: Self-pay

## 2021-05-15 ENCOUNTER — Encounter: Payer: BC Managed Care – PPO | Attending: Surgery | Admitting: Skilled Nursing Facility1

## 2021-05-15 ENCOUNTER — Encounter: Payer: Self-pay | Admitting: Skilled Nursing Facility1

## 2021-05-15 DIAGNOSIS — E119 Type 2 diabetes mellitus without complications: Secondary | ICD-10-CM | POA: Insufficient documentation

## 2021-05-15 NOTE — Progress Notes (Signed)
Nutrition Assessment for Bariatric Surgery Medical Nutrition Therapy Appt Start Time: 9:15    End Time: 10:15  Patient was seen on 05/15/2021 for Pre-Operative Nutrition Assessment. Letter of approval faxed to [facility] bariatric surgery program coordinator on 05/15/2021.   Referral stated Supervised Weight Loss (SWL) visits needed: 0  Pt completed visits.   Pt has cleared nutrition requirements.    Planned surgery: RYGB Pt expectation of surgery: to lose weight Pt expectation of dietitian: none identified     NUTRITION ASSESSMENT   Anthropometrics  Start weight at [facility]: 312 lbs (date: 05/15/2021)  Height: 67 in BMI: 48.87 kg/m2     Clinical  Medical hx: HTN, Diabetes Medications: see list  Labs: A1C 7.6, triglycerides 242, HDL 29.2 Notable signs/symptoms: sweating, shaky when working longer harder hours at work Any previous deficiencies? No  Micronutrient Nutrition Focused Physical Exam: Hair: No issues observed Eyes: No issues observed Mouth: No issues observed Neck: No issues observed Nails: No issues observed Skin: No issues observed  Lifestyle & Dietary Hx  Pt states he is newly diagnosed with diabetes and has not started checking his blood sugars.  Blood sugar monitor given: one touch verio flex lot [id number] x  exp: 07/28/21   24-Hr Dietary Recall First Meal: protein shake + 4 boiled eggs + deer sausage Snack:  Second Meal: sandwich Snack:  Third Meal: pork chop + mashed potatoes Snack:  Beverages: water, protein shake, coffee + creamer   Estimated Energy Needs Calories: 1600   NUTRITION DIAGNOSIS  Overweight/obesity ([place]-3.3) related to past poor dietary habits and physical inactivity as evidenced by patient w/ planned RYGB surgery following dietary guidelines for continued weight loss.    NUTRITION INTERVENTION  Nutrition counseling (C-1) and education (E-2) to facilitate bariatric surgery goals.  Educated pt on micronutrient  deficiencies post surgery and strategies to mitigate that risk Educated the pt on blood sugar management and self management    Pre-Op Goals Reviewed with the Patient Track food and beverage intake (pen and paper, MyFitness Pal, Baritastic app, etc.) Make healthy food choices while monitoring portion sizes Consume 3 meals per day or try to eat every 3-5 hours Avoid concentrated sugars and fried foods Keep sugar & fat in the single digits per serving on food labels Practice CHEWING your food (aim for applesauce consistency) Practice not drinking 15 minutes before, during, and 30 minutes after each meal and snack Avoid all carbonated beverages (ex: soda, sparkling beverages)  Limit caffeinated beverages (ex: coffee, tea, energy drinks) Avoid all sugar-sweetened beverages (ex: regular soda, sports drinks)  Avoid alcohol  Aim for 64-100 ounces of FLUID daily (with at least half of fluid intake being plain water)  Aim for at least 60-80 grams of PROTEIN daily Look for a liquid protein source that contains ?15 g protein and ?5 g carbohydrate (ex: shakes, drinks, shots) Make a list of non-food related activities Physical activity is an important part of a healthy lifestyle so keep it moving! The goal is to reach 150 minutes of exercise per week, including cardiovascular and weight baring activity.  *Goals that are bolded indicate the pt would like to start working towards these  Handouts Provided Include  Bariatric Surgery handouts (Nutrition Visits, Pre-Op Goals, Protein Shakes, Vitamins & Minerals)  Learning Style & Readiness for Change Teaching method utilized: Visual & Auditory  Demonstrated degree of understanding via: Teach Back  Readiness Level: contemplative  Barriers to learning/adherence to lifestyle change: newly diagnose Diabetes      MONITORING &  EVALUATION Dietary intake, weekly physical activity, body weight, and pre-op goals reached at next nutrition visit.    Next Steps   Patient is to follow up at [place] for Pre-Op Class >2 weeks before surgery for further nutrition education.  Pt has completed visits. No further supervised visits required/recomended

## 2021-05-22 ENCOUNTER — Other Ambulatory Visit: Payer: Self-pay

## 2021-05-22 ENCOUNTER — Encounter: Payer: BC Managed Care – PPO | Admitting: Skilled Nursing Facility1

## 2021-05-22 DIAGNOSIS — E119 Type 2 diabetes mellitus without complications: Secondary | ICD-10-CM | POA: Diagnosis not present

## 2021-05-22 NOTE — Progress Notes (Signed)
Pre-Operative Nutrition Class:    Patient was seen on 05/22/2021 for Pre-Operative Bariatric Surgery Education [facility]    Surgery date:  Surgery type: RYGB Start weight at [facility]: 312 pounds Weight today: 311.7 pounds  Samples given per MNT protocol. Patient educated on appropriate usage: Protien 20 shake Lot: [id number] Exp: 03/10/22   Bariatric Advantage Calcium Lot # [id number] Exp: 02/23     procare Vitamins Calcium Lot # [id number] Exp: 01/23   Celebrate Protein Powder   Lot # [id number] Exp: 04/23  The following the learning objectives were met by the patient during this course: Identify Pre-Op Dietary Goals and will begin 2 weeks pre-operatively Identify appropriate sources of fluids and proteins  State protein recommendations and appropriate sources pre and post-operatively Identify Post-Operative Dietary Goals and will follow for 2 weeks post-operatively Identify appropriate multivitamin and calcium sources Describe the need for physical activity post-operatively and will follow MD recommendations State when to call healthcare provider regarding medication questions or post-operative complications When having a diagnosis of diabetes understanding hypoglycemia symptoms and the inclusion of 1 complex carbohydrate per meal  Handouts given during class include: Pre-Op Bariatric Surgery Diet Handout Protein Shake Handout Post-Op Bariatric Surgery Nutrition Handout BELT Program Information Flyer Support Group Information Flyer [facility] Bariatric Supplements Price List  Follow-Up Plan: Patient will follow-up at [facility] 2 weeks post operatively for diet advancement per MD.

## 2021-06-01 ENCOUNTER — Ambulatory Visit: Payer: Self-pay | Admitting: Surgery

## 2021-06-01 NOTE — Progress Notes (Addendum)
PCP -  Cardiologist -   PPM/ICD -  Device Orders -  Rep Notified -   Chest x-ray - CT chest 08-17-20 EKG - 09-12-20 epic Stress Test - 2018 ECHO -  Cardiac Cath -  CT cardiac -08-17-20 epic Hgba1c-- 05-08-21 epic 7.6  Sleep Study -  CPAP -   Fasting Blood Sugar -  Checks Blood Sugar _____ times a day  Blood Thinner Instructions: Aspirin Instructions:  ERAS Protcol - PRE-SURGERY Ensure or G2-   COVID TEST- 06-23-21  Activity-- Anesthesia review: OSA, HTN,DM  Patient denies shortness of breath, fever, cough and chest pain at PAT appointment   All instructions explained to the patient, with a verbal understanding of the material. Patient agrees to go over the instructions while at home for a better understanding. Patient also instructed to self quarantine after being tested for COVID-19. The opportunity to ask questions was provided.

## 2021-06-01 NOTE — Patient Instructions (Addendum)
DUE TO COVID-19 ONLY ONE VISITOR IS ALLOWED TO COME WITH YOU AND STAY IN THE WAITING ROOM ONLY DURING PRE OP AND PROCEDURE DAY OF SURGERY.   TWO VISITOR  MAY VISIT WITH YOU AFTER SURGERY IN YOUR PRIVATE ROOM DURING VISITING HOURS ONLY!  YOU NEED TO HAVE A COVID 19 TEST ON__8-26-22_, THIS TEST MUST BE DONE BEFORE SURGERY,    COVID TESTING SITE [address] [place]   [phone]. Open 8am-3pm. No appointment needed.   ONCE YOUR COVID TEST IS COMPLETED,  PLEASE Wear a mask when in public          Your procedure is scheduled on: 06-27-21   Report to [facility]  Entrance   Report to short stay  at   [facility] [facility]  AM     Call this number if you have problems the morning of surgery [phone]   Remember: MORNING OF SURGERY DRINK:   DRINK 1 G2 drink BEFORE YOU LEAVE HOME, DRINK ALL OF THE  G2 DRINK AT ONE TIME.   NO SOLID FOOD AFTER 600 PM THE NIGHT BEFORE YOUR SURGERY.   YOU MAY DRINK CLEAR FLUIDS.   THE G2 DRINK YOU DRINK BEFORE YOU LEAVE HOME WILL BE THE LAST FLUIDS YOU DRINK BEFORE SURGERY.  PAIN IS EXPECTED AFTER SURGERY AND WILL NOT BE COMPLETELY ELIMINATED. AMBULATION AND TYLENOL WILL HELP REDUCE INCISIONAL AND GAS PAIN. MOVEMENT IS KEY!  YOU ARE EXPECTED TO BE OUT OF BED WITHIN 4 HOURS OF ADMISSION TO YOUR PATIENT ROOM.  SITTING IN THE RECLINER THROUGHOUT THE DAY IS IMPORTANT FOR DRINKING FLUIDS AND MOVING GAS THROUGHOUT THE GI TRACT.  COMPRESSION STOCKINGS SHOULD BE WORN [place] UNLESS YOU ARE WALKING.   INCENTIVE SPIROMETER SHOULD BE USED EVERY HOUR WHILE AWAKE TO DECREASE POST-OPERATIVE COMPLICATIONS SUCH AS PNEUMONIA.  WHEN DISCHARGED HOME, IT IS IMPORTANT TO CONTINUE TO WALK EVERY HOUR AND USE THE INCENTIVE SPIROMETER EVERY HOUR.     CLEAR LIQUID DIET   Foods Allowed                                                                     Foods Excluded water Black Coffee and tea, regular and decaf                              liquids that you cannot  Plain Jell-O any favor except red or purple                        see through such as: Fruit ices (not with fruit pulp)                                               milk, soups, orange juice  Iced Popsicles                                               All solid food  Cranberry, grape and apple juices Sports drinks like Gatorade Lightly seasoned clear broth or consume(fat free) Sugar, honey syrup  Sample Menu Breakfast                                Lunch                                     Supper Cranberry juice                    Beef broth                            Chicken broth Jell-O                                     Grape juice                           Apple juice Coffee or tea                        Jell-O                                      Popsicle                                                Coffee or tea                        Coffee or tea  _____________________________________________________________________          BRUSH YOUR TEETH MORNING OF SURGERY AND RINSE YOUR MOUTH OUT, NO CHEWING GUM CANDY OR MINTS.     Take these medicines the morning of surgery with A SIP OF WATER: Zetia  DO NOT TAKE ANY DIABETIC MEDICATIONS DAY OF YOUR SURGERY  How to Manage Your Diabetes Before and After Surgery  Why is it important to control my blood sugar before and after surgery? Improving blood sugar levels before and after surgery helps healing and can limit problems. A way of improving blood sugar control is eating a healthy diet by:  Eating less sugar and carbohydrates  Increasing activity/exercise  Talking with your doctor about reaching your blood sugar goals High blood sugars (greater than 180 mg/dL) can raise your risk of infections and slow your recovery, so you will need to focus on controlling your diabetes during the weeks before surgery. Make sure that the doctor who takes care of your diabetes knows  about your planned surgery including the date and location.  How do I manage my blood sugar before surgery? Check your blood sugar at least 4 times a day, starting 2 days before surgery, to make sure that the level is not too high or low. Check your blood sugar the morning of your surgery when you wake up and every 2 hours until you get to the Short Stay unit. If your blood sugar is  less than 70 mg/dL, you will need to treat for low blood sugar: Do not take insulin. Treat a low blood sugar (less than 70 mg/dL) with  cup of clear juice (cranberry or apple), 4 glucose tablets, OR glucose gel. Recheck blood sugar in 15 minutes after treatment (to make sure it is greater than 70 mg/dL). If your blood sugar is not greater than 70 mg/dL on recheck, call [phone] for further instructions. Report your blood sugar to the short stay nurse when you get to Short Stay.  If you are admitted to the hospital after surgery: Your blood sugar will be checked by the staff and you will probably be given insulin after surgery (instead of oral diabetes medicines) to make sure you have good blood sugar levels. The goal for blood sugar control after surgery is 80-180 mg/dL.   WHAT DO I DO ABOUT MY DIABETES MEDICATION?  Do not take oral diabetes medicines (pills) the morning of surgery.  THE DAY BEFORE SURGERY, take  Metformin as prescribed      THE MORNING OF SURGERY, do not take Metformin  Reviewed and Endorsed by [facility] Patient Education Committee, August 2015                                You may not have any metal on your body including hair pins and              piercings  Do not wear jewelry,  lotions, powders,perfumes,deodorant                       Men may shave face and neck.   Do not bring valuables to the hospital. [place].  Contacts, dentures or bridgework may not be worn into surgery.    Special Instructions: Please bring CPAP mask  and tubing day of surgery.   Call your primary care provider and ask for instructions regarding Aspirin.               Please read over the following fact sheets you were given: _____________________________________________________________________             [facility] - Preparing for Surgery Before surgery, you can play an important role.  Because skin is not sterile, your skin needs to be as free of germs as possible.  You can reduce the number of germs on your skin by washing with CHG (chlorahexidine gluconate) soap before surgery.  CHG is an antiseptic cleaner which kills germs and bonds with the skin to continue killing germs even after washing. Please DO NOT use if you have an allergy to CHG or antibacterial soaps.  If your skin becomes reddened/irritated stop using the CHG and inform your nurse when you arrive at Short Stay. Do not shave (including legs and underarms) for at least 48 hours prior to the first CHG shower.  You may shave your face/neck. Please follow these instructions carefully:  1.  Shower with CHG Soap the night before surgery and the  morning of Surgery.  2.  If you choose to wash your hair, wash your hair first as usual with your  normal  shampoo.  3.  After you shampoo, rinse your hair and body thoroughly to remove the  shampoo.  4.  Use CHG as you would any other liquid soap.  You can apply chg directly  to the skin and wash                       Gently with a scrungie or clean washcloth.  5.  Apply the CHG Soap to your body ONLY FROM THE NECK DOWN.   Do not use on face/ open                           Wound or open sores. Avoid contact with eyes, ears mouth and genitals (private parts).                       Wash face,  Genitals (private parts) with your normal soap.             6.  Wash thoroughly, paying special attention to the area where your surgery  will be performed.  7.  Thoroughly rinse your body with warm water from the neck  down.  8.  DO NOT shower/wash with your normal soap after using and rinsing off  the CHG Soap.                9.  Pat yourself dry with a clean towel.            10.  Wear clean pajamas.            11.  Place clean sheets on your bed the night of your first shower and do not  sleep with pets. Day of Surgery : Do not apply any lotions/deodorants the morning of surgery.  Please wear clean clothes to the hospital/surgery center.  FAILURE TO FOLLOW THESE INSTRUCTIONS MAY RESULT IN THE CANCELLATION OF YOUR SURGERY PATIENT SIGNATURE_________________________________  NURSE SIGNATURE__________________________________  ____  Incentive Spirometer  An incentive spirometer is a tool that can help keep your lungs clear and active. This tool measures how well you are filling your lungs with each breath. Taking long deep breaths may help reverse or decrease the chance of developing breathing (pulmonary) problems (especially infection) following: A long period of time when you are unable to move or be active. BEFORE THE PROCEDURE  If the spirometer includes an indicator to show your best effort, your nurse or respiratory therapist will set it to a desired goal. If possible, sit up straight or lean slightly forward. Try not to slouch. Hold the incentive spirometer in an upright position. INSTRUCTIONS FOR USE  Sit on the edge of your bed if possible, or sit up as far as you can in bed or on a chair. Hold the incentive spirometer in an upright position. Breathe out normally. Place the mouthpiece in your mouth and seal your lips tightly around it. Breathe in slowly and as deeply as possible, raising the piston or the ball toward the top of the column. Hold your breath for 3-5 seconds or for as long as possible. Allow the piston or ball to fall to the bottom of the column. Remove the mouthpiece from your mouth and breathe out normally. Rest for a few seconds and repeat Steps 1 through 7 at least 10 times  every 1-2 hours when you are awake. Take your time and take a few normal breaths between deep breaths. The spirometer may include an indicator to show your best effort. Use the indicator as a goal to work toward during  each repetition. After each set of 10 deep breaths, practice coughing to be sure your lungs are clear. If you have an incision (the cut made at the time of surgery), support your incision when coughing by placing a pillow or rolled up towels firmly against it. Once you are able to get out of bed, walk around indoors and cough well. You may stop using the incentive spirometer when instructed by your caregiver.  RISKS AND COMPLICATIONS Take your time so you do not get dizzy or light-headed. If you are in pain, you may need to take or ask for pain medication before doing incentive spirometry. It is harder to take a deep breath if you are having pain. AFTER USE Rest and breathe slowly and easily. It can be helpful to keep track of a log of your progress. Your caregiver can provide you with a simple table to help with this. If you are using the spirometer at home, follow these instructions: [place] IF:  You are having difficultly using the spirometer. You have trouble using the spirometer as often as instructed. Your pain medication is not giving enough relief while using the spirometer. You develop fever of 100.5 F (38.1 C) or higher. SEEK IMMEDIATE MEDICAL CARE IF:  You cough up bloody sputum that had not been present before. You develop fever of 102 F (38.9 C) or greater. You develop worsening pain at or near the incision site. MAKE SURE YOU:  Understand these instructions. Will watch your condition. Will get help right away if you are not doing well or get worse. Document Released: 02/25/2007 Document Revised: 01/07/2012 Document Reviewed: 04/28/2007 ExitCare Patient Information 2014 ExitCare,  [place].   ________________________________________________________________________ ____________________________________________________________________     WHAT IS A BLOOD TRANSFUSION? Blood Transfusion Information  A transfusion is the replacement of blood or some of its parts. Blood is made up of multiple cells which provide different functions. Red blood cells carry oxygen and are used for blood loss replacement. White blood cells fight against infection. Platelets control bleeding. Plasma helps clot blood. Other blood products are available for specialized needs, such as hemophilia or other clotting disorders. BEFORE THE TRANSFUSION  Who gives blood for transfusions?  Healthy volunteers who are fully evaluated to make sure their blood is safe. This is blood bank blood. Transfusion therapy is the safest it has ever been in the practice of medicine. Before blood is taken from a donor, a complete history is taken to make sure that person has no history of diseases nor engages in risky social behavior (examples are intravenous drug use or sexual activity with multiple partners). The donor's travel history is screened to minimize risk of transmitting infections, such as malaria. The donated blood is tested for signs of infectious diseases, such as HIV and hepatitis. The blood is then tested to be sure it is compatible with you in order to minimize the chance of a transfusion reaction. If you or a relative donates blood, this is often done in anticipation of surgery and is not appropriate for emergency situations. It takes many days to process the donated blood. RISKS AND COMPLICATIONS Although transfusion therapy is very safe and saves many lives, the main dangers of transfusion include:  Getting an infectious disease. Developing a transfusion reaction. This is an allergic reaction to something in the blood you were given. Every precaution is taken to prevent this. The decision to have a blood  transfusion has been considered carefully by your caregiver before blood is given.  Blood is not given unless the benefits outweigh the risks. AFTER THE TRANSFUSION Right after receiving a blood transfusion, you will usually feel much better and more energetic. This is especially true if your red blood cells have gotten low (anemic). The transfusion raises the level of the red blood cells which carry oxygen, and this usually causes an energy increase. The nurse administering the transfusion will monitor you carefully for complications. HOME CARE INSTRUCTIONS  No special instructions are needed after a transfusion. You may find your energy is better. Speak with your caregiver about any limitations on activity for underlying diseases you may have. SEEK MEDICAL CARE IF:  Your condition is not improving after your transfusion. You develop redness or irritation at the intravenous (IV) site. SEEK IMMEDIATE MEDICAL CARE IF:  Any of the following symptoms occur over the next 12 hours: Shaking chills. You have a temperature by mouth above 102 F (38.9 C), not controlled by medicine. Chest, back, or muscle pain. People around you feel you are not acting correctly or are confused. Shortness of breath or difficulty breathing. Dizziness and fainting. You get a rash or develop hives. You have a decrease in urine output. Your urine turns a dark color or changes to pink, red, or brown. Any of the following symptoms occur over the next 10 days: You have a temperature by mouth above 102 F (38.9 C), not controlled by medicine. Shortness of breath. Weakness after normal activity. The white part of the eye turns yellow (jaundice). You have a decrease in the amount of urine or are urinating less often. Your urine turns a dark color or changes to pink, red, or brown. Document Released: 10/12/2000 Document Revised: 01/07/2012 Document Reviewed: 05/31/2008 [facility] Patient Information 2014 [place],  [place].  _______________________________________________________________________

## 2021-06-02 NOTE — Progress Notes (Addendum)
COVID Vaccine Completed: No Date COVID Vaccine completed: Has received booster: COVID vaccine manufacturer: [employer]   Date of COVID positive in last 90 days: No  PCP - Philippa Chester, MD Cardiologist - Lauree Chandler, MD  Chest x-ray -  chest CT 08/17/20 Epic EKG - 09/12/20 Epic Stress Test - 2018 ECHO - pt states yes, unsure of when Cardiac Cath - N/a Pacemaker/ICD device last checked: N/a Spinal Cord Stimulator: N/a  Sleep Study - yes positive sleep apnea CPAP - every night  Fasting Blood Sugar - 130-170 Checks Blood Sugar ___1__ times a day  Blood Thinner Instructions: Aspirin Instructions: ASA 81, no instructions given Last Dose:  Activity level: Can go up a flight of stairs and perform activities of daily living without stopping and without symptoms of chest pain or shortness of breath.       Anesthesia review: OSA, HTN, DM  Patient denies shortness of breath, fever, cough and chest pain at PAT appointment   Patient verbalized understanding of instructions that were given to them at the PAT appointment. Patient was also instructed that they will need to review over the PAT instructions again at home before surgery.

## 2021-06-05 ENCOUNTER — Other Ambulatory Visit: Payer: Self-pay

## 2021-06-05 ENCOUNTER — Encounter (HOSPITAL_COMMUNITY): Payer: Self-pay

## 2021-06-05 ENCOUNTER — Encounter (HOSPITAL_COMMUNITY)
Admission: RE | Admit: 2021-06-05 | Discharge: 2021-06-05 | Disposition: A | Discharge status: Home / Self Care | Payer: BC Managed Care – PPO | Source: Ambulatory Visit | Attending: Surgery | Admitting: Surgery

## 2021-06-05 DIAGNOSIS — Z01812 Encounter for preprocedural laboratory examination: Secondary | ICD-10-CM | POA: Diagnosis not present

## 2021-06-05 LAB — COMPREHENSIVE METABOLIC PANEL
ALT: 58 U/L — ABNORMAL HIGH (ref 0–44)
AST: 35 U/L (ref 15–41)
Albumin: 4.4 g/dL (ref 3.5–5.0)
Alkaline Phosphatase: 45 U/L (ref 38–126)
Anion gap: 8 (ref 5–15)
BUN: 26 mg/dL — ABNORMAL HIGH (ref 6–20)
CO2: 27 mmol/L (ref 22–32)
Calcium: 8.8 mg/dL — ABNORMAL LOW (ref 8.9–10.3)
Chloride: 99 mmol/L (ref 98–111)
Creatinine, Ser: 0.99 mg/dL (ref 0.61–1.24)
GFR, Estimated: >60 mL/min (ref >60)
Glucose, Bld: 149 mg/dL — ABNORMAL HIGH (ref 70–99)
Potassium: 4.1 mmol/L (ref 3.5–5.1)
Sodium: 134 mmol/L — ABNORMAL LOW (ref 135–145)
Total Bilirubin: 1 mg/dL (ref 0.3–1.2)
Total Protein: 7.5 g/dL (ref 6.5–8.1)

## 2021-06-05 LAB — CBC WITH DIFFERENTIAL/PLATELET
Abs Immature Granulocytes: 0.01 10*3/uL (ref 0.00–0.07)
Basophils Absolute: 0 10*3/uL (ref 0.0–0.1)
Basophils Relative: 0 %
Eosinophils Absolute: 0.1 10*3/uL (ref 0.0–0.5)
Eosinophils Relative: 3 %
HCT: 45.9 % (ref 39.0–52.0)
Hemoglobin: 15.8 g/dL (ref 13.0–17.0)
Immature Granulocytes: 0 %
Lymphocytes Relative: 26 %
Lymphs Abs: 1.4 10*3/uL (ref 0.7–4.0)
MCH: 32.3 pg (ref 26.0–34.0)
MCHC: 34.4 g/dL (ref 30.0–36.0)
MCV: 93.9 fL (ref 80.0–100.0)
Monocytes Absolute: 0.3 10*3/uL (ref 0.1–1.0)
Monocytes Relative: 6 %
Neutro Abs: 3.3 10*3/uL (ref 1.7–7.7)
Neutrophils Relative %: 65 %
Platelets: 176 10*3/uL (ref 150–400)
RBC: 4.89 MIL/uL (ref 4.22–5.81)
RDW: 12.5 % (ref 11.5–15.5)
WBC: 5.2 10*3/uL (ref 4.0–10.5)
nRBC: 0 % (ref 0.0–0.2)

## 2021-06-05 LAB — GLUCOSE, CAPILLARY: Glucose-Capillary: 157 mg/dL — ABNORMAL HIGH (ref 70–99)

## 2021-06-08 NOTE — H&P (Signed)
Chief Complaint:  morbid obesity and diabetes  History of Present Illness:  Andrew [name]. is an 55 y.o. male who was seen in the office 8/11 for his preop visit for his upcoming roux en Y gastric bypass.  He has no questions and is eager to go on the two week diet.  BMI today is 49.  He works on Medical laboratory scientific officer.    Past Medical History:  Diagnosis Date   Epididymal cyst    left   Family history of premature CAD    History of exercise intolerance    06-03-2015--  dr Angelena Form--  no ischemia or chest pain and no arrhythmias   Hyperlipidemia    Hypertension    cardiologist-  dr Angelena Form   Left posterior fascicular block    Obesity, Class III, BMI 40-49.9 (morbid obesity) ([place])    Right hydrocele    Wears glasses     Past Surgical History:  Procedure Laterality Date   CARDIOVASCULAR STRESS TEST  04/23/2011   normal nuclear study w/ no ischemia (no change from previous exam)/  normal LV function and wall motion , ef 64%   COLONOSCOPY  2012   HERNIA REPAIR  infant   HYDROCELE EXCISION Right 09/17/2016   Procedure: HYDROCELECTOMY ADULT;  Surgeon: Kathie Rhodes, MD;  Location[facility];  Service: Urology;  Laterality: Right;   WISDOM TOOTH EXTRACTION  2002    No current facility-administered medications for this encounter.   Current Outpatient Medications  Medication Sig Dispense Refill   aspirin 81 MG tablet Take 81 mg by mouth daily.     ezetimibe (ZETIA) 10 MG tablet Take 1 tablet (10 mg total) by mouth daily. 90 tablet 3   lisinopril-hydrochlorothiazide (ZESTORETIC) 20-12.5 MG tablet TAKE 1 TABLET BY MOUTH DAILY 90 tablet 3   metFORMIN (GLUCOPHAGE) 500 MG tablet TAKE 1 TABLET BY MOUTH TWICE DAILY WITH A MEAL (Patient taking differently: Take 500 mg by mouth 2 (two) times daily with a meal.) 180 tablet 3   Multiple Vitamin (MULTIVITAMIN) tablet Take 1 tablet by mouth daily.     oxymetazoline (VICKS SINEX MOISTURIZING) 0.05 % nasal spray Place 1 spray into both  nostrils at bedtime.     Crestor [rosuvastatin calcium] and Testosterone Family History  Problem Relation Age of Onset   Heart attack Mother    COPD Mother    Heart disease Mother    Hyperlipidemia Mother    Heart attack Father    Heart disease Father    Hyperlipidemia Father    Hypertension Other    Social History:   reports that he has never smoked. He has never used smokeless tobacco. He reports current alcohol use of about 2.0 - 3.0 standard drinks per week. He reports that he does not use drugs.   REVIEW OF SYSTEMS : Negative except for see problem list  Physical Exam:   There were no vitals taken for this visit. There is no height or weight on file to calculate BMI.  Gen:  WDWN WM NAD  Neurological: Alert and oriented to person, place, and time. Motor and sensory function is grossly intact  Head: Normocephalic and atraumatic.  Eyes: Conjunctivae are normal. Pupils are equal, round, and reactive to light. No scleral icterus.  Neck: Normal range of motion. Neck supple. No tracheal deviation or thyromegaly present.  Cardiovascular:  SR without murmurs or gallops.  No carotid bruits Breast:  not examined Respiratory: Effort normal.  No respiratory distress. No chest wall tenderness.  Breath sounds normal.  No wheezes, rales or rhonchi.  Abdomen:  small upper diastasis recti GU:  not examined Musculoskeletal: Normal range of motion. Extremities are nontender. No cyanosis, edema or clubbing noted Lymphadenopathy: No cervical, preauricular, postauricular or axillary adenopathy is present Skin: Skin is warm and dry. No rash noted. No diaphoresis. No erythema. No pallor. Pscyh: Normal mood and affect. Behavior is normal. Judgment and thought content normal.   LABORATORY RESULTS: No results found for this or any previous visit (from the past 48 hour(s)).   RADIOLOGY RESULTS: No results found.  Problem List: Patient Active Problem List   Diagnosis Date Noted   Dyslipidemia  associated with type 2 diabetes mellitus ([place]) 05/08/2021   Statin intolerance 08/22/2020   High triglycerides 11/30/2019   Prediabetes 11/28/2018   OSA on CPAP 05/17/2017   Morbid obesity ([place]) 05/17/2017   Hydrocele, right 11/17/2015   Hypertension associated with diabetes ([place]) 05/13/2012   Hypogonadism male 05/13/2012    Assessment & Plan: DM on metformin for roux en Y gastric bypass.      Matt B. Hassell Done, MD, [facility] [phone] beeper [phone]  06/08/2021 9:53 AM

## 2021-06-19 ENCOUNTER — Telehealth: Payer: Self-pay | Admitting: Emergency Medicine

## 2021-06-19 NOTE — Telephone Encounter (Signed)
Called and left vm with  provider's recommendations.

## 2021-06-19 NOTE — Telephone Encounter (Signed)
Must stop aspirin 7 days before surgery.  Thanks.

## 2021-06-19 NOTE — Telephone Encounter (Signed)
Patient is having a bariatric surgery on Tuesday, 06/27/2021. The office advised him to call PCP to find out if he needs to stop any medications prior to his procedure.  Requesting a callback.  Best callback# [phone].

## 2021-06-23 ENCOUNTER — Other Ambulatory Visit: Payer: Self-pay | Admitting: Surgery

## 2021-06-23 LAB — SARS CORONAVIRUS 2 (TAT 6-24 HRS): SARS Coronavirus 2: NEGATIVE

## 2021-06-26 MED ORDER — BUPIVACAINE LIPOSOME 1.3 % IJ SUSP
20.0000 mL | Freq: Once | INTRAMUSCULAR | Status: AC
  Filled 2021-06-26: qty 20

## 2021-06-27 ENCOUNTER — Inpatient Hospital Stay (HOSPITAL_COMMUNITY): Payer: BC Managed Care – PPO | Admitting: Certified Registered Nurse Anesthetist

## 2021-06-27 ENCOUNTER — Other Ambulatory Visit: Payer: Self-pay

## 2021-06-27 ENCOUNTER — Inpatient Hospital Stay (HOSPITAL_COMMUNITY): Payer: BC Managed Care – PPO | Admitting: Physician Assistant

## 2021-06-27 ENCOUNTER — Encounter (HOSPITAL_COMMUNITY)
Admission: RE | Disposition: A | Discharge status: Home / Self Care | Payer: Self-pay | Source: Home / Self Care | Attending: Surgery

## 2021-06-27 ENCOUNTER — Encounter (HOSPITAL_COMMUNITY): Payer: Self-pay | Admitting: Surgery

## 2021-06-27 ENCOUNTER — Inpatient Hospital Stay (HOSPITAL_COMMUNITY)
Admission: RE | Admit: 2021-06-27 | Discharge: 2021-06-28 | DRG: 621 | Disposition: A | Discharge status: Home / Self Care | Payer: BC Managed Care – PPO | Attending: Surgery | Admitting: Surgery

## 2021-06-27 DIAGNOSIS — I152 Hypertension secondary to endocrine disorders: Secondary | ICD-10-CM | POA: Diagnosis present

## 2021-06-27 DIAGNOSIS — G4733 Obstructive sleep apnea (adult) (pediatric): Secondary | ICD-10-CM | POA: Diagnosis present

## 2021-06-27 DIAGNOSIS — Z79899 Other long term (current) drug therapy: Secondary | ICD-10-CM | POA: Diagnosis not present

## 2021-06-27 DIAGNOSIS — E1169 Type 2 diabetes mellitus with other specified complication: Secondary | ICD-10-CM | POA: Diagnosis not present

## 2021-06-27 DIAGNOSIS — Z825 Family history of asthma and other chronic lower respiratory diseases: Secondary | ICD-10-CM | POA: Diagnosis not present

## 2021-06-27 DIAGNOSIS — I10 Essential (primary) hypertension: Secondary | ICD-10-CM | POA: Diagnosis not present

## 2021-06-27 DIAGNOSIS — Z6841 Body Mass Index (BMI) 40.0 and over, adult: Secondary | ICD-10-CM | POA: Diagnosis not present

## 2021-06-27 DIAGNOSIS — Z7984 Long term (current) use of oral hypoglycemic drugs: Secondary | ICD-10-CM

## 2021-06-27 DIAGNOSIS — Z8249 Family history of ischemic heart disease and other diseases of the circulatory system: Secondary | ICD-10-CM | POA: Diagnosis not present

## 2021-06-27 DIAGNOSIS — Z7982 Long term (current) use of aspirin: Secondary | ICD-10-CM | POA: Diagnosis not present

## 2021-06-27 DIAGNOSIS — Z83438 Family history of other disorder of lipoprotein metabolism and other lipidemia: Secondary | ICD-10-CM | POA: Diagnosis not present

## 2021-06-27 DIAGNOSIS — E781 Pure hyperglyceridemia: Secondary | ICD-10-CM | POA: Diagnosis not present

## 2021-06-27 DIAGNOSIS — Z9989 Dependence on other enabling machines and devices: Secondary | ICD-10-CM | POA: Diagnosis not present

## 2021-06-27 DIAGNOSIS — Z9884 Bariatric surgery status: Secondary | ICD-10-CM

## 2021-06-27 HISTORY — PX: GASTRIC ROUX-EN-Y: SHX5262

## 2021-06-27 LAB — CBC
HCT: 41.8 % (ref 39.0–52.0)
Hemoglobin: 14.5 g/dL (ref 13.0–17.0)
MCH: 32.6 pg (ref 26.0–34.0)
MCHC: 34.7 g/dL (ref 30.0–36.0)
MCV: 93.9 fL (ref 80.0–100.0)
Platelets: 180 10*3/uL (ref 150–400)
RBC: 4.45 MIL/uL (ref 4.22–5.81)
RDW: 12.4 % (ref 11.5–15.5)
WBC: 9.5 10*3/uL (ref 4.0–10.5)
nRBC: 0 % (ref 0.0–0.2)

## 2021-06-27 LAB — HEMOGLOBIN AND HEMATOCRIT, BLOOD
HCT: 42.9 % (ref 39.0–52.0)
Hemoglobin: 14.8 g/dL (ref 13.0–17.0)

## 2021-06-27 LAB — TYPE AND SCREEN
ABO/RH(D): A POS
Antibody Screen: NEGATIVE

## 2021-06-27 LAB — ABO/RH: ABO/RH(D): A POS

## 2021-06-27 LAB — CREATININE, SERUM
Creatinine, Ser: 0.97 mg/dL (ref 0.61–1.24)
GFR, Estimated: >60 mL/min (ref >60)

## 2021-06-27 LAB — GLUCOSE, CAPILLARY
Glucose-Capillary: 135 mg/dL — ABNORMAL HIGH (ref 70–99)
Glucose-Capillary: 176 mg/dL — ABNORMAL HIGH (ref 70–99)
Glucose-Capillary: 167 mg/dL — ABNORMAL HIGH (ref 70–99)
Glucose-Capillary: 129 mg/dL — ABNORMAL HIGH (ref 70–99)

## 2021-06-27 SURGERY — LAPAROSCOPIC ROUX-EN-Y GASTRIC BYPASS WITH UPPER ENDOSCOPY
Anesthesia: General | Site: Abdomen

## 2021-06-27 MED ORDER — PROPOFOL 10 MG/ML IV BOLUS
INTRAVENOUS | Status: DC | PRN
  Administered 2021-06-27: 20 mg via INTRAVENOUS
  Administered 2021-06-27: 180 mg via INTRAVENOUS

## 2021-06-27 MED ORDER — ROCURONIUM BROMIDE 10 MG/ML (PF) SYRINGE
PREFILLED_SYRINGE | INTRAVENOUS | Status: AC
  Filled 2021-06-27: qty 10

## 2021-06-27 MED ORDER — SCOPOLAMINE 1 MG/3DAYS TD PT72
1.0000 | MEDICATED_PATCH | TRANSDERMAL | Status: DC
  Administered 2021-06-27: 1.5 mg via TRANSDERMAL
  Filled 2021-06-27: qty 1

## 2021-06-27 MED ORDER — PROPOFOL 10 MG/ML IV BOLUS
INTRAVENOUS | Status: AC
  Filled 2021-06-27: qty 20

## 2021-06-27 MED ORDER — HEPARIN SODIUM (PORCINE) 5000 UNIT/ML IJ SOLN
5000.0000 [IU] | Freq: Three times a day (TID) | INTRAMUSCULAR | Status: DC
  Administered 2021-06-27 – 2021-06-28 (×3): 5000 [IU] via SUBCUTANEOUS
  Filled 2021-06-27 (×4): qty 1

## 2021-06-27 MED ORDER — ENSURE MAX PROTEIN PO LIQD
2.0000 [oz_av] | ORAL | Status: DC
  Administered 2021-06-28 (×3): 2 [oz_av] via ORAL

## 2021-06-27 MED ORDER — MORPHINE SULFATE (PF) 4 MG/ML IV SOLN: 1.0000 mg | INTRAVENOUS | Status: DC | PRN

## 2021-06-27 MED ORDER — LIDOCAINE 2% (20 MG/ML) 5 ML SYRINGE
INTRAMUSCULAR | Status: DC | PRN
  Administered 2021-06-27: 100 mg via INTRAVENOUS

## 2021-06-27 MED ORDER — MIDAZOLAM HCL 5 MG/5ML IJ SOLN
INTRAMUSCULAR | Status: DC | PRN
  Administered 2021-06-27: 2 mg via INTRAVENOUS

## 2021-06-27 MED ORDER — FENTANYL CITRATE (PF) 250 MCG/5ML IJ SOLN
INTRAMUSCULAR | Status: AC
  Filled 2021-06-27: qty 5

## 2021-06-27 MED ORDER — HEPARIN SODIUM (PORCINE) 5000 UNIT/ML IJ SOLN
5000.0000 [IU] | INTRAMUSCULAR | Status: AC
  Administered 2021-06-27: 5000 [IU] via SUBCUTANEOUS
  Filled 2021-06-27: qty 1

## 2021-06-27 MED ORDER — EPHEDRINE SULFATE-NACL 50-0.9 MG/10ML-% IV SOSY
PREFILLED_SYRINGE | INTRAVENOUS | Status: DC | PRN
  Administered 2021-06-27: 5 mg via INTRAVENOUS

## 2021-06-27 MED ORDER — PANTOPRAZOLE SODIUM 40 MG IV SOLR
40.0000 mg | Freq: Every day | INTRAVENOUS | Status: DC
  Administered 2021-06-27: 40 mg via INTRAVENOUS
  Filled 2021-06-27: qty 40

## 2021-06-27 MED ORDER — DEXAMETHASONE SODIUM PHOSPHATE 10 MG/ML IJ SOLN
INTRAMUSCULAR | Status: AC
  Filled 2021-06-27: qty 1

## 2021-06-27 MED ORDER — FENTANYL CITRATE PF 50 MCG/ML IJ SOSY: 25.0000 ug | PREFILLED_SYRINGE | INTRAMUSCULAR | Status: DC | PRN

## 2021-06-27 MED ORDER — GABAPENTIN 300 MG PO CAPS
300.0000 mg | ORAL_CAPSULE | ORAL | Status: AC
  Administered 2021-06-27: 300 mg via ORAL
  Filled 2021-06-27: qty 1

## 2021-06-27 MED ORDER — EPHEDRINE 5 MG/ML INJ
INTRAVENOUS | Status: AC
  Filled 2021-06-27: qty 5

## 2021-06-27 MED ORDER — FENTANYL CITRATE (PF) 250 MCG/5ML IJ SOLN
INTRAMUSCULAR | Status: DC | PRN
  Administered 2021-06-27: 50 ug via INTRAVENOUS
  Administered 2021-06-27: 150 ug via INTRAVENOUS
  Administered 2021-06-27 (×2): 50 ug via INTRAVENOUS

## 2021-06-27 MED ORDER — ONDANSETRON HCL 4 MG/2ML IJ SOLN: 4.0000 mg | INTRAMUSCULAR | Status: DC | PRN

## 2021-06-27 MED ORDER — ORAL CARE MOUTH RINSE: 15.0000 mL | Freq: Once | OROMUCOSAL | Status: AC

## 2021-06-27 MED ORDER — CHLORHEXIDINE GLUCONATE 0.12 % MT SOLN
15.0000 mL | Freq: Once | OROMUCOSAL | Status: AC
  Administered 2021-06-27: 15 mL via OROMUCOSAL

## 2021-06-27 MED ORDER — SODIUM CHLORIDE 0.9 % IV SOLN
2.0000 g | INTRAVENOUS | Status: AC
  Administered 2021-06-27: 2 g via INTRAVENOUS
  Filled 2021-06-27: qty 2

## 2021-06-27 MED ORDER — GLYCOPYRROLATE 0.2 MG/ML IJ SOLN
INTRAMUSCULAR | Status: DC | PRN
  Administered 2021-06-27: .2 mg via INTRAVENOUS

## 2021-06-27 MED ORDER — LIDOCAINE 2% (20 MG/ML) 5 ML SYRINGE
INTRAMUSCULAR | Status: DC | PRN
  Administered 2021-06-27: 1.5 mg/kg/h via INTRAVENOUS

## 2021-06-27 MED ORDER — APREPITANT 40 MG PO CAPS
40.0000 mg | ORAL_CAPSULE | ORAL | Status: AC
  Administered 2021-06-27: 40 mg via ORAL
  Filled 2021-06-27: qty 1

## 2021-06-27 MED ORDER — SUGAMMADEX SODIUM 500 MG/5ML IV SOLN
INTRAVENOUS | Status: DC | PRN
  Administered 2021-06-27: 300 mg via INTRAVENOUS

## 2021-06-27 MED ORDER — CHLORHEXIDINE GLUCONATE CLOTH 2 % EX PADS: 6.0000 | MEDICATED_PAD | Freq: Once | CUTANEOUS | Status: AC

## 2021-06-27 MED ORDER — ACETAMINOPHEN 500 MG PO TABS
1000.0000 mg | ORAL_TABLET | Freq: Three times a day (TID) | ORAL | Status: DC
  Administered 2021-06-27: 1000 mg via ORAL
  Filled 2021-06-27: qty 2

## 2021-06-27 MED ORDER — ACETAMINOPHEN 500 MG PO TABS
1000.0000 mg | ORAL_TABLET | ORAL | Status: AC
  Administered 2021-06-27: 1000 mg via ORAL
  Filled 2021-06-27: qty 2

## 2021-06-27 MED ORDER — BUPIVACAINE LIPOSOME 1.3 % IJ SUSP
INTRAMUSCULAR | Status: DC | PRN
  Administered 2021-06-27: 20 mL

## 2021-06-27 MED ORDER — PROMETHAZINE HCL 25 MG/ML IJ SOLN: 6.2500 mg | INTRAMUSCULAR | Status: DC | PRN

## 2021-06-27 MED ORDER — ROCURONIUM BROMIDE 10 MG/ML (PF) SYRINGE
PREFILLED_SYRINGE | INTRAVENOUS | Status: DC | PRN
  Administered 2021-06-27: 30 mg via INTRAVENOUS
  Administered 2021-06-27: 70 mg via INTRAVENOUS
  Administered 2021-06-27: 20 mg via INTRAVENOUS
  Administered 2021-06-27: 30 mg via INTRAVENOUS
  Administered 2021-06-27: 10 mg via INTRAVENOUS

## 2021-06-27 MED ORDER — KCL IN DEXTROSE-NACL 20-5-0.45 MEQ/L-%-% IV SOLN
INTRAVENOUS | Status: DC
  Filled 2021-06-27 (×3): qty 1000

## 2021-06-27 MED ORDER — ACETAMINOPHEN 160 MG/5ML PO SOLN: 1000.0000 mg | Freq: Three times a day (TID) | ORAL | Status: DC

## 2021-06-27 MED ORDER — LIDOCAINE HCL 2 % IJ SOLN
INTRAMUSCULAR | Status: AC
  Filled 2021-06-27: qty 20

## 2021-06-27 MED ORDER — LACTATED RINGERS IR SOLN
Status: DC | PRN
  Administered 2021-06-27: 1000 mL

## 2021-06-27 MED ORDER — INSULIN ASPART 100 UNIT/ML IJ SOLN
0.0000 [IU] | INTRAMUSCULAR | Status: DC
  Administered 2021-06-27: 4 [IU] via SUBCUTANEOUS

## 2021-06-27 MED ORDER — TISSEEL VH 10 ML EX KIT
PACK | CUTANEOUS | Status: AC
  Filled 2021-06-27: qty 1

## 2021-06-27 MED ORDER — OXYCODONE HCL 5 MG/5ML PO SOLN
5.0000 mg | Freq: Four times a day (QID) | ORAL | Status: DC | PRN
Start: 2021-06-27 — End: 2021-06-28

## 2021-06-27 MED ORDER — LIDOCAINE 2% (20 MG/ML) 5 ML SYRINGE
INTRAMUSCULAR | Status: AC
  Filled 2021-06-27: qty 5

## 2021-06-27 MED ORDER — SUGAMMADEX SODIUM 500 MG/5ML IV SOLN
INTRAVENOUS | Status: AC
  Filled 2021-06-27: qty 5

## 2021-06-27 MED ORDER — SODIUM CHLORIDE (PF) 0.9 % IJ SOLN
INTRAMUSCULAR | Status: DC | PRN
  Administered 2021-06-27: 20 mL

## 2021-06-27 MED ORDER — SODIUM CHLORIDE (PF) 0.9 % IJ SOLN
INTRAMUSCULAR | Status: AC
  Filled 2021-06-27: qty 20

## 2021-06-27 MED ORDER — KETAMINE HCL 10 MG/ML IJ SOLN
INTRAMUSCULAR | Status: DC | PRN
  Administered 2021-06-27: 40 mg via INTRAVENOUS

## 2021-06-27 MED ORDER — ONDANSETRON HCL 4 MG/2ML IJ SOLN
INTRAMUSCULAR | Status: AC
  Filled 2021-06-27: qty 2

## 2021-06-27 MED ORDER — TISSEEL VH 10 ML EX KIT
PACK | CUTANEOUS | Status: DC | PRN
  Administered 2021-06-27: 2

## 2021-06-27 MED ORDER — GLYCOPYRROLATE 0.2 MG/ML IJ SOLN
INTRAMUSCULAR | Status: AC
  Filled 2021-06-27: qty 1

## 2021-06-27 MED ORDER — MIDAZOLAM HCL 2 MG/2ML IJ SOLN
INTRAMUSCULAR | Status: AC
  Filled 2021-06-27: qty 2

## 2021-06-27 MED ORDER — KETAMINE HCL 10 MG/ML IJ SOLN
INTRAMUSCULAR | Status: AC
  Filled 2021-06-27: qty 1

## 2021-06-27 MED ORDER — ONDANSETRON HCL 4 MG/2ML IJ SOLN
INTRAMUSCULAR | Status: DC | PRN
  Administered 2021-06-27: 4 mg via INTRAVENOUS

## 2021-06-27 MED ORDER — LACTATED RINGERS IV SOLN
INTRAVENOUS | Status: DC
  Administered 2021-06-27: 1000 mL via INTRAVENOUS

## 2021-06-27 MED ORDER — MORPHINE SULFATE (PF) 2 MG/ML IV SOLN
1.0000 mg | INTRAVENOUS | Status: DC | PRN
  Filled 2021-06-27: qty 1

## 2021-06-27 MED ORDER — CHLORHEXIDINE GLUCONATE CLOTH 2 % EX PADS: 6.0000 | MEDICATED_PAD | Freq: Once | CUTANEOUS | Status: DC

## 2021-06-27 MED ORDER — DEXAMETHASONE SODIUM PHOSPHATE 10 MG/ML IJ SOLN
INTRAMUSCULAR | Status: DC | PRN
  Administered 2021-06-27: 6 mg via INTRAVENOUS

## 2021-06-27 SURGICAL SUPPLY — 81 items
ADH SKN CLS APL DERMABOND .7 (GAUZE/BANDAGES/DRESSINGS) ×1
APL SRG 32X5 SNPLK LF DISP (MISCELLANEOUS) ×1
APL SWBSTK 6 STRL LF DISP (MISCELLANEOUS) ×2
APPLICATOR COTTON TIP 6 STRL (MISCELLANEOUS) ×2 IMPLANT
APPLICATOR COTTON TIP 6IN STRL (MISCELLANEOUS) ×6
APPLIER CLIP ROT 10 11.4 M/L (STAPLE)
APPLIER CLIP ROT 13.4 12 LRG (CLIP)
APR CLP LRG 13.4X12 ROT 20 MLT (CLIP)
APR CLP MED LRG 11.4X10 (STAPLE)
BAG COUNTER SPONGE SURGICOUNT (BAG) IMPLANT
BAG SPNG CNTER NS LX DISP (BAG)
BAG SURGICOUNT SPONGE COUNTING (BAG)
BLADE SURG 15 STRL LF DISP TIS (BLADE) ×1 IMPLANT
BLADE SURG 15 STRL SS (BLADE) ×3
CABLE HIGH FREQUENCY MONO STRZ (ELECTRODE) IMPLANT
CLIP APPLIE ROT 10 11.4 M/L (STAPLE) IMPLANT
CLIP APPLIE ROT 13.4 12 LRG (CLIP) IMPLANT
CLIP SUT LAPRA TY ABSORB (SUTURE) ×5 IMPLANT
DERMABOND ADVANCED (GAUZE/BANDAGES/DRESSINGS) ×2
DERMABOND ADVANCED .7 DNX12 (GAUZE/BANDAGES/DRESSINGS) IMPLANT
DEVICE SUT QUICK LOAD TK 5 (STAPLE) IMPLANT
DEVICE SUT TI-KNOT TK 5X26 (MISCELLANEOUS) IMPLANT
DEVICE SUTURE ENDOST 10MM (ENDOMECHANICALS) ×3 IMPLANT
DEVICE TI KNOT TK5 (MISCELLANEOUS)
DISSECTOR BLUNT TIP ENDO 5MM (MISCELLANEOUS) IMPLANT
DRAIN PENROSE 0.25X18 (DRAIN) ×3 IMPLANT
GAUZE 4X4 16PLY ~~LOC~~+RFID DBL (SPONGE) ×3 IMPLANT
GLOVE SURG ENC TEXT LTX SZ8 (GLOVE) ×3 IMPLANT
GOWN STRL REUS W/TWL XL LVL3 (GOWN DISPOSABLE) ×6 IMPLANT
HANDLE STAPLE EGIA 4 XL (STAPLE) ×3 IMPLANT
KIT BASIN OR (CUSTOM PROCEDURE TRAY) ×3 IMPLANT
KIT GASTRIC LAVAGE 34FR ADT (SET/KITS/TRAYS/PACK) ×3 IMPLANT
KIT TURNOVER KIT A (KITS) ×3 IMPLANT
MARKER SKIN DUAL TIP RULER LAB (MISCELLANEOUS) ×3 IMPLANT
MAT PREVALON FULL STRYKER (MISCELLANEOUS) ×3 IMPLANT
NDL SPNL 22GX3.5 QUINCKE BK (NEEDLE) ×1 IMPLANT
NEEDLE SPNL 22GX3.5 QUINCKE BK (NEEDLE) ×3 IMPLANT
PACK CARDIOVASCULAR III (CUSTOM PROCEDURE TRAY) ×3 IMPLANT
PENCIL SMOKE EVACUATOR (MISCELLANEOUS) IMPLANT
QUICK LOAD TK 5 (STAPLE)
RELOAD EGIA 45 MED/THCK PURPLE (STAPLE) ×2 IMPLANT
RELOAD EGIA 45 TAN VASC (STAPLE) IMPLANT
RELOAD EGIA 60 MED/THCK PURPLE (STAPLE) ×6 IMPLANT
RELOAD EGIA 60 TAN VASC (STAPLE) ×4 IMPLANT
RELOAD ENDO STITCH 2.0 (ENDOMECHANICALS) ×33
RELOAD STAPLE 45 PURP MED/THCK (STAPLE) IMPLANT
RELOAD STAPLE 60 MED/THCK ART (STAPLE) IMPLANT
RELOAD SUT SNGL STCH ABSRB 2-0 (ENDOMECHANICALS) ×5 IMPLANT
RELOAD SUT SNGL STCH BLK 2-0 (ENDOMECHANICALS) ×4 IMPLANT
RELOAD TRI 45 ART MED THCK PUR (STAPLE) ×6 IMPLANT
RELOAD TRI 60 ART MED THCK PUR (STAPLE) ×2 IMPLANT
SCISSORS LAP 5X45 EPIX DISP (ENDOMECHANICALS) ×3 IMPLANT
SEALANT SURGICAL APPL DUAL CAN (MISCELLANEOUS) ×3 IMPLANT
SET IRRIG TUBING LAPAROSCOPIC (IRRIGATION / IRRIGATOR) ×3 IMPLANT
SET TUBE SMOKE EVAC HIGH FLOW (TUBING) ×3 IMPLANT
SHEARS HARMONIC ACE PLUS 45CM (MISCELLANEOUS) ×3 IMPLANT
SLEEVE ADV FIXATION 12X100MM (TROCAR) ×6 IMPLANT
SLEEVE ADV FIXATION 5X100MM (TROCAR) IMPLANT
SOL ANTI FOG 6CC (MISCELLANEOUS) ×1 IMPLANT
SOLUTION ANTI FOG 6CC (MISCELLANEOUS) ×2
STAPLER VISISTAT 35W (STAPLE) IMPLANT
SUT MNCRL AB 4-0 PS2 18 (SUTURE) ×9 IMPLANT
SUT RELOAD ENDO STITCH 2 48X1 (ENDOMECHANICALS) ×7
SUT RELOAD ENDO STITCH 2.0 (ENDOMECHANICALS) ×4
SUT SURGIDAC NAB ES-9 0 48 120 (SUTURE) IMPLANT
SUT VIC AB 2-0 SH 27 (SUTURE) ×3
SUT VIC AB 2-0 SH 27X BRD (SUTURE) ×1 IMPLANT
SUTURE RELOAD END STTCH 2 48X1 (ENDOMECHANICALS) ×7 IMPLANT
SUTURE RELOAD ENDO STITCH 2.0 (ENDOMECHANICALS) ×4 IMPLANT
SYR 10ML ECCENTRIC (SYRINGE) ×3 IMPLANT
SYR 20ML LL LF (SYRINGE) ×6 IMPLANT
SYR 50ML LL SCALE MARK (SYRINGE) ×3 IMPLANT
TOWEL OR 17X26 10 PK STRL BLUE (TOWEL DISPOSABLE) ×3 IMPLANT
TOWEL OR NON WOVEN STRL DISP B (DISPOSABLE) ×3 IMPLANT
TRAY FOLEY MTR SLVR 16FR STAT (SET/KITS/TRAYS/PACK) ×3 IMPLANT
TROCAR ADV FIXATION 12X100MM (TROCAR) ×3 IMPLANT
TROCAR ADV FIXATION 5X100MM (TROCAR) ×3 IMPLANT
TROCAR BLADELESS OPT 5 100 (ENDOMECHANICALS) ×3 IMPLANT
TROCAR XCEL 12X100 BLDLESS (ENDOMECHANICALS) ×3 IMPLANT
TUBING CONNECTING 10 (TUBING) ×4 IMPLANT
TUBING CONNECTING 10' (TUBING) ×2

## 2021-06-27 NOTE — Anesthesia Postprocedure Evaluation (Signed)
Anesthesia Post Note  Patient: Andrew [name].  Procedure(s) Performed: LAPAROSCOPIC ROUX-EN-Y GASTRIC BYPASS WITH UPPER ENDOSCOPY (Abdomen)     Patient location during evaluation: PACU Anesthesia Type: General Level of consciousness: awake and alert Pain management: pain level controlled Vital Signs Assessment: post-procedure vital signs reviewed and stable Respiratory status: spontaneous breathing, nonlabored ventilation, respiratory function stable and patient connected to nasal cannula oxygen Cardiovascular status: blood pressure returned to baseline and stable Postop Assessment: no apparent nausea or vomiting Anesthetic complications: no   No notable events documented.  Last Vitals:  Vitals:   06/27/21 1602 06/27/21 1706  BP: 136/80 136/89  Pulse: 78 79  Resp: 15 15  Temp:  36.5 C  SpO2: 93% 92%    Last Pain:  Vitals:   06/27/21 1706  TempSrc: Oral  PainSc:                  Catalina Gravel

## 2021-06-27 NOTE — Interval H&P Note (Signed)
History and Physical Interval Note:  06/27/2021 7:25 AM  Andrew [name].  has presented today for surgery, with the diagnosis of MORBID OBESITY.  The various methods of treatment have been discussed with the patient and family. After consideration of risks, benefits and other options for treatment, the patient has consented to  Procedure(s): LAPAROSCOPIC ROUX-EN-Y GASTRIC BYPASS WITH UPPER ENDOSCOPY (N/A) as a surgical intervention.  The patient's history has been reviewed, patient examined, no change in status, stable for surgery.  I have reviewed the patient's chart and labs.  Questions were answered to the patient's satisfaction.     [name]

## 2021-06-27 NOTE — Op Note (Signed)
27 June 2021 Surgeon: Catalina Antigua B. Hassell Done, MD, FACS Asst:  Greer Pickerel, MD, FACS Anesthesia: General endotracheal Drains: None  Procedure: Laparoscopic Roux en Y gastric bypass with 50 cm BP limb and 100 cm Roux limb, antecolic, antegastric, candy cane to the left.  Closure of Peterson's defect. Upper endoscopy.   Description of Procedure:  The patient was taken to OR 1 at [facility] and given general anesthesia.  The abdomen was prepped with chloroprep and draped sterilely.  A time out was performed.    The operation began by identifying the ligament of Treitz. I measured 50 cm downstream and divided the bowel with a 6 cm Covidian stapler.  I sutured a Penrose drain along the Roux limb end.  I measured a 1 meter (100 cm) Roux limb and then placed the distal bowels to the BP limb side by side and performed a stapled jejunojejunostomy. The common defect was closed from either end with 4-0 Vicryl using the Endo Stitch. The mesenteric defect was closed with a running 2-0 silk using the Endo Stitch. Tisseel was applied to the suture line.  The omentum was divided with the harmonic scalpel.  The Nathanson retractor was inserted in the left lateral segment of liver was retracted. The foregut dissection ensued.  A 5 cm pouch along the lessor curvature was created with multiple firings of the Covidien purple loads with the first two without TRS.  When the pouch was created the roux Y anastomosis was created.    The Roux limb was then brought up with the candycane pointed left and a back row of sutures of 2-0 Vicryl were placed. I opened along the right side of each structure and inserted the 4.5 cm stapler to create the gastrojejunostomy. The common defect was closed from either end with 2-0 Vicryl and a second row was placed anterior to that the Ewald tube acting as a stent across the anastomosis. The Penrose drain was removed. Peterson's defect was closed with 2-0 silk.   Endoscopy was performed by Dr. Redmond Pulling and  revealed a patent anastomosis and no bubbles  The incisions were injected with Exparel as a TAP block at the beginning of the case  and were closed with 4-0 Monocryl and Dermabond  The patient was taken to the recovery room in satisfactory condition.  Matt B. Hassell Done, MD, FACS

## 2021-06-27 NOTE — Progress Notes (Signed)
PHARMACY CONSULT FOR:  Risk Assessment for Post-Discharge VTE Following Bariatric Surgery  Post-Discharge VTE Risk Assessment: This patient's probability of 30-day post-discharge VTE is increased due to the factors marked:  x Male    Age >/=60 years    BMI >/=50 kg/m2    CHF    Dyspnea at Rest    Paraplegia   x Non-gastric-band surgery    Operation Time >/=3 hr    Return to OR     Length of Stay >/= 3 d   Hx of VTE   Hypercoagulable condition   Significant venous stasis       Predicted probability of 30-day post-discharge VTE: 0.31%  Other patient-specific factors to consider: none  Recommendation for Discharge: No pharmacologic prophylaxis post-discharge  Andrew [name]. is a 55 y.o. male who underwent laparoscopic Roux-en-Y gastric bypass 06/27/2021   Allergies  Allergen Reactions   Crestor [Rosuvastatin Calcium] Other (See Comments)    Joint pain   Testosterone Rash    SEVERE RASH WITH ITCHING (TOPICAL AND INJECTION)    Patient Measurements: Height: '5\' 7"'$  (170.2 cm) Weight: (!) 139.9 kg (308 lb 6.4 oz) IBW/kg (Calculated) : 66.1 Body mass index is 48.3 kg/m.  Recent Labs    06/27/21 1226  WBC 9.5  HGB 14.5  HCT 41.8  PLT 180  CREATININE 0.97   Estimated Creatinine Clearance: 117.7 mL/min (by C-G formula based on SCr of 0.97 mg/dL).    Past Medical History:  Diagnosis Date   Epididymal cyst    left   Family history of premature CAD    History of exercise intolerance    06-03-2015--  dr Andrew [name]--  no ischemia or chest pain and no arrhythmias   Hyperlipidemia    Hypertension    cardiologist-  dr Andrew [name]   Left posterior fascicular block    Obesity, Class III, BMI 40-49.9 (morbid obesity) ([place])    Right hydrocele    Wears glasses      Medications Prior to Admission  Medication Sig Dispense Refill Last Dose   aspirin 81 MG tablet Take 81 mg by mouth daily.   06/20/2021   ezetimibe (ZETIA) 10 MG tablet Take 1 tablet (10 mg total) by  mouth daily. 90 tablet 3 06/26/2021   lisinopril-hydrochlorothiazide (ZESTORETIC) 20-12.5 MG tablet TAKE 1 TABLET BY MOUTH DAILY 90 tablet 3 06/26/2021   metFORMIN (GLUCOPHAGE) 500 MG tablet TAKE 1 TABLET BY MOUTH TWICE DAILY WITH A MEAL (Patient taking differently: Take 500 mg by mouth 2 (two) times daily with a meal.) 180 tablet 3 06/26/2021   Multiple Vitamin (MULTIVITAMIN) tablet Take 1 tablet by mouth daily.   Past Week   oxymetazoline (AFRIN) 0.05 % nasal spray Place 1 spray into both nostrils at bedtime.   Past Month   [name], Pharm.D [phone] 06/27/2021 3:01 PM

## 2021-06-27 NOTE — Anesthesia Procedure Notes (Signed)
Procedure Name: Intubation Date/Time: 06/27/2021 7:42 AM Performed by: Maxwell Caul, CRNA Pre-anesthesia Checklist: Patient identified, Emergency Drugs available, Suction available and Patient being monitored Patient Re-evaluated:Patient Re-evaluated prior to induction Oxygen Delivery Method: Circle system utilized Preoxygenation: Pre-oxygenation with 100% oxygen Induction Type: IV induction Ventilation: Mask ventilation without difficulty and Oral airway inserted - appropriate to patient size Laryngoscope Size: Mac and 4 Grade View: Grade II Tube type: Oral Tube size: 7.5 mm Number of attempts: 1 Airway Equipment and Method: Stylet and Oral airway Placement Confirmation: ETT inserted through vocal cords under direct vision, positive ETCO2 and breath sounds checked- equal and bilateral Secured at: 23 cm Tube secured with: Tape Dental Injury: Teeth and Oropharynx as per pre-operative assessment

## 2021-06-27 NOTE — Op Note (Signed)
Andrew [name] [id number] October 31, 1965 06/27/2021  Preoperative diagnosis: severe obesity  Postoperative diagnosis: Same   Procedure: Upper endoscopy   Surgeon: Gayland Curry M.D., FACS   Anesthesia: Gen.   Indications for procedure: 55 y.o. yo male undergoing a laparoscopic roux en y gastric bypass and an upper endoscopy was requested to evaluate the anastomosis.  Description of procedure: After we have completed the new gastrojejunostomy, I scrubbed out and obtained the Olympus endoscope. I gently placed endoscope in the patient's oropharynx and gently glided it down the esophagus without any difficulty under direct visualization. Once I was in the gastric pouch, I insufflated the pouch was air. The pouch was approximately 6 cm in size. I was able to cannulate and advanced the scope through the gastrojejunostomy. Dr.Martin had placed saline in the upper abdomen. Upon further insufflation of the gastric pouch there was no evidence of bubbles. Upon further inspection of the gastric pouch, the mucosa appeared normal. There is no evidence of any mucosal abnormality. The gastric pouch and Roux limb were decompressed. The width of the gastrojejunal anastomosis was at least 3 cm. The scope was withdrawn. The patient tolerated this portion of the procedure well. Please see Dr Earlie Server operative note for details regarding the laparoscopic roux-en-y gastric bypass.  Leighton Ruff. Redmond Pulling, MD, FACS General, Bariatric, & Minimally Invasive Surgery [facility], [place]

## 2021-06-27 NOTE — Transfer of Care (Signed)
Immediate Anesthesia Transfer of Care Note  Patient: Andrew [name].  Procedure(s) Performed: LAPAROSCOPIC ROUX-EN-Y GASTRIC BYPASS WITH UPPER ENDOSCOPY (Abdomen)  Patient Location: PACU  Anesthesia Type:General  Level of Consciousness: awake, alert , oriented and patient cooperative  Airway & Oxygen Therapy: Patient Spontanous Breathing and Patient connected to face mask oxygen  Post-op Assessment: Report given to RN and Patient moving all extremities  Post vital signs: Reviewed and stable  Last Vitals:  Vitals Value Taken Time  BP 174/79 06/27/21 1056  Temp    Pulse 82 06/27/21 1058  Resp 19 06/27/21 1058  SpO2 100 % 06/27/21 1058  Vitals shown include unvalidated device data.  Last Pain:  Vitals:   06/27/21 0618  TempSrc:   PainSc: 0-No pain      Patients Stated Pain Goal: 3 ([id number])  Complications: No notable events documented.

## 2021-06-27 NOTE — Anesthesia Preprocedure Evaluation (Addendum)
Anesthesia Evaluation  Patient identified by MRN, date of birth, ID band Patient awake    Reviewed: Allergy & Precautions, NPO status , Patient's Chart, lab work & pertinent test results  Airway Mallampati: III  TM Distance: >3 FB Neck ROM: Full    Dental  (+) Teeth Intact, Dental Advisory Given   Pulmonary sleep apnea and Continuous Positive Airway Pressure Ventilation ,    Pulmonary exam normal breath sounds clear to auscultation       Cardiovascular hypertension, Pt. on medications (-) anginaNormal cardiovascular exam+ dysrhythmias  Rhythm:Regular Rate:Normal     Neuro/Psych negative neurological ROS  negative psych ROS   GI/Hepatic negative GI ROS, Neg liver ROS,   Endo/Other  diabetes, Type 2, Oral Hypoglycemic AgentsMorbid obesity  Renal/GU negative Renal ROS     Musculoskeletal negative musculoskeletal ROS (+)   Abdominal   Peds  Hematology negative hematology ROS (+)   Anesthesia Other Findings Day of surgery medications reviewed with the patient.  Reproductive/Obstetrics                            Anesthesia Physical Anesthesia Plan  ASA: 3  Anesthesia Plan: General   Post-op Pain Management:    Induction: Intravenous  PONV Risk Score and Plan: 2 and Midazolam, Dexamethasone and Ondansetron  Airway Management Planned: Oral ETT  Additional Equipment:   Intra-op Plan:   Post-operative Plan: Extubation in OR  Informed Consent: I have reviewed the patients History and Physical, chart, labs and discussed the procedure including the risks, benefits and alternatives for the proposed anesthesia with the patient or authorized representative who has indicated his/her understanding and acceptance.     Dental advisory given  Plan Discussed with: CRNA  Anesthesia Plan Comments:         Anesthesia Quick Evaluation

## 2021-06-27 NOTE — Progress Notes (Signed)

## 2021-06-27 NOTE — Progress Notes (Signed)
Patient has ambulated around unit. They have demonstrated IS use. No c/o nausea and pain. Water started and has already completed 6 oz.

## 2021-06-27 NOTE — Progress Notes (Signed)
Patient has started protein. 

## 2021-06-28 ENCOUNTER — Encounter (HOSPITAL_COMMUNITY): Payer: Self-pay | Admitting: Surgery

## 2021-06-28 LAB — CBC WITH DIFFERENTIAL/PLATELET
Abs Immature Granulocytes: 0.04 10*3/uL (ref 0.00–0.07)
Basophils Absolute: 0 10*3/uL (ref 0.0–0.1)
Basophils Relative: 0 %
Eosinophils Absolute: 0 10*3/uL (ref 0.0–0.5)
Eosinophils Relative: 0 %
HCT: 38 % — ABNORMAL LOW (ref 39.0–52.0)
Hemoglobin: 13.2 g/dL (ref 13.0–17.0)
Immature Granulocytes: 0 %
Lymphocytes Relative: 10 %
Lymphs Abs: 0.9 10*3/uL (ref 0.7–4.0)
MCH: 32.6 pg (ref 26.0–34.0)
MCHC: 34.7 g/dL (ref 30.0–36.0)
MCV: 93.8 fL (ref 80.0–100.0)
Monocytes Absolute: 0.5 10*3/uL (ref 0.1–1.0)
Monocytes Relative: 5 %
Neutro Abs: 7.7 10*3/uL (ref 1.7–7.7)
Neutrophils Relative %: 85 %
Platelets: 175 10*3/uL (ref 150–400)
RBC: 4.05 MIL/uL — ABNORMAL LOW (ref 4.22–5.81)
RDW: 12.2 % (ref 11.5–15.5)
WBC: 9.1 10*3/uL (ref 4.0–10.5)
nRBC: 0 % (ref 0.0–0.2)

## 2021-06-28 LAB — GLUCOSE, CAPILLARY
Glucose-Capillary: 140 mg/dL — ABNORMAL HIGH (ref 70–99)
Glucose-Capillary: 148 mg/dL — ABNORMAL HIGH (ref 70–99)
Glucose-Capillary: 111 mg/dL — ABNORMAL HIGH (ref 70–99)
Glucose-Capillary: 123 mg/dL — ABNORMAL HIGH (ref 70–99)

## 2021-06-28 MED ORDER — OXYCODONE HCL 5 MG PO TABS: 5.0000 mg | ORAL_TABLET | Freq: Four times a day (QID) | ORAL | 0 refills | Status: DC | PRN

## 2021-06-28 MED ORDER — ONDANSETRON 4 MG PO TBDP: 4.0000 mg | ORAL_TABLET | Freq: Four times a day (QID) | ORAL | 0 refills | Status: DC | PRN

## 2021-06-28 NOTE — Progress Notes (Signed)
Patient alert and oriented, pain is controlled. Patient is tolerating fluids, advanced to protein shake today, patient is tolerating well. Reviewed Gastric Bypass discharge instructions with patient and patient is able to articulate understanding. Provided information on BELT program, Support Group and [facility] outpatient pharmacy. All questions answered, will continue to monitor.    

## 2021-06-28 NOTE — Discharge Instructions (Signed)

## 2021-06-28 NOTE — Progress Notes (Signed)
24hr fluid recall prior to discharge: 859m.  Per dehydration protocol, will call pt to f/u within one week post op.

## 2021-06-28 NOTE — Discharge Summary (Signed)
Physician Discharge Summary  Patient ID: Andrew [name]. MRN: [id number] DOB/AGE: 1966/04/17 55 y.o.  PCP: Horald Pollen, MD  Admit date: 06/27/2021 Discharge date: 06/28/2021  Admission Diagnoses:  morbid obesity and DM  Discharge Diagnoses:  same  Principal Problem:   Lap roux en Y gastric bypass August 2022 Active Problems:   S/P gastric bypass   Surgery:  lap roux en Y gastric bypass  Discharged Condition: improved  Hospital Course:   had surgery on Tuesday.  Was begun on liquids and he tolerated and was ready for discharge on Wednesday.  Consults: none  Significant Diagnostic Studies: none    Discharge Exam: Blood pressure 124/79, pulse (!) 49, temperature 97.8 F (36.6 C), temperature source Oral, resp. rate 18, height '5\' 7"'$  (1.702 m), weight (!) 139.9 kg, SpO2 98 %. Incisions healing ok  Disposition: Discharge disposition: 01-Home or Self Care       Discharge Instructions     Ambulate hourly while awake   Complete by: As directed    Call MD for:  difficulty breathing, headache or visual disturbances   Complete by: As directed    Call MD for:  persistant dizziness or light-headedness   Complete by: As directed    Call MD for:  persistant nausea and vomiting   Complete by: As directed    Call MD for:  redness, tenderness, or signs of infection (pain, swelling, redness, odor or green/yellow discharge around incision site)   Complete by: As directed    Call MD for:  severe uncontrolled pain   Complete by: As directed    Call MD for:  temperature >101 F   Complete by: As directed    Diet bariatric full liquid   Complete by: As directed    Incentive spirometry   Complete by: As directed    Perform hourly while awake      Allergies as of 06/28/2021       Reactions   Crestor [rosuvastatin Calcium] Other (See Comments)   Joint pain   Testosterone Rash   SEVERE RASH WITH ITCHING (TOPICAL AND INJECTION)        Medication List      STOP taking these medications    aspirin 81 MG tablet       TAKE these medications    ezetimibe 10 MG tablet Commonly known as: ZETIA Take 1 tablet (10 mg total) by mouth daily.   lisinopril-hydrochlorothiazide 20-12.5 MG tablet Commonly known as: ZESTORETIC TAKE 1 TABLET BY MOUTH DAILY Notes to patient: Monitor Blood Pressure Daily and keep a log for primary care physician.  You may need to make changes to your medications with rapid weight loss.     metFORMIN 500 MG tablet Commonly known as: GLUCOPHAGE TAKE 1 TABLET BY MOUTH TWICE DAILY WITH A MEAL Notes to patient: Monitor Blood Sugar Frequently and keep a log for primary care physician, you may need to adjust medication dosage with rapid weight loss.     multivitamin tablet Take 1 tablet by mouth daily.   ondansetron 4 MG disintegrating tablet Commonly known as: ZOFRAN-ODT Take 1 tablet (4 mg total) by mouth every 6 (six) hours as needed for nausea or vomiting.   oxyCODONE 5 MG immediate release tablet Commonly known as: Oxy IR/ROXICODONE Take 1 tablet (5 mg total) by mouth every 6 (six) hours as needed for severe pain.   oxymetazoline 0.05 % nasal spray Commonly known as: AFRIN Place 1 spray into both nostrils at bedtime.  Follow-up Information     [facility]. Go on 07/20/2021.   Specialty: General Surgery Why: at 9:15am with Dr. Hassell Done.  Please arrive 15 minutes prior to your appointment time.  Thank you. Contact information: [address] [place] [address] [phone]         [name], PA-C. Go on 08/17/2021.   Specialty: General Surgery Why: at 9am for Dr. Hassell Done.  Please arrive 15 minutes prior to your appointment time.  Thank you. Contact information: [place] [address] [phone]                 Signed: [name] 06/28/2021, 11:22 AM

## 2021-06-28 NOTE — Plan of Care (Signed)
Instructions were reviewed with patient. All questions were answered. Patient was transported to main entrance by wheelchair. ° °

## 2021-06-28 NOTE — Progress Notes (Signed)
Nutrition Education Note ° °Received consult for diet education for patient s/p bariatric surgery. ° °Discussed 2 week post op diet with pt. Emphasized that liquids must be non carbonated, non caffeinated, and sugar free. Fluid goals discussed. Pt to follow up with outpatient bariatric RD for further diet progression after 2 weeks. Multivitamins and minerals also reviewed. Teach back method used, pt expressed understanding, expect good compliance. ° °If nutrition issues arise, please consult RD. ° °Kenecia Barren, MS, RD, LDN °Inpatient Clinical Dietitian °Contact information available via Amion ° ° °

## 2021-06-30 ENCOUNTER — Telehealth (HOSPITAL_COMMUNITY): Payer: Self-pay | Admitting: *Deleted

## 2021-06-30 NOTE — Telephone Encounter (Signed)
1.  Tell me about your pain and pain management? Pt denies any pain.  2.  Let's talk about fluid intake.  How much total fluid are you taking in? Pt has consumed 17oz of bottled water and jello today.  Pt states that he is working to meet goal of 64 oz of fluid today.  Pt encouraged to continue to work towards meeting goal.  Pt instructed to assess status and suggestions daily utilizing Hydration Action Plan on discharge folder and to call [facility] if in the "red zone".   3.  How much protein have you taken in the last 2 days? Pt states that he is working to meet goal of goal of 80g of protein today.  Pt has not consumed any protein today, states that he is having a hard time "getting them down".  Pt is drinking a different brand than from hospitalization, which he tolerated very well.  Encouraged pt to purchase Ensure Max Protein to see if he is able to meet protein goal daily.  4.  Have you had nausea?  Tell me about when have experienced nausea and what you did to help? Pt denies nausea.   5.  Has the frequency or color changed with your urine? Pt states that he is urinating "fine" with no changes in frequency or urgency.     6.  Tell me what your incisions look like? "Incisions look fine". Pt denies a fever, chills.  Pt states incisions are not swollen, open, or draining.  Pt encouraged to call [facility] if incisions change.   7.  Have you been passing gas? BM? Pt states that he has not had a BM.  Pt instructed to take either Miralax or MoM as instructed per "Gastric Bypass/Sleeve Discharge Home Care Instructions".  Pt to call surgeon's office if not able to have BM with medication.   8.  If a problem or question were to arise who would you call?  Do you know contact numbers for [facility], [facility], and [facility]? Pt denies dehydration symptoms, denies fatigue.  Pt can describe s/sx of dehydration.  Pt knows to call [facility] for surgical, [facility] for nutrition, and [facility] for non-urgent questions or concerns.   9.  How has  the walking going? Pt states he is walking around and able to be active without difficulty.   10. Are you still using your incentive spirometer?  If so, how often? Pt did not take I.S. home from hospital.  Pt instructed to be intentional with TCDB exercises throughout the day.  11.  How are your vitamins and calcium going?  How are you taking them? Pt states that he is taking his supplements and vitamins without difficulty.  Reminded patient that the first 30 days post-operatively are important for successful recovery.  Practice good hand hygiene, wearing a mask when appropriate (since optional in most places), and minimizing exposure to people who live outside of the home, especially if they are exhibiting any respiratory, GI, or illness-like symptoms.

## 2021-07-12 ENCOUNTER — Encounter: Payer: BC Managed Care – PPO | Attending: Surgery | Admitting: Skilled Nursing Facility1

## 2021-07-12 ENCOUNTER — Other Ambulatory Visit: Payer: Self-pay

## 2021-07-12 DIAGNOSIS — E669 Obesity, unspecified: Secondary | ICD-10-CM | POA: Diagnosis not present

## 2021-07-12 NOTE — Progress Notes (Signed)
2 Week Post-Operative Nutrition Class   Patient was seen on 07/12/2021 for Post-Operative Nutrition education at the [facility].   Clinical  Medical hx: HTN, Diabetes Medications: see list  Labs: A1C 7.6, triglycerides 242, HDL 29.2 Notable signs/symptoms: sweating, shaky when working longer harder hours at work Any previous deficiencies? No   Surgery date: 06/27/2021 Surgery type: RYGB Start weight at [facility]: 312 pounds Weight today: 274.8 pounds Bowel Habits: Every day to every other day no complaints  Pt states he checks his blood sugars a couple times a week getting about 105.  Pt states he was eating solid foods the week after surgery.   Pt states he has been eating chicken and shrimp and tuna salad. Pt states he has pretty much added most foods back in  24hr recall:  Breakfast: protein shake powder + almond milk Snack: chicken + P3 Lunch: chicken or tuna + crackers Snack: protein bar Dinner: chicken or tuna salad + crackers Snack:   Beverages: water, Gatorade protein, sugar free tea    Body Composition Scale 07/12/2021  Current Body Weight 274.8  Total Body Fat % 36.9  Visceral Fat 32  Fat-Free Mass % 63   Total Body Water % 44  Muscle-Mass lbs 51.8  BMI 42.8  Body Fat Displacement          Torso  lbs 63         Left Leg  lbs 12.6         Right Leg  lbs 12.6         Left Arm  lbs 6.3         Right Arm   lbs 6.3      The following the learning objectives were met by the patient during this course: Identifies Phase 3 (Soft, High Proteins) Dietary Goals and will begin from 2 weeks post-operatively to 2 months post-operatively Identifies appropriate sources of fluids and proteins  Identifies appropriate fat sources and healthy verses unhealthy fat types   States protein recommendations and appropriate sources post-operatively Identifies the need for appropriate texture modifications, mastication, and bite sizes when consuming  solids Identifies appropriate fat consumption and sources Identifies appropriate multivitamin and calcium sources post-operatively Describes the need for physical activity post-operatively and will follow MD recommendations States when to call healthcare provider regarding medication questions or post-operative complications   Handouts given during class include: Phase 3A: Soft, High Protein Diet Handout Phase 3 High Protein Meals Healthy Fats   Follow-Up Plan: Patient will follow-up at [facility] in 6 weeks for 2 month post-op nutrition visit for diet advancement per MD.

## 2021-07-19 ENCOUNTER — Ambulatory Visit: Payer: BC Managed Care – PPO | Admitting: Skilled Nursing Facility1

## 2021-08-17 NOTE — Patient Instructions (Addendum)
Please continue using your CPAP regularly. While your insurance requires that you use CPAP at least 4 hours each night on 70% of the nights, I recommend, that you not skip any nights and use it throughout the night if you can. Getting used to CPAP and staying with the treatment long term does take time and patience and discipline. Untreated obstructive sleep apnea when it is moderate to severe can have an adverse impact on cardiovascular health and raise her risk for heart disease, arrhythmias, hypertension, congestive heart failure, stroke and diabetes. Untreated obstructive sleep apnea causes sleep disruption, nonrestorative sleep, and sleep deprivation. This can have an impact on your day to day functioning and cause daytime sleepiness and impairment of cognitive function, memory loss, mood disturbance, and problems focussing. Using CPAP regularly can improve these symptoms.   Follow up in 1 year. Mychart if you wish

## 2021-08-17 NOTE — Progress Notes (Signed)
PATIENT: Andrew [name]. DOB: 09-25-1966  REASON FOR VISIT: follow up HISTORY FROM: patient  Chief Complaint  Patient presents with   Obstructive Sleep Apnea    Rm 2, alone. Here for yearly CPAP f/u. Pt reports doing well. Pt reports having geriatric surgery and has helped w snoring and overall health. Has been using it less. Getting more sleep.       HISTORY OF PRESENT ILLNESS: 08/21/21 ALL: Andrew [name] returns for follow up for OSA on CPAP. He continues to do well on therapy. He is using CPAP nightly for at least 4 hours. He denies concerns with machine or supplies. He is resting well.   He is s/p roux-en-y gastric bypass 06/27/2021. He has lost over 55 pounds. He is followed closely. He would like to consider repeat sleep study next year.     08/23/2020 ALL:  Andrew [name]. is a 55 y.o. male here today for follow up for OSA on CPAP.  He reports that he is doing very well with CPAP therapy.  He is using CPAP nightly.  He denies any concerns with CPAP or supplies.  He is currently using a nasal mask.  He does have facial hair.  He has not noted an excess leak at home.  He is followed closely by primary care.  He had lab work performed yesterday.  A1c 6.9.  Triglycerides 224, HDL 29.  Compliance report dated 07/23/2020 through 08/21/2020 reveals that he used CPAP 30 of the past 30 days for compliance of 100%.  The CPAP greater than 4 hours all 30 days.  Average usage was 7 hours and 50 minutes.  Residual AHI 0.6 on 5 to 20 cm of water and EPR of 3.  There is a leak in the 95th percentile of 32.2 L/min. Per Andrew Dohmeier's last note, she suggested increasing min pressure to 7. It does not appear this was ordered.    HISTORY: (copied from Andrew Dohmeier's note on 08/24/2019)  HPI:  Andrew [name]. is a 55 y.o. male and is seen here 08-24-2019, in a yearly RV for CPAP compliance. He had questions about the Andrew [name]. He also has purchased a new mask online recently.    Mr.  [name] has been 100% compliant CPAP user his data includes 31 July 2019 at the 30 days prior.  His average use at time of 7 hours 37 minutes.  He is using an air sense 10 AutoSet with 5 to 20 cm pressure window and an EPR level of 3 the 95th percentile pressure is 11 cmH2O which allows [place] to reduce his maximum pressure setting to 15.  He has a residual AHI of 0.6/h which indicates excellent control of his apnea.  There are no central apneas emerging no Cheyne-Stokes respirations are noted, he does have moderate air leakage, his AHI again is excellent.  I suggested we are setting the minimum pressure to 7 cm water and the maximum pressure to 15 with a 2 cm EPR.   Rv. 08-11-2018, he asks for a prescription to get supplies through the internet. This is a revisit for Andrew [name]. Andrew [name]., [name] by 55 year old Caucasian right-handed male with a long-standing history of CPAP use in the treatment of obstructive sleep apnea.  He was originally referred to me in 2015 after having followed Andrew. Everlene [name]. He has remained 100% compliant CPAP user with an average time of use of 7 hours 32 minutes at night, his AutoSet is set  between 5 and 20 cmH2O with 3 cm EPR, his 95th percentile pressure is 10.9 cmH2O, his residual AHI is only 0.7/h.  There are no central apneas arising and there are no major air leaks.  He needs new supplies.  He has had some bad experience with his current durable medical equipment company which apparently communicated rather poorly with him.     HPI:  03-02-2014 -Andrew. Everlene [name] has followed this patient for many years. He had an episode of chest pain and was evaluated by cardiology. Given his family history of  cardiopulmonary disease , he was also asked to undergo a sleep study. However, due to financial reasons he was not able to do it at the time it was originally ordered. He was concerned about the costs. Andrew [name] was testing him for fatigue and sleepiness, and found to have hypo-testosteroemia.  He could not tolerate the testosterone supplements due to severe itching.     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 He aims for 10 PM and rises most morning at 6.30 , spontaneously , with a back up alarm. He is on call frequently, on average 7-8 hours of sleep.  No nocturia, no morning headaches, but a dry mouth  Loud snoring reported when on his back.  His bedroom is cool, quiet, dark.He shares the bed room with his wife.  I had the pleasure of seeing Andrew [name] over 3 years ago for the same symptoms and reasons that I see him today. At the time he ordered a sleep study but his insurance was not willing to pay for it. He has tried to postpone further evaluation but now feels that he has to get it done. He was seen on 08/17/2016 last by his primary care provider who refilled his antihypertensive medications. He encourage the patient to think about bariatric surgery and the patient has already had an appointment set up. The patient continues to work in a physical job and joined the [employer] for [employer] and cardio training with a final goal of weight loss.  He continues to present with a past medical history of allergies with respiratory impact, gastro-esophageal reflux disease, hyperlipidemia, hypertension on medication and obesity. He is not diabetic father and mother had heart disease and his mother had COPD and post parents also suffered from hyperlipidemia.His sister and mother had OSA and were on CPAP.    2nd October 2018. I had the pleasure of following up with Mr. Andrew [name] on his recent sleep studies. He was diagnosed with obstructive sleep apnea by home sleep test on 04/10/2017, medical history was reviewed, Epworth sleepiness score was endorsed at 14 points. The patient's AHI was 35.1 SPO2 nadir was  74%, total desaturation time at or below 88% was 52 minutes. No tachybradycardia arrhythmia noted. I recommended a CPAP titration was baseline and capnography which was declined by his insurance. We therefore used an auto titrate CPAP.  A compliance download from outdoor titration CPAP was obtained, the AutoSet was between 5 and 20 cm water with 3 cm EPR, total user time on average 7 hours and 6 minutes, 100% compliance for days, 97% compliance bedtime. There was only one day with the patient still short of a four-hour mark. Residual AHI is 0.3. The patient's 95th percentile pressure was 12.6/ hr.  he does have some higher air leaks using a nasal pillow. He uses now a nasal mask , large mask. He has now plans for bariatric surgery- and the auto-titrator will come in handy. He feels better , more energized, sleeps more soundly and his wife is happy that snoring stopped. No more nocturia.     Mr. Kimberlin has a family history of sudden cardiac death affecting his father at age 24. He has followed up with a cardiologist yearly.   for the first time the patient endorses a very high degree of sleepiness, he did not feel the same way 3 years ago when I last saw him.    I think that a sleep study is urgently needed also in preparation of bariatric surgery. He will have an appointment with his bariatric surgeon June 1. It would be lovely if he could do a split-night polysomnography before.   he is restless and his wife reports PLMs.  This cannot be appreciated in a HST>    Soc: ETOH 12 drinks a month, no tobacco use, caffeine - a lot, Coffee 4-6 a day. 3 diet cokes a week.  No pets , daughter is 53 and lives at home.     REVIEW OF SYSTEMS: Out of a complete 14 system review of symptoms, the patient complains only of the following symptoms, none and all other reviewed systems are negative.  ESS: 4  ALLERGIES: Allergies  Allergen Reactions   Crestor [Rosuvastatin Calcium] Other (See Comments)    Joint  pain   Testosterone Rash    SEVERE RASH WITH ITCHING (TOPICAL AND INJECTION)    HOME MEDICATIONS: Outpatient Medications Prior to Visit  Medication Sig Dispense Refill   ezetimibe (ZETIA) 10 MG tablet Take 1 tablet (10 mg total) by mouth daily. 90 tablet 3   lisinopril-hydrochlorothiazide (ZESTORETIC) 20-12.5 MG tablet TAKE 1 TABLET BY MOUTH DAILY 90 tablet 3   Multiple Vitamin (MULTIVITAMIN) tablet Take 1 tablet by mouth daily.     oxyCODONE (OXY IR/ROXICODONE) 5 MG immediate release tablet Take 1 tablet (5 mg total) by mouth every 6 (six) hours as needed for severe pain. 10 tablet 0   metFORMIN (GLUCOPHAGE) 500 MG tablet TAKE 1 TABLET BY MOUTH TWICE DAILY WITH A MEAL (Patient taking differently: Take 500 mg by mouth 2 (two) times daily with a meal.) 180 tablet 3   ondansetron (ZOFRAN-ODT) 4 MG disintegrating tablet Take 1 tablet (4 mg total) by mouth every 6 (six) hours as needed for nausea or vomiting. 20 tablet 0   oxymetazoline (AFRIN) 0.05 % nasal spray Place 1 spray into both nostrils at bedtime.     No facility-administered medications prior to visit.    PAST MEDICAL HISTORY: Past Medical History:  Diagnosis Date   Epididymal cyst    left   Family history of premature CAD    History of exercise intolerance    06-03-2015--  Andrew Angelena Form--  no ischemia or chest pain and no arrhythmias   Hyperlipidemia    Hypertension    cardiologist-  Andrew Angelena Form   Left posterior fascicular block    Obesity, Class III, BMI 40-49.9 (morbid obesity) ([place])    Right hydrocele    Wears glasses     PAST SURGICAL HISTORY: Past Surgical History:  Procedure Laterality Date   CARDIOVASCULAR STRESS TEST  04/23/2011   normal nuclear study w/ no ischemia (no change from previous exam)/  normal LV function and wall motion , ef 64%   COLONOSCOPY  2012  GASTRIC ROUX-EN-Y N/A 06/27/2021   Procedure: LAPAROSCOPIC ROUX-EN-Y GASTRIC BYPASS WITH UPPER ENDOSCOPY;  Surgeon: Johnathan Hausen, MD;  Location: [facility];  Service: General;  Laterality: N/A;   HERNIA REPAIR  infant   HYDROCELE EXCISION Right 09/17/2016   Procedure: HYDROCELECTOMY ADULT;  Surgeon: Kathie Rhodes, MD;  Location: [facility];  Service: Urology;  Laterality: Right;   WISDOM TOOTH EXTRACTION  2002    FAMILY HISTORY: Family History  Problem Relation Age of Onset   Heart attack Mother    COPD Mother    Heart disease Mother    Hyperlipidemia Mother    Heart attack Father    Heart disease Father    Hyperlipidemia Father    Hypertension Other     SOCIAL HISTORY: Social History   Socioeconomic History   Marital status: Married    Spouse name: [name]   Number of children: 1   Years of education: College   Highest education level: Not on file  Occupational History   Occupation: Therapist, occupational: [employer]  Tobacco Use   Smoking status: Never   Smokeless tobacco: Never  Vaping Use   Vaping Use: Never used  Substance and Sexual Activity   Alcohol use: Yes    Alcohol/week: 2.0 - 3.0 standard drinks    Types: 2 - 3 Standard drinks or equivalent per week    Comment: occasionaly beer (12 pk per month)   Drug use: No   Sexual activity: Never  Other Topics Concern   Not on file  Social History Narrative   [name]) and lives at home with his wife and one child.   Patient is working full-time.   Patient has a college education.   Patient is right-handed.   Patient drinks two cups of coffee daily.   Exercise 2 times a week  On treadmill   Social Determinants of Health   Financial Resource Strain: Not on file  Food Insecurity: Not on file  Transportation Needs: Not on file  Physical Activity: Not on file  Stress: Not on file  Social Connections: Not on file  Intimate Partner Violence: Not on file     PHYSICAL EXAM  Vitals:   08/21/21 0918  BP: 121/80  Pulse: 86  Weight: 267 lb (121.1 kg)  Height: 5\' 7"  (1.702 m)    Body mass index is 41.82  kg/m.  Generalized: Well developed, in no acute distress  Cardiology: normal rate and rhythm, no murmur noted Respiratory: clear to auscultation bilaterally  Neurological examination  Mentation: Alert oriented to time, place, history taking. Follows all commands speech and language fluent Cranial nerve II-XII: Pupils were equal round reactive to light. Extraocular movements were full, visual field were full  Motor: The motor testing reveals 5 over 5 strength of all 4 extremities. Good symmetric motor tone is noted throughout.  Gait and station: Gait is normal.    DIAGNOSTIC DATA (LABS, IMAGING, TESTING) - I reviewed patient records, labs, notes, testing and imaging myself where available.  No flowsheet data found.   Lab Results  Component Value Date   WBC 9.1 06/28/2021   HGB 13.2 06/28/2021   HCT 38.0 (L) 06/28/2021   MCV 93.8 06/28/2021   PLT 175 06/28/2021      Component Value Date/Time   NA 134 (L) 06/05/2021 0858   NA 137 08/22/2020 1003   K 4.1 06/05/2021 0858   CL 99 06/05/2021 0858   CO2 27  06/05/2021 0858   GLUCOSE 149 (H) 06/05/2021 0858   BUN 26 (H) 06/05/2021 0858   BUN 23 08/22/2020 1003   CREATININE 0.97 06/27/2021 1226   CREATININE 0.98 08/17/2016 0839   CALCIUM 8.8 (L) 06/05/2021 0858   PROT 7.5 06/05/2021 0858   PROT 7.1 08/22/2020 1003   ALBUMIN 4.4 06/05/2021 0858   ALBUMIN 4.4 08/22/2020 1003   AST 35 06/05/2021 0858   ALT 58 (H) 06/05/2021 0858   ALKPHOS 45 06/05/2021 0858   BILITOT 1.0 06/05/2021 0858   BILITOT 0.8 08/22/2020 1003   GFRNONAA >60 06/27/2021 1226   GFRNONAA >89 08/17/2016 0839   GFRAA 109 08/22/2020 1003   GFRAA >89 08/17/2016 0839   Lab Results  Component Value Date   CHOL 109 02/20/2021   HDL 29.20 (L) 02/20/2021   LDLCALC 59 08/22/2020   LDLDIRECT 65.0 02/20/2021   TRIG 242.0 (H) 02/20/2021   CHOLHDL 4 02/20/2021   Lab Results  Component Value Date   HGBA1C 7.6 (A) 05/08/2021   No results found for:  [id number] Lab Results  Component Value Date   TSH 1.240 05/17/2017     ASSESSMENT AND PLAN 55 y.o. year old male  has a past medical history of Epididymal cyst, Family history of premature CAD, History of exercise intolerance, Hyperlipidemia, Hypertension, Left posterior fascicular block, Obesity, Class III, BMI 40-49.9 (morbid obesity) ([place]), Right hydrocele, and Wears glasses. here with     ICD-10-CM   1. OSA on CPAP  G47.33 For home use only DME continuous positive airway pressure (CPAP)   Z99.89        [place] Andrew [name]. is doing well on CPAP therapy.  Compliance report reveals excellent compliance. He was encouraged to continue using CPAP nightly and for greater than 4 hours each night. We will update supply orders as indicated. Risks of untreated sleep apnea review and education materials provided. Healthy lifestyle habits encouraged.  He will continue close follow-up with primary care for diabetes, hyperlipidemia and hypertension.  He will continue follow up with bariatric surgeon as directed. He will follow up in 1, sooner if needed. He verbalizes understanding and agreement with this plan.    Orders Placed This Encounter  Procedures   For home use only DME continuous positive airway pressure (CPAP)    Supplies    Order Specific Question:   Length of Need    Answer:   Lifetime    Order Specific Question:   Patient has OSA or probable OSA    Answer:   Yes    Order Specific Question:   Is the patient currently using CPAP in the home    Answer:   Yes    Order Specific Question:   Settings    Answer:   Other see comments    Order Specific Question:   CPAP supplies needed    Answer:   Mask, headgear, cushions, filters, heated tubing and water chamber      No orders of the defined types were placed in this encounter.     Debbora Presto, FNP-C 08/21/2021, 9:53 AM [facility] [address], [place], [place] [address] [phone]

## 2021-08-21 ENCOUNTER — Encounter: Payer: Self-pay | Admitting: Family Medicine

## 2021-08-21 ENCOUNTER — Ambulatory Visit: Payer: BC Managed Care – PPO | Admitting: Family Medicine

## 2021-08-21 VITALS — BP 121/80 | HR 86 | Ht 67.0 in | Wt 267.0 lb

## 2021-08-21 DIAGNOSIS — G4733 Obstructive sleep apnea (adult) (pediatric): Secondary | ICD-10-CM

## 2021-08-21 DIAGNOSIS — Z9989 Dependence on other enabling machines and devices: Secondary | ICD-10-CM

## 2021-08-23 ENCOUNTER — Ambulatory Visit: Payer: BC Managed Care – PPO | Admitting: Skilled Nursing Facility1

## 2021-08-28 ENCOUNTER — Ambulatory Visit (INDEPENDENT_AMBULATORY_CARE_PROVIDER_SITE_OTHER): Payer: BC Managed Care – PPO | Admitting: Emergency Medicine

## 2021-08-28 ENCOUNTER — Other Ambulatory Visit: Payer: Self-pay

## 2021-08-28 ENCOUNTER — Encounter: Payer: Self-pay | Admitting: Emergency Medicine

## 2021-08-28 VITALS — BP 132/82 | HR 62 | Temp 97.9°F | Ht 67.0 in | Wt 264.0 lb

## 2021-08-28 DIAGNOSIS — Z9884 Bariatric surgery status: Secondary | ICD-10-CM | POA: Diagnosis not present

## 2021-08-28 DIAGNOSIS — I152 Hypertension secondary to endocrine disorders: Secondary | ICD-10-CM | POA: Diagnosis not present

## 2021-08-28 DIAGNOSIS — T466X5A Adverse effect of antihyperlipidemic and antiarteriosclerotic drugs, initial encounter: Secondary | ICD-10-CM

## 2021-08-28 DIAGNOSIS — E1169 Type 2 diabetes mellitus with other specified complication: Secondary | ICD-10-CM | POA: Diagnosis not present

## 2021-08-28 DIAGNOSIS — G720 Drug-induced myopathy: Secondary | ICD-10-CM | POA: Insufficient documentation

## 2021-08-28 DIAGNOSIS — G4733 Obstructive sleep apnea (adult) (pediatric): Secondary | ICD-10-CM | POA: Diagnosis not present

## 2021-08-28 DIAGNOSIS — Z9989 Dependence on other enabling machines and devices: Secondary | ICD-10-CM

## 2021-08-28 DIAGNOSIS — E1159 Type 2 diabetes mellitus with other circulatory complications: Secondary | ICD-10-CM

## 2021-08-28 DIAGNOSIS — E785 Hyperlipidemia, unspecified: Secondary | ICD-10-CM

## 2021-08-28 LAB — LIPID PANEL
Cholesterol: 123 mg/dL (ref 0–200)
HDL: 28.4 mg/dL — ABNORMAL LOW (ref >39.00)
NonHDL: 95.09
Total CHOL/HDL Ratio: 4
Triglycerides: 215 mg/dL — ABNORMAL HIGH (ref 0.0–149.0)
VLDL: 43 mg/dL — ABNORMAL HIGH (ref 0.0–40.0)

## 2021-08-28 LAB — COMPREHENSIVE METABOLIC PANEL
ALT: 45 U/L (ref 0–53)
AST: 29 U/L (ref 0–37)
Albumin: 4.5 g/dL (ref 3.5–5.2)
Alkaline Phosphatase: 77 U/L (ref 39–117)
BUN: 20 mg/dL (ref 6–23)
CO2: 31 mEq/L (ref 19–32)
Calcium: 9.5 mg/dL (ref 8.4–10.5)
Chloride: 102 mEq/L (ref 96–112)
Creatinine, Ser: 0.85 mg/dL (ref 0.40–1.50)
GFR: 98.18 mL/min (ref >60.00)
Glucose, Bld: 108 mg/dL — ABNORMAL HIGH (ref 70–99)
Potassium: 3.9 mEq/L (ref 3.5–5.1)
Sodium: 140 mEq/L (ref 135–145)
Total Bilirubin: 1.2 mg/dL (ref 0.2–1.2)
Total Protein: 7.2 g/dL (ref 6.0–8.3)

## 2021-08-28 LAB — POCT GLYCOSYLATED HEMOGLOBIN (HGB A1C): Hemoglobin A1C: 5.5 % (ref 4.0–5.6)

## 2021-08-28 LAB — LDL CHOLESTEROL, DIRECT: Direct LDL: 69 mg/dL

## 2021-08-28 NOTE — Assessment & Plan Note (Signed)
Well-controlled hypertension.  Continue Zestoretic 20-12.5 mg daily. Well-controlled diabetes off medications with hemoglobin A1c of 5.5 Responded very well to bariatric surgery.  No complications. Follow-up in 6 months.

## 2021-08-28 NOTE — Assessment & Plan Note (Signed)
Stable.  Continue CPAP treatment without any problems or complications.

## 2021-08-28 NOTE — Assessment & Plan Note (Signed)
Excellent response to treatment.  Losing weight.  No complications. Chronic medical conditions well controlled.

## 2021-08-28 NOTE — Assessment & Plan Note (Signed)
Lipid profile done today.  Off cholesterol medication at present time.

## 2021-08-28 NOTE — Progress Notes (Signed)
Andrew [name]. 55 y.o.   Chief Complaint  Patient presents with   Hypertension    6 month F/u    HISTORY OF PRESENT ILLNESS: This is a 55 y.o. male with history of diabetes, hypertension, dyslipidemia here for follow-up. Status post bariatric surgery last August with excellent results and no complications. History of hypertension on Zestoretic 20-12.5 mg daily. History of diabetes off metformin since 06/25/2021.  Glucose at home average 100. History of dyslipidemia off Zetia since 06/25/2021. Doing well.  Has no complaints or medical concerns today. BP Readings from Last 3 Encounters:  08/21/21 121/80  06/28/21 124/79  06/05/21 (!) 160/97   Lab Results  Component Value Date   HGBA1C 7.6 (A) 05/08/2021   Wt Readings from Last 3 Encounters:  08/28/21 267 lb (121.1 kg)  08/21/21 267 lb (121.1 kg)  07/12/21 274 lb 12.8 oz (124.6 kg)  Last bariatric surgery assessment and plan as follows: Assessment and Plan:   Diagnoses and all orders for this visit:  Morbid (Andrew) obesity due to excess calories (CMS-HCC)  Status post gastric bypass for obesity   He is overall doing well. Total weight loss of 46 pounds since surgery. No signs of dehydration. Fluid and protein intake are good. We discussed the importance of proper eating techniques and behaviors. We discussed the importance of follow-up with the dietitian and regular physical activity -he has a [employer] and goes when he has time. We also discussed the importance of taking supplements as directed. He will follow-up as listed below and call with questions/concerns in the meantime.  Return in about 3 months (around 11/25/2021) for Andrew [name].  Andrew Hurl, PA-C    Hypertension Pertinent negatives include no chest pain, headaches, palpitations or shortness of breath.    Prior to Admission medications   Medication Sig Start Date End Date Taking? Authorizing Provider  ezetimibe (ZETIA) 10 MG tablet  Take 1 tablet (10 mg total) by mouth daily. 02/20/21  Yes Andrew Pollen, MD  lisinopril-hydrochlorothiazide (ZESTORETIC) 20-12.5 MG tablet TAKE 1 TABLET BY MOUTH DAILY 02/24/21  Yes Andrew [name], Andrew Bloomer, MD  Multiple Vitamin (MULTIVITAMIN) tablet Take 1 tablet by mouth daily.   Yes [provider]  oxyCODONE (OXY IR/ROXICODONE) 5 MG immediate release tablet Take 1 tablet (5 mg total) by mouth every 6 (six) hours as needed for Andrew pain. 06/28/21  Yes Andrew Hausen, MD    Allergies  Allergen Reactions   Andrew [name] [Andrew [name]] Other (See Comments)    Joint pain   Testosterone Rash    Andrew [name] (TOPICAL AND INJECTION)    Patient Active Problem List   Diagnosis Date Noted   Lap roux en Y gastric bypass August 2022 06/27/2021   S/P gastric bypass 06/27/2021   Dyslipidemia associated with type 2 diabetes mellitus (Andrew [name]) 05/08/2021   Statin intolerance 08/22/2020   High triglycerides 11/30/2019   Prediabetes 11/28/2018   OSA on CPAP 05/17/2017   Morbid obesity ([place]) 05/17/2017   Hydrocele, right 11/17/2015   Hypertension associated with diabetes ([place]) 05/13/2012   Hypogonadism male 05/13/2012    Past Medical History:  Diagnosis Date   Epididymal cyst    left   Family history of premature CAD    History of exercise intolerance    06-03-2015--  dr Andrew [name]--  no ischemia or chest pain and no arrhythmias   Hyperlipidemia    Hypertension    cardiologist-  dr Andrew [name]   Left posterior fascicular block  Obesity, Class III, BMI 40-49.9 (morbid obesity) (Andrew [name])    Right hydrocele    Wears glasses     Past Surgical History:  Procedure Laterality Date   CARDIOVASCULAR STRESS TEST  04/23/2011   normal nuclear study w/ no ischemia (no change from previous exam)/  normal LV function and wall motion , ef 64%   COLONOSCOPY  2012   GASTRIC ROUX-EN-Y N/A 06/27/2021   Procedure: LAPAROSCOPIC ROUX-EN-Y GASTRIC BYPASS WITH UPPER ENDOSCOPY;  Surgeon:  Andrew Hausen, MD;  Location: [facility];  Service: General;  Laterality: N/A;   HERNIA REPAIR  infant   HYDROCELE EXCISION Right 09/17/2016   Procedure: HYDROCELECTOMY ADULT;  Surgeon: Andrew Rhodes, MD;  Location: [facility];  Service: Urology;  Laterality: Right;   WISDOM TOOTH EXTRACTION  2002    Social History   Socioeconomic History   Marital status: Married    Spouse name: Andrew [name]   Number of children: 1   Years of education: [employer] education level: Not on file  Occupational History   Occupation: Therapist, occupational: [employer]  Tobacco Use   Smoking status: Never   Smokeless tobacco: Never  Vaping Use   Vaping Use: Never used  Substance and Sexual Activity   Alcohol use: Yes    Alcohol/week: 2.0 - 3.0 standard drinks    Types: 2 - 3 Standard drinks or equivalent per week    Comment: occasionaly beer (12 pk per month)   Drug use: No   Sexual activity: Never  Other Topics Concern   Not on file  Social History Narrative   Patient is married Andrew [name]) and lives at home with his wife and one child.   Patient is working full-time.   Patient has a college education.   Patient is right-handed.   Patient drinks two cups of coffee daily.   Exercise 2 times a week  On treadmill   Social Determinants of Health   Financial Resource Strain: Not on file  Food Insecurity: Not on file  Transportation Needs: Not on file  Physical Activity: Not on file  Stress: Not on file  Social Connections: Not on file  Intimate Partner Violence: Not on file    Family History  Problem Relation Age of Onset   Heart attack Mother    COPD Mother    Heart disease Mother    Hyperlipidemia Mother    Heart attack Father    Heart disease Father    Hyperlipidemia Father    Hypertension Other      Review of Systems  Constitutional: Negative.  Negative for chills and fever.  HENT: Negative.  Negative for congestion and sore throat.   Respiratory:  Negative.  Negative for cough and shortness of breath.   Cardiovascular: Negative.  Negative for chest pain and palpitations.  Gastrointestinal:  Negative for abdominal pain, diarrhea, nausea and vomiting.  Genitourinary: Negative.   Musculoskeletal: Negative.   Skin: Negative.  Negative for rash.  Neurological: Negative.  Negative for dizziness and headaches.  Today's Vitals   08/28/21 0758  BP: 132/82  Pulse: 62  Temp: 97.9 F (36.6 C)  TempSrc: Oral  SpO2: 97%  Weight: 264 lb (119.7 kg)  Height: 5\' 7"  (1.702 m)   Body mass index is 41.35 kg/m. Wt Readings from Last 3 Encounters:  08/28/21 264 lb (119.7 kg)  08/21/21 267 lb (121.1 kg)  07/12/21 274 lb 12.8 oz (124.6 kg)    Physical Exam Constitutional:  Appearance: Normal appearance.  HENT:     Head: Normocephalic.  Eyes:     Extraocular Movements: Extraocular movements intact.     Pupils: Pupils are equal, round, and reactive to light.  Cardiovascular:     Rate and Rhythm: Normal rate and regular rhythm.     Pulses: Normal pulses.     Heart sounds: Normal heart sounds.  Pulmonary:     Effort: Pulmonary effort is normal.     Breath sounds: Normal breath sounds.  Abdominal:     Palpations: Abdomen is soft.     Tenderness: There is no abdominal tenderness.  Musculoskeletal:     Cervical back: Normal range of motion and neck supple.  Skin:    General: Skin is warm and dry.     Capillary Refill: Capillary refill takes less than 2 seconds.  Neurological:     General: No focal deficit present.     Mental Status: He is alert and oriented to person, place, and time.  Psychiatric:        Mood and Affect: Mood normal.        Behavior: Behavior normal.     Results for orders placed or performed in visit on 08/28/21 (from the past 24 hour(s))  POCT glycosylated hemoglobin (Hb A1C)     Status: None   Collection Time: 08/28/21  8:09 AM  Result Value Ref Range   Hemoglobin A1C 5.5 4.0 - 5.6 %   HbA1c POC (<>  result, manual entry)     HbA1c, POC (prediabetic range)     HbA1c, POC (controlled diabetic range)       ASSESSMENT & PLAN: Problem List Items Addressed This Visit       Cardiovascular and Mediastinum   Hypertension associated with diabetes ([place]) - Primary    Well-controlled hypertension.  Continue Zestoretic 20-12.5 mg daily. Well-controlled diabetes off medications with hemoglobin A1c of 5.5 Responded very well to bariatric surgery.  No complications. Follow-up in 6 months.      Relevant Orders   POCT glycosylated hemoglobin (Hb A1C) (Completed)   Comprehensive metabolic panel     Respiratory   OSA on CPAP    Stable.  Continue CPAP treatment without any problems or complications.        Endocrine   Dyslipidemia associated with type 2 diabetes mellitus ([place])    Lipid profile [name] today.  Off cholesterol medication at present time.      Relevant Orders   Lipid panel     Other   S/P gastric bypass    Excellent response to treatment.  Losing weight.  No complications. Chronic medical conditions well controlled.      Patient Instructions  Health Maintenance, Male Adopting a healthy lifestyle and getting preventive care are important in promoting health and wellness. Ask your health care provider about: The right schedule for you to have regular tests and exams. Things you can do on your own to prevent diseases and keep yourself healthy. What should I know about diet, weight, and exercise? Eat a healthy diet  Eat a diet that includes plenty of vegetables, fruits, low-fat dairy products, and lean protein. Do not eat a lot of foods that are high in solid fats, added sugars, or sodium. Maintain a healthy weight Body mass index (BMI) is a measurement that can be used to identify possible weight problems. It estimates body fat based on height and weight. Your health care provider can help determine your BMI and help you achieve or  maintain a healthy weight. Get regular  exercise Get regular exercise. This is one of the most important things you can do for your health. Most adults should: Exercise for at least 150 minutes each week. The exercise should increase your heart rate and make you sweat (moderate-intensity exercise). Do strengthening exercises at least twice a week. This is in addition to the moderate-intensity exercise. Spend less time sitting. Even light physical activity can be beneficial. Watch cholesterol and blood lipids Have your blood tested for lipids and cholesterol at 55 years of age, then have this test every 5 years. You may need to have your cholesterol levels checked more often if: Your lipid or cholesterol levels are high. You are older than 55 years of age. You are at high risk for heart disease. What should I know about cancer screening? Many types of cancers can be detected early and may often be prevented. Depending on your health history and family history, you may need to have cancer screening at various ages. This may include screening for: Colorectal cancer. Prostate cancer. Skin cancer. Lung cancer. What should I know about heart disease, diabetes, and high blood pressure? Blood pressure and heart disease High blood pressure causes heart disease and increases the risk of stroke. This is more likely to develop in people who have high blood pressure readings, are of African descent, or are overweight. Talk with your health care provider about your target blood pressure readings. Have your blood pressure checked: Every 3-5 years if you are 54-46 years of age. Every year if you are 26 years old or older. If you are between the ages of 45 and 19 and are a current or former smoker, ask your health care provider if you should have a one-time screening for abdominal aortic aneurysm (AAA). Diabetes Have regular diabetes screenings. This checks your fasting blood sugar level. Have the screening [name]: Once every three years after age  20 if you are at a normal weight and have a low risk for diabetes. More often and at a younger age if you are overweight or have a high risk for diabetes. What should I know about preventing infection? Hepatitis B If you have a higher risk for hepatitis B, you should be screened for this virus. Talk with your health care provider to find out if you are at risk for hepatitis B infection. Hepatitis C Blood testing is recommended for: Everyone born from [birth year] through 1965. Anyone with known risk factors for hepatitis C. Sexually transmitted infections (STIs) You should be screened each year for STIs, including gonorrhea and chlamydia, if: You are sexually active and are younger than 55 years of age. You are older than 55 years of age and your health care provider tells you that you are at risk for this type of infection. Your sexual activity has changed since you were last screened, and you are at increased risk for chlamydia or gonorrhea. Ask your health care provider if you are at risk. Ask your health care provider about whether you are at high risk for HIV. Your health care provider may recommend a prescription medicine to help prevent HIV infection. If you choose to take medicine to prevent HIV, you should first get tested for HIV. You should then be tested every 3 months for as long as you are taking the medicine. Follow these instructions at home: Lifestyle Do not use any products that contain nicotine or tobacco, such as cigarettes, e-cigarettes, and chewing tobacco. If you need help  quitting, ask your health care provider. Do not use street drugs. Do not share needles. Ask your health care provider for help if you need support or information about quitting drugs. Alcohol use Do not drink alcohol if your health care provider tells you not to drink. If you drink alcohol: Limit how much you have to 0-2 drinks a day. Be aware of how much alcohol is in your drink. In the [place] one drink  equals one 12 oz bottle of beer (355 mL), one 5 oz glass of wine (148 mL), or one 1 oz glass of hard liquor (44 mL). General instructions Schedule regular health, dental, and eye exams. Stay current with your vaccines. Tell your health care provider if: You often feel depressed. You have ever been abused or do not feel safe at home. Summary Adopting a healthy lifestyle and getting preventive care are important in promoting health and wellness. Follow your health care provider's instructions about healthy diet, exercising, and getting tested or screened for diseases. Follow your health care provider's instructions on monitoring your cholesterol and blood pressure. This information is not intended to replace advice given to you by your health care provider. Make sure you discuss any questions you have with your health care provider. Document Revised: 12/23/2020 Document Reviewed: 10/08/2018 Elsevier Patient Education  2022 [place], MD [facility]

## 2021-08-28 NOTE — Patient Instructions (Signed)
Health Maintenance, Male Adopting a healthy lifestyle and getting preventive care are important in promoting health and wellness. Ask your health care provider about: The right schedule for you to have regular tests and exams. Things you can do on your own to prevent diseases and keep yourself healthy. What should I know about diet, weight, and exercise? Eat a healthy diet  Eat a diet that includes plenty of vegetables, fruits, low-fat dairy products, and lean protein. Do not eat a lot of foods that are high in solid fats, added sugars, or sodium. Maintain a healthy weight Body mass index (BMI) is a measurement that can be used to identify possible weight problems. It estimates body fat based on height and weight. Your health care provider can help determine your BMI and help you achieve or maintain a healthy weight. Get regular exercise Get regular exercise. This is one of the most important things you can do for your health. Most adults should: Exercise for at least 150 minutes each week. The exercise should increase your heart rate and make you sweat (moderate-intensity exercise). Do strengthening exercises at least twice a week. This is in addition to the moderate-intensity exercise. Spend less time sitting. Even light physical activity can be beneficial. Watch cholesterol and blood lipids Have your blood tested for lipids and cholesterol at 55 years of age, then have this test every 5 years. You may need to have your cholesterol levels checked more often if: Your lipid or cholesterol levels are high. You are older than 55 years of age. You are at high risk for heart disease. What should I know about cancer screening? Many types of cancers can be detected early and may often be prevented. Depending on your health history and family history, you may need to have cancer screening at various ages. This may include screening for: Colorectal cancer. Prostate cancer. Skin cancer. Lung  cancer. What should I know about heart disease, diabetes, and high blood pressure? Blood pressure and heart disease High blood pressure causes heart disease and increases the risk of stroke. This is more likely to develop in people who have high blood pressure readings, are of African descent, or are overweight. Talk with your health care provider about your target blood pressure readings. Have your blood pressure checked: Every 3-5 years if you are 18-39 years of age. Every year if you are 40 years old or older. If you are between the ages of 65 and 75 and are a current or former smoker, ask your health care provider if you should have a one-time screening for abdominal aortic aneurysm (AAA). Diabetes Have regular diabetes screenings. This checks your fasting blood sugar level. Have the screening done: Once every three years after age 45 if you are at a normal weight and have a low risk for diabetes. More often and at a younger age if you are overweight or have a high risk for diabetes. What should I know about preventing infection? Hepatitis B If you have a higher risk for hepatitis B, you should be screened for this virus. Talk with your health care provider to find out if you are at risk for hepatitis B infection. Hepatitis C Blood testing is recommended for: Everyone born from 1945 through 1965. Anyone with known risk factors for hepatitis C. Sexually transmitted infections (STIs) You should be screened each year for STIs, including gonorrhea and chlamydia, if: You are sexually active and are younger than 55 years of age. You are older than 55 years   of age and your health care provider tells you that you are at risk for this type of infection. Your sexual activity has changed since you were last screened, and you are at increased risk for chlamydia or gonorrhea. Ask your health care provider if you are at risk. Ask your health care provider about whether you are at high risk for HIV.  Your health care provider may recommend a prescription medicine to help prevent HIV infection. If you choose to take medicine to prevent HIV, you should first get tested for HIV. You should then be tested every 3 months for as long as you are taking the medicine. Follow these instructions at home: Lifestyle Do not use any products that contain nicotine or tobacco, such as cigarettes, e-cigarettes, and chewing tobacco. If you need help quitting, ask your health care provider. Do not use street drugs. Do not share needles. Ask your health care provider for help if you need support or information about quitting drugs. Alcohol use Do not drink alcohol if your health care provider tells you not to drink. If you drink alcohol: Limit how much you have to 0-2 drinks a day. Be aware of how much alcohol is in your drink. In the [place] one drink equals one 12 oz bottle of beer (355 mL), one 5 oz glass of wine (148 mL), or one 1 oz glass of hard liquor (44 mL). General instructions Schedule regular health, dental, and eye exams. Stay current with your vaccines. Tell your health care provider if: You often feel depressed. You have ever been abused or do not feel safe at home. Summary Adopting a healthy lifestyle and getting preventive care are important in promoting health and wellness. Follow your health care provider's instructions about healthy diet, exercising, and getting tested or screened for diseases. Follow your health care provider's instructions on monitoring your cholesterol and blood pressure. This information is not intended to replace advice given to you by your health care provider. Make sure you discuss any questions you have with your health care provider. Document Revised: 12/23/2020 Document Reviewed: 10/08/2018 Elsevier Patient Education  2022 Elsevier Inc[employer]  

## 2021-11-24 DIAGNOSIS — Z9884 Bariatric surgery status: Secondary | ICD-10-CM | POA: Diagnosis not present

## 2021-12-29 IMAGING — CT CT CHEST W/O CM
2 of 4 series · 15 of 36 positions shown, 18 images · non-contrast
Comparison: Cardiac CT 08/06/2019.

CLINICAL DATA: Follow up pulmonary nodule seen on cardiac scan. No
given history of malignancy.

EXAM:
CT CHEST WITHOUT CONTRAST
TECHNIQUE: Multidetector CT imaging of the chest was performed following the
standard protocol without IV contrast.

[Series 2: thorax · axial · 0.86mm/px · z∈[-400,-106]mm · 12 of 175 slices shown, 15 images]
[im 14/175  mediastinal]
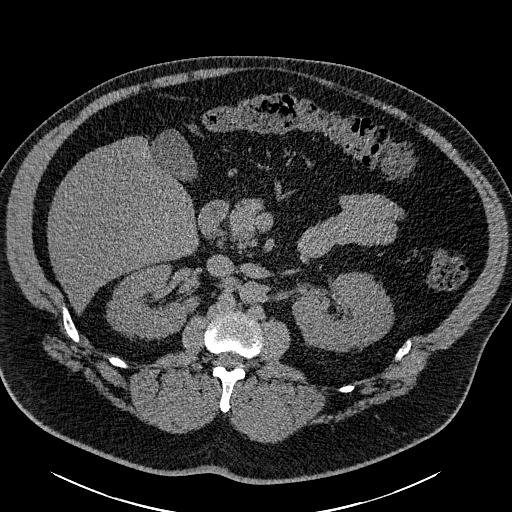
[im 14/175  lung]
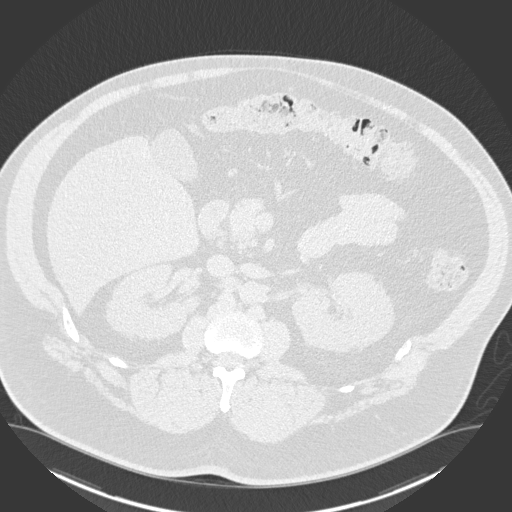
[im 27/175  lung]
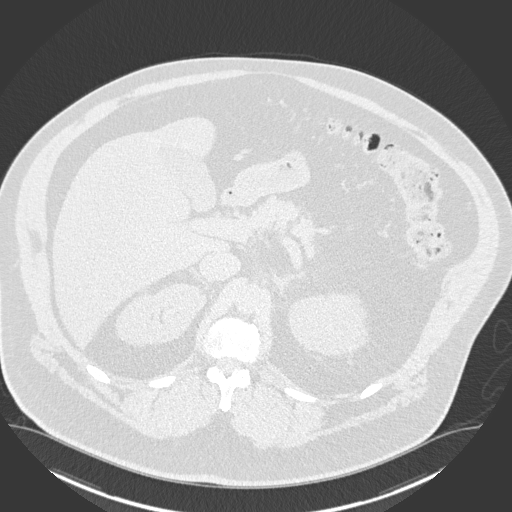
[im 41/175  lung]
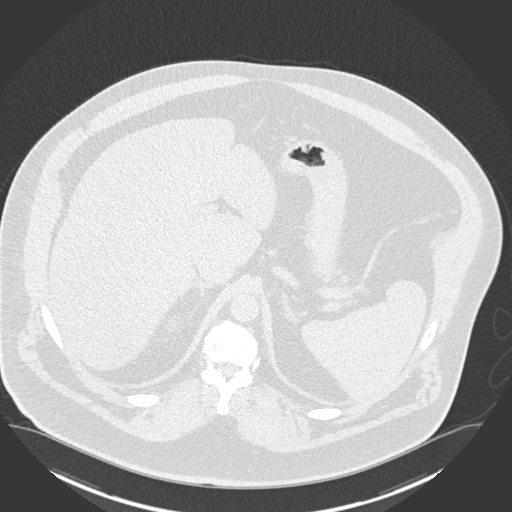
[im 54/175  lung]
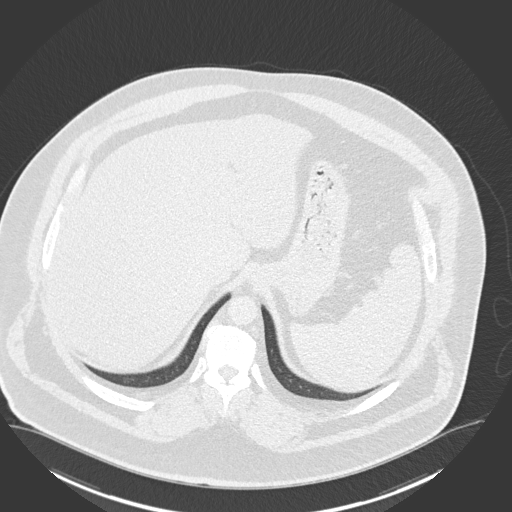
[im 67/175  mediastinal]
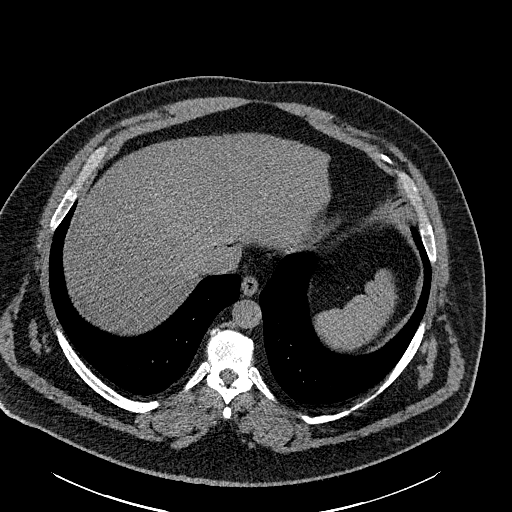
[im 67/175  lung]
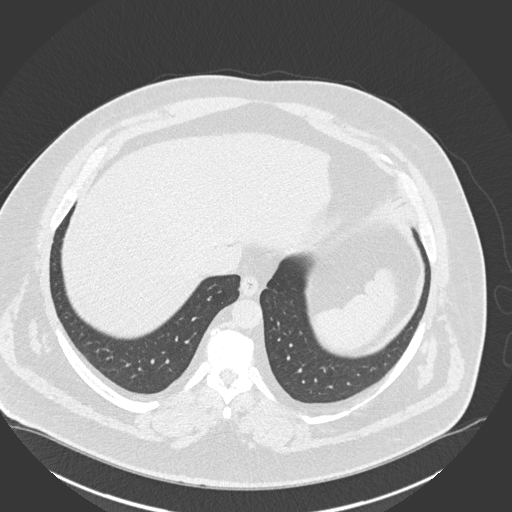
[im 81/175  lung]
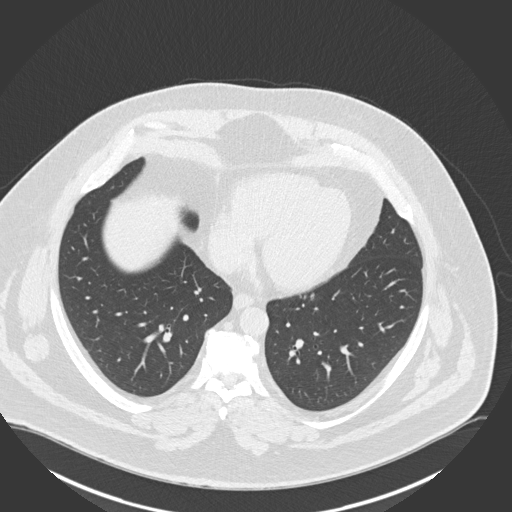
[im 94/175  lung]
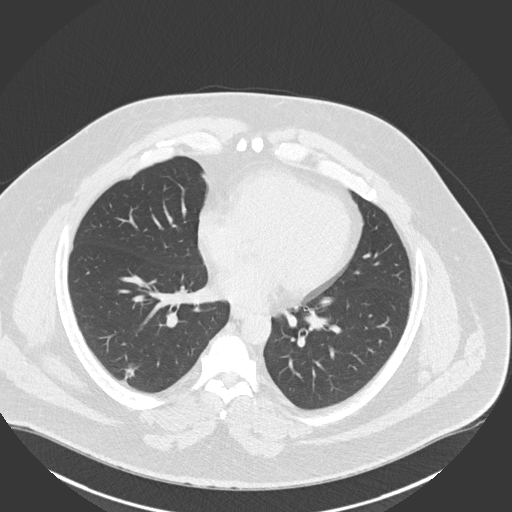
[im 108/175  lung]
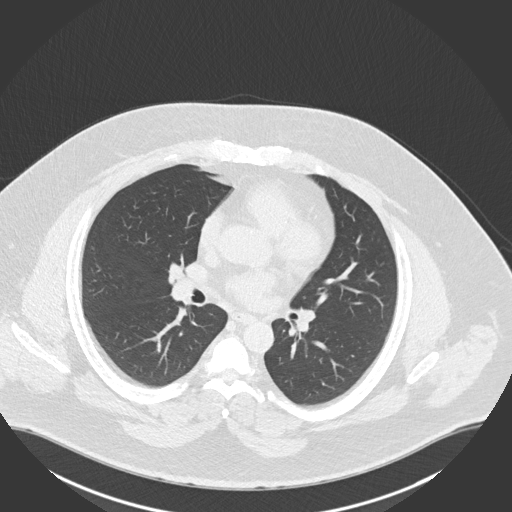
[im 121/175  mediastinal]
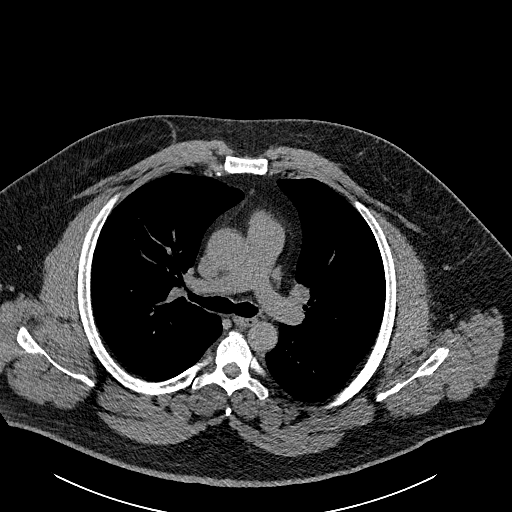
[im 121/175  lung]
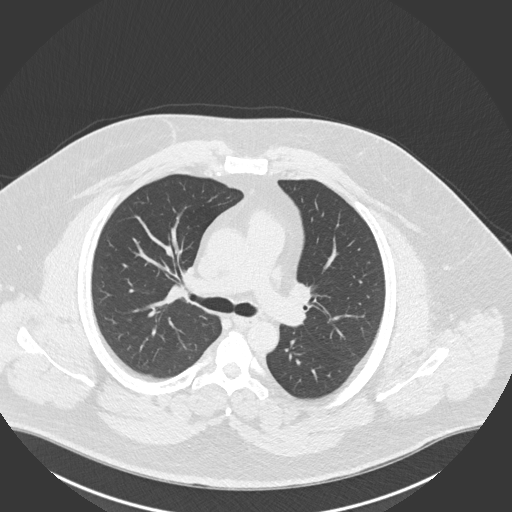
[im 134/175  lung]
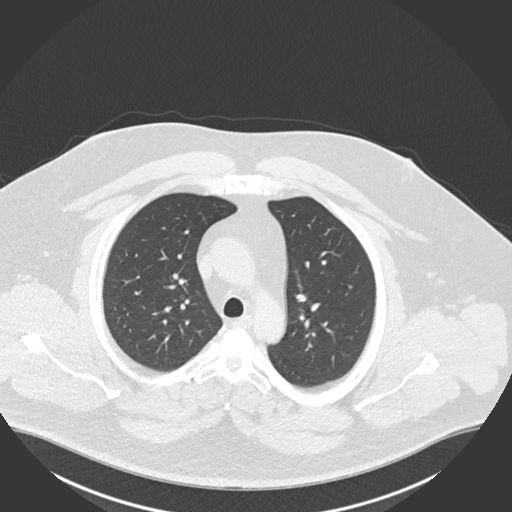
[im 148/175  lung]
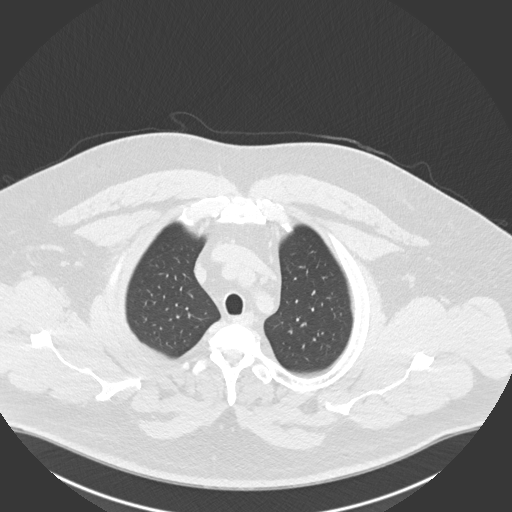
[im 161/175  lung]
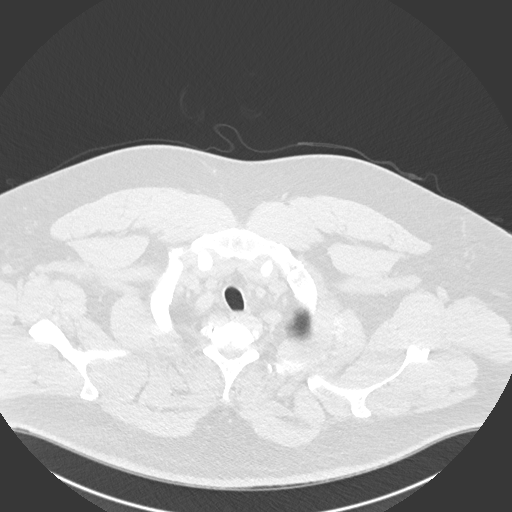

[Series 5: coronal · coronal · 0.68mm/px · 3 of 149 slices shown]
[im 30/149  lung]
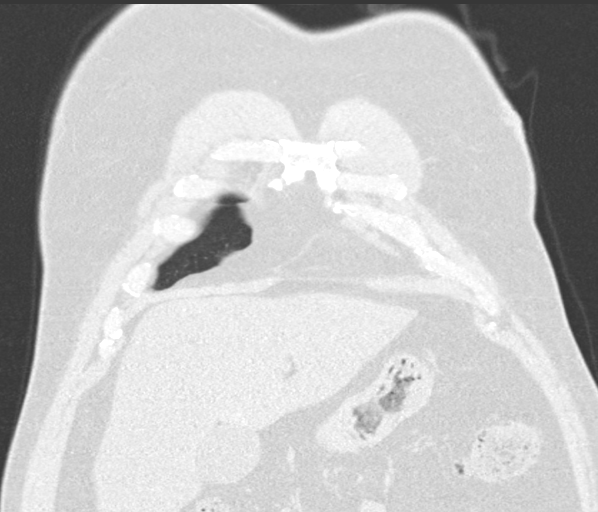
[im 60/149  lung]
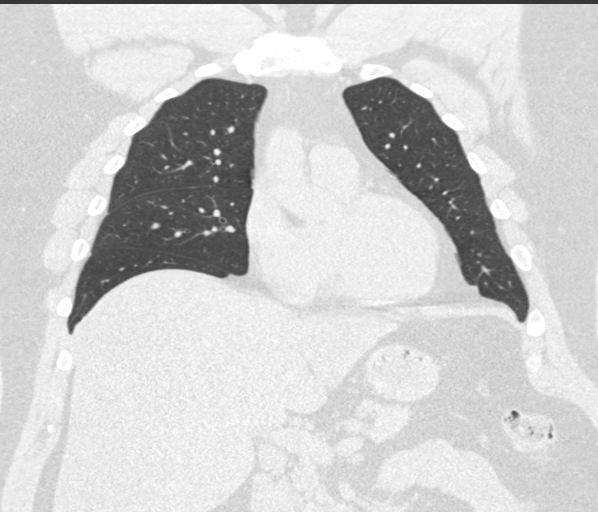
[im 89/149  lung]
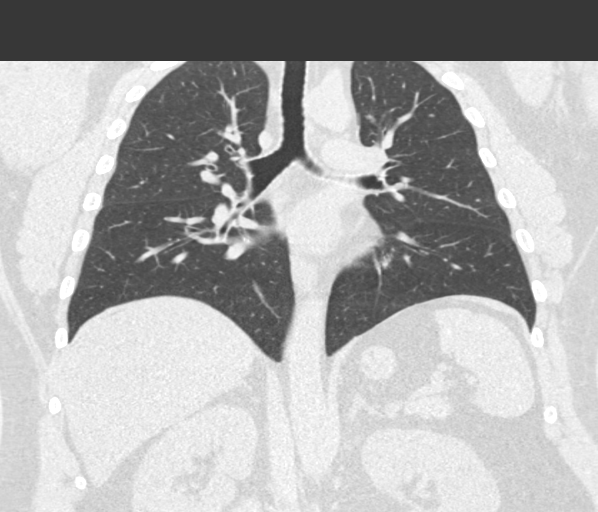

[15 of 36 positions shown; findings below may reference images not displayed]

FINDINGS: Cardiovascular: Mild coronary artery atherosclerosis. No other
significant vascular findings on noncontrast imaging. The heart size
is normal. There is no pericardial effusion.

Mediastinum/Nodes: There are no enlarged mediastinal, hilar or
axillary lymph nodes.Hilar assessment is limited by the lack of
intravenous contrast, although the hilar contours appear unchanged.
The thyroid gland, trachea and esophagus demonstrate no significant
findings.

Lungs/Pleura: No pleural effusion or pneumothorax. The previously
described area of architectural distortion posteriorly in the right
lower lobe appears unchanged. This is difficult to measure on the
axial images, but appears linear on the coronal and sagittal images,
probably reflecting postinflammatory scarring. Stability most
obvious on the coronal images. It measures approximately 10 x 9 mm
on axial image 82/3. There are tiny subpleural granulomas in the
right lung. No new or enlarging pulmonary nodules.

Upper abdomen: The liver demonstrates diffusely decreased density
consistent with steatosis. There is a 2.4 cm cyst in the upper pole
of the left kidney.

Musculoskeletal/Chest wall: There is no chest wall mass or
suspicious osseous finding.
IMPRESSION: 1. Stable area of architectural distortion posteriorly in the right
lower lobe, likely reflecting postinflammatory scarring.
2. No new or enlarging pulmonary nodules.
3. Hepatic steatosis.
4. Mild coronary artery atherosclerosis.

## 2022-03-06 ENCOUNTER — Encounter: Payer: Self-pay | Admitting: Emergency Medicine

## 2022-03-06 ENCOUNTER — Ambulatory Visit
Admission: EM | Admit: 2022-03-06 | Discharge: 2022-03-06 | Disposition: A | Discharge status: Home / Self Care | Payer: BC Managed Care – PPO

## 2022-03-06 DIAGNOSIS — T1591XA Foreign body on external eye, part unspecified, right eye, initial encounter: Secondary | ICD-10-CM

## 2022-03-06 DIAGNOSIS — H5711 Ocular pain, right eye: Secondary | ICD-10-CM | POA: Diagnosis not present

## 2022-03-06 DIAGNOSIS — T1501XA Foreign body in cornea, right eye, initial encounter: Secondary | ICD-10-CM | POA: Diagnosis not present

## 2022-03-06 NOTE — Discharge Instructions (Signed)
I spoke with Dr. Zenia Resides office.  They will work you in and help you get the metal piece out of your eye.  Head straight there. ?

## 2022-03-06 NOTE — ED Provider Notes (Signed)
?[place] ? ? ?MRN: [id number] DOB: Nov 17, 1965 ? ?Subjective:  ? ?Andrew [name]. is a 56 y.o. male presenting for suspected metallic foreign body.  Patient was grinding metal and the flake broke off and went directly into his eye.  He was wearing protective eye gear. ? ?No current facility-administered medications for this encounter. ? ?Current Outpatient Medications:  ?  ezetimibe (ZETIA) 10 MG tablet, Take 1 tablet (10 mg total) by mouth daily., Disp: 90 tablet, Rfl: 3 ?  lisinopril-hydrochlorothiazide (ZESTORETIC) 20-12.5 MG tablet, TAKE 1 TABLET BY MOUTH DAILY, Disp: 90 tablet, Rfl: 3 ?  Multiple Vitamin (MULTIVITAMIN) tablet, Take 1 tablet by mouth daily., Disp: , Rfl:  ?  oxyCODONE (OXY IR/ROXICODONE) 5 MG immediate release tablet, Take 1 tablet (5 mg total) by mouth every 6 (six) hours as needed for severe pain., Disp: 10 tablet, Rfl: 0  ? ?Allergies  ?Allergen Reactions  ? Crestor [Rosuvastatin Calcium] Other (See Comments)  ?  Joint pain  ? Testosterone Rash  ?  SEVERE RASH WITH ITCHING (TOPICAL AND INJECTION)  ? ? ?Past Medical History:  ?Diagnosis Date  ? Epididymal cyst   ? left  ? Family history of premature CAD   ? History of exercise intolerance   ? 06-03-2015--  dr Angelena Form--  no ischemia or chest pain and no arrhythmias  ? Hyperlipidemia   ? Hypertension   ? cardiologist-  dr Angelena Form  ? Left posterior fascicular block   ? Obesity, Class III, BMI 40-49.9 (morbid obesity) ([place])   ? Right hydrocele   ? Wears glasses   ?  ? ?Past Surgical History:  ?Procedure Laterality Date  ? CARDIOVASCULAR STRESS TEST  04/23/2011  ? normal nuclear study w/ no ischemia (no change from previous exam)/  normal LV function and wall motion , ef 64%  ? COLONOSCOPY  2012  ? GASTRIC ROUX-EN-Y N/A 06/27/2021  ? Procedure: LAPAROSCOPIC ROUX-EN-Y GASTRIC BYPASS WITH UPPER ENDOSCOPY;  Surgeon: Johnathan Hausen, MD;  Location: [facility];  Service: General;  Laterality: N/A;  ? HERNIA REPAIR   infant  ? HYDROCELE EXCISION Right 09/17/2016  ? Procedure: HYDROCELECTOMY ADULT;  Surgeon: Kathie Rhodes, MD;  Location: [facility];  Service: Urology;  Laterality: Right;  ? [place] EXTRACTION  2002  ? ? ?Family History  ?Problem Relation Age of Onset  ? Heart attack Mother   ? COPD Mother   ? Heart disease Mother   ? Hyperlipidemia Mother   ? Heart attack Father   ? Heart disease Father   ? Hyperlipidemia Father   ? Hypertension Other   ? ? ?Social History  ? ?Tobacco Use  ? Smoking status: Never  ? Smokeless tobacco: Never  ?Vaping Use  ? Vaping Use: Never used  ?Substance Use Topics  ? Alcohol use: Yes  ?  Alcohol/week: 2.0 - 3.0 standard drinks  ?  Types: 2 - 3 Standard drinks or equivalent per week  ?  Comment: occasionaly beer (12 pk per month)  ? Drug use: No  ? ? ?ROS ? ? ?Objective:  ? ?Vitals: ?BP 122/78   Pulse 66   Temp 98 ?F (36.7 ?C) (Oral)   Resp 20   SpO2 98%  ? ?Physical Exam ?Constitutional:   ?   General: He is not in acute distress. ?   Appearance: Normal appearance. He is well-developed and normal weight. He is not ill-appearing, toxic-appearing or diaphoretic.  ?HENT:  ?   Head: Normocephalic  and atraumatic.  ?   Right Ear: External ear normal.  ?   Left Ear: External ear normal.  ?   Nose: Nose normal.  ?   Mouth/Throat:  ?   Pharynx: Oropharynx is clear.  ?Eyes:  ?   General: No scleral icterus.    ?   Right eye: Foreign body present. No discharge or hordeolum.     ?   Left eye: No foreign body, discharge or hordeolum.  ?   Extraocular Movements: Extraocular movements intact.  ?   Right eye: Normal extraocular motion and no nystagmus.  ?   Left eye: Normal extraocular motion and no nystagmus.  ?   Conjunctiva/sclera:  ?   Right eye: Right conjunctiva is injected. No chemosis, exudate or hemorrhage. ?   Left eye: Left conjunctiva is not injected. No chemosis, exudate or hemorrhage. ? ?Cardiovascular:  ?   Rate and Rhythm: Normal rate.  ?Pulmonary:  ?   Effort:  Pulmonary effort is normal.  ?Musculoskeletal:  ?   Cervical back: Normal range of motion.  ?Neurological:  ?   Mental Status: He is alert and oriented to person, place, and time.  ?Psychiatric:     ?   Mood and Affect: Mood normal.     ?   Behavior: Behavior normal.     ?   Thought Content: Thought content normal.     ?   Judgment: Judgment normal.  ? ?Eye Exam: Eyelids everted and swept for foreign body. The eye was anesthetized with 2 drops of tetracaine and stained with fluorescein. Examination under woods lamp does reveal a foreign body in the form of a rust ring over the area outlined on the diagram. The eye was then irrigated copiously with saline. ? ? ?Assessment and Plan :  ? ?PDMP not reviewed this encounter. ? ?1. Foreign body, eye, right, initial encounter   ?2. Acute right eye pain   ? ?Case discussed with Dr. Katy Fitch, the ophthalmologist on-call.  He will kindly take him in his clinic today to help with the foreign body removal.  Patient was instructed to head straight to his office. ?  ?Jaynee Eagles, PA-C ?03/06/22 1217 ? ?

## 2022-03-06 NOTE — ED Triage Notes (Addendum)
Pt here with metal flake in right eye since last night. No vision changes ?

## 2022-05-20 ENCOUNTER — Other Ambulatory Visit: Payer: Self-pay | Admitting: Emergency Medicine

## 2022-05-20 DIAGNOSIS — E1159 Type 2 diabetes mellitus with other circulatory complications: Secondary | ICD-10-CM

## 2022-06-04 ENCOUNTER — Ambulatory Visit: Payer: BC Managed Care – PPO | Admitting: Cardiovascular Disease

## 2022-06-04 ENCOUNTER — Encounter: Payer: Self-pay | Admitting: Cardiovascular Disease

## 2022-06-04 VITALS — BP 128/84 | HR 57 | Ht 67.0 in | Wt 255.4 lb

## 2022-06-04 DIAGNOSIS — I2510 Atherosclerotic heart disease of native coronary artery without angina pectoris: Secondary | ICD-10-CM

## 2022-06-04 DIAGNOSIS — I10 Essential (primary) hypertension: Secondary | ICD-10-CM | POA: Diagnosis not present

## 2022-06-04 LAB — LIPID PANEL
Chol/HDL Ratio: 4.7 ratio (ref 0.0–5.0)
Cholesterol, Total: 145 mg/dL (ref 100–199)
HDL: 31 mg/dL — ABNORMAL LOW (ref >39)
LDL Chol Calc (NIH): 80 mg/dL (ref 0–99)
Triglycerides: 201 mg/dL — ABNORMAL HIGH (ref 0–149)
VLDL Cholesterol Cal: 34 mg/dL (ref 5–40)

## 2022-06-04 NOTE — Progress Notes (Signed)
Chief Complaint  Patient presents with   Follow-up    CAD   History of Present Illness: 56 yo male with history of DM, HLD, HTN, sleep apnea and mild CAD who is here today for cardiac follow up. I saw him in June 2012 to establish cardiology care given his strong FH of CAD. Both of his parents had premature CAD. At his first visit, he reported lack of energy and constant chest pressure.  Stress myoview 2012 with no ischemia. LVEF normal.  Exercise stress test August 2016 and April 2018 with no ischemia. CT calcium score of 13 in October 2020. He was started on Crestor in primary care and did not tolerate. He is now on Zetia and tolerating.   He is here today for follow up. The patient denies any chest pain, dyspnea, palpitations, lower extremity edema, orthopnea, PND, dizziness, near syncope or syncope. He exercises several days per week. 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   Primary Care Physician: Horald Pollen, MD  Past Medical History:  Diagnosis Date   Epididymal cyst    left   Family history of premature CAD    History of exercise intolerance    06-03-2015--  dr Angelena Form--  no ischemia or chest pain and no arrhythmias   Hyperlipidemia    Hypertension    cardiologist-  dr Angelena Form   Left posterior fascicular block    Obesity, Class III, BMI 40-49.9 (morbid obesity) ([place])    Right hydrocele    Wears glasses     Past Surgical History:  Procedure Laterality Date   CARDIOVASCULAR STRESS TEST  04/23/2011   normal nuclear study w/ no ischemia (no change from previous exam)/  normal LV function and wall motion , ef 64%   COLONOSCOPY  2012   GASTRIC ROUX-EN-Y N/A 06/27/2021   Procedure: LAPAROSCOPIC ROUX-EN-Y GASTRIC BYPASS WITH UPPER ENDOSCOPY;  Surgeon: Johnathan Hausen, MD;  Location: [facility];  Service: General;  Laterality: N/A;   HERNIA REPAIR  infant   HYDROCELE EXCISION Right 09/17/2016   Procedure: HYDROCELECTOMY ADULT;  Surgeon: Kathie Rhodes, MD;  [facility];  Service: Urology;  Laterality: Right;   [place] EXTRACTION  2002    Current Outpatient Medications  Medication Sig Dispense Refill   aspirin EC 81 MG tablet Take 81 mg by mouth daily.     lisinopril-hydrochlorothiazide (ZESTORETIC) 20-12.5 MG tablet TAKE 1 TABLET BY MOUTH DAILY 90 tablet 3   Multiple Vitamin (MULTIVITAMIN) tablet Take 1 tablet by mouth daily.     No current facility-administered medications for this visit.    Allergies  Allergen Reactions   Crestor [Rosuvastatin Calcium] Other (See Comments)    Joint pain   Testosterone Rash    SEVERE RASH WITH ITCHING (TOPICAL AND INJECTION)    Social History   Socioeconomic History   Marital status: Married    Spouse name: [name]   Number of children: 1   Years of education: [employer] education level: Not on file  Occupational History   Occupation: Therapist, occupational: [employer]  Tobacco Use   Smoking status: Never   Smokeless tobacco: Never  Vaping Use   Vaping Use: Never used  Substance and Sexual Activity   Alcohol use: Yes    Alcohol/week: 2.0 - 3.0 standard drinks of alcohol    Types: 2 - 3 Standard drinks or equivalent per week    Comment: occasionaly beer (12 pk per month)  Drug use: No   Sexual activity: Never  Other Topics Concern   Not on file  Social History Narrative   [name]) and lives at home with his wife and one child.   Patient is working full-time.   Patient has a college education.   Patient is right-handed.   Patient drinks two cups of coffee daily.   Exercise 2 times a week  On treadmill   Social Determinants of Health   Financial Resource Strain: Not on file  Food Insecurity: Not on file  Transportation Needs: Not on file  Physical Activity: Not on file  Stress: Not on file  Social Connections: Not on file  Intimate Partner Violence: Not on file    Family History  Problem Relation Age of Onset   Heart attack  Mother    COPD Mother    Heart disease Mother    Hyperlipidemia Mother    Heart attack Father    Heart disease Father    Hyperlipidemia Father    Hypertension Other     Review of Systems:  As stated in the HPI and otherwise negative.   BP 128/84   Pulse (!) 57   Ht '5\' 7"'$  (1.702 m)   Wt 255 lb 6.4 oz (115.8 kg)   SpO2 97%   BMI 40.00 kg/m   Physical Examination: General: Well developed, well nourished, NAD  HEENT: OP clear, mucus membranes moist  SKIN: warm, dry. No rashes. Neuro: No focal deficits  Musculoskeletal: Muscle strength 5/5 all ext  Psychiatric: Mood and affect normal  Neck: No JVD, no carotid bruits, no thyromegaly, no lymphadenopathy.  Lungs:Clear bilaterally, no wheezes, rhonci, crackles Cardiovascular: Regular rate and rhythm. No murmurs, gallops or rubs. Abdomen:Soft. Bowel sounds present. Non-tender.  Extremities: No lower extremity edema. Pulses are 2 + in the bilateral DP/PT.  EKG:  EKG is ordered today.  The EKG is personally reviewed and shows sinus brady, rate 57 bpm.   Recent Labs: 06/28/2021: Hemoglobin 13.2; Platelets 175 08/28/2021: ALT 45; BUN 20; Creatinine, Ser 0.85; Potassium 3.9; Sodium 140   Lipid Panel    Component Value Date/Time   CHOL 123 08/28/2021 0828   CHOL 125 08/22/2020 1003   TRIG 215.0 (H) 08/28/2021 0828   HDL 28.40 (L) 08/28/2021 0828   HDL 29 (L) 08/22/2020 1003   CHOLHDL 4 08/28/2021 0828   VLDL 43.0 (H) 08/28/2021 0828   LDLCALC 59 08/22/2020 1003   LDLDIRECT 69.0 08/28/2021 0828     Wt Readings from Last 3 Encounters:  06/04/22 255 lb 6.4 oz (115.8 kg)  08/28/21 264 lb (119.7 kg)  08/21/21 267 lb (121.1 kg)    Assessment and Plan:   1. CAD without angina: He has no chest pian suggestive of angina.  Normal stress testing in 2018. Calcium score of 13 in 2020. He remains active. No plans for ischemic testing at this time. LDL was well controlled in 2021. He does not tolerate statins. He stopped his Zetia. Will  check lipids today.   2. HTN: BP is controlled. No changes  Labs/ tests ordered today include:   Orders Placed This Encounter  Procedures   Lipid Profile   EKG 12-Lead   Disposition:   FU with me in 12  months  Signed, Lauree Chandler, MD 06/04/2022 9:05 AM    [facility] [address], [place], [place]  [address] Phone: [phone]; Fax: [phone]

## 2022-06-04 NOTE — Patient Instructions (Signed)
Medication Instructions:  No changes *If you need a refill on your cardiac medications before your next appointment, please call your pharmacy*   Lab Work: Today: lipids  If you have labs (blood work) drawn today and your tests are completely normal, you will receive your results only by: [place] (if you have MyChart) OR A paper copy in the mail If you have any lab test that is abnormal or we need to change your treatment, we will call you to review the results.   Testing/Procedures: none   Follow-Up: At [facility], you and your health needs are our priority.  As part of our continuing mission to provide you with exceptional heart care, we have created designated Provider Care Teams.  These Care Teams include your primary Cardiologist (physician) and Advanced Practice Providers (APPs -  Physician Assistants and Nurse Practitioners) who all work together to provide you with the care you need, when you need it.   Your next appointment:   12 month(s)  The format for your next appointment:   In Person  Provider:   Lauree Chandler, MD     Important Information About Sugar

## 2022-06-07 ENCOUNTER — Telehealth: Payer: Self-pay | Admitting: *Deleted

## 2022-06-07 DIAGNOSIS — I2510 Atherosclerotic heart disease of native coronary artery without angina pectoris: Secondary | ICD-10-CM

## 2022-06-07 MED ORDER — EZETIMIBE 10 MG PO TABS: 10.0000 mg | ORAL_TABLET | Freq: Every day | ORAL | 3 refills | Status: DC

## 2022-06-07 NOTE — Telephone Encounter (Signed)
Orders placed for zetia and repeat lipids per lab results.

## 2022-06-07 NOTE — Telephone Encounter (Signed)
-----   Message from Burnell Blanks, MD sent at 06/05/2022  8:20 AM EDT ----- LDL not at goal of 70. I would have him resume Zetia if he is willing. He does not tolerate statins. If he does not wish to restart Zetia, can refer to the lipid clinic. Thanks, chris

## 2022-08-16 NOTE — Progress Notes (Deleted)
PATIENT: Andrew [name]. DOB: 28-Mar-1966  REASON FOR VISIT: follow up HISTORY FROM: patient  Virtual Visit via Telephone Note  I connected with Andrew [name]. on 08/20/22 at  8:00 AM EDT by telephone and verified that I am speaking with the correct person using two identifiers.   I discussed the limitations, risks, security and privacy concerns of performing an evaluation and management service by telephone and the availability of in person appointments. I also discussed with the patient that there may be a patient responsible charge related to this service. The patient expressed understanding and agreed to proceed.   History of Present Illness:  08/16/22 ALL: Andrew [name] returns for follow up for OSA on CPAP. He is doing well. He is using CPAP nightly for about . He denies concerns with machine or supplies.   Set up?   08/21/2021 ALL: Andrew [name] returns for follow up for OSA on CPAP. He continues to do well on therapy. He is using CPAP nightly for at least 4 hours. He denies concerns with machine or supplies. He is resting well.   He is s/p roux-en-y gastric bypass 06/27/2021. He has lost over 55 pounds. He is followed closely. He would like to consider repeat sleep study next year.     08/23/2020 ALL:  Andrew [name]. is a 56 y.o. male here today for follow up for OSA on CPAP.  He reports that he is doing very well with CPAP therapy.  He is using CPAP nightly.  He denies any concerns with CPAP or supplies.  He is currently using a nasal mask.  He does have facial hair.  He has not noted an excess leak at home.  He is followed closely by primary care.  He had lab work performed yesterday.  A1c 6.9.  Triglycerides 224, HDL 29.  Compliance report dated 07/23/2020 through 08/21/2020 reveals that he used CPAP 30 of the past 30 days for compliance of 100%.  The CPAP greater than 4 hours all 30 days.  Average usage was 7 hours and 50 minutes.  Residual AHI 0.6 on 5 to 20 cm  of water and EPR of 3.  There is a leak in the 95th percentile of 32.2 L/min. Per Dr Dohmeier's last note, she suggested increasing min pressure to 7. It does not appear this was ordered.    Observations/Objective:  Generalized: Well developed, in no acute distress  Mentation: Alert oriented to time, place, history taking. Follows all commands speech and language fluent   Assessment and Plan:  56 y.o. year old male  has a past medical history of Epididymal cyst, Family history of premature CAD, History of exercise intolerance, Hyperlipidemia, Hypertension, Left posterior fascicular block, Obesity, Class III, BMI 40-49.9 (morbid obesity) ([place]), Right hydrocele, and Wears glasses. here with    ICD-10-CM   1. OSA on CPAP  G47.33       No orders of the defined types were placed in this encounter.   No orders of the defined types were placed in this encounter.    Follow Up Instructions:  I discussed the assessment and treatment plan with the patient. The patient was provided an opportunity to ask questions and all were answered. The patient agreed with the plan and demonstrated an understanding of the instructions.   The patient was advised to call back or seek an in-person evaluation if the symptoms worsen or if the condition fails to improve as anticipated.  I provided *** minutes of  non-face-to-face time during this encounter. Patient located at their place of residence during [place] visit. Provider is in the office.    Debbora Presto, NP

## 2022-08-16 NOTE — Patient Instructions (Incomplete)

## 2022-08-21 ENCOUNTER — Telehealth: Payer: BC Managed Care – PPO | Admitting: Family Medicine

## 2022-08-21 ENCOUNTER — Encounter: Payer: Self-pay | Admitting: Family Medicine

## 2022-08-21 DIAGNOSIS — G4733 Obstructive sleep apnea (adult) (pediatric): Secondary | ICD-10-CM | POA: Diagnosis not present

## 2022-08-21 NOTE — Progress Notes (Signed)
PATIENT: Andrew [name]. DOB: [birth year]  REASON FOR VISIT: follow up HISTORY FROM: patient  Virtual Visit via Telephone Note  I connected with Andrew [name]. on 08/21/22 at  9:00 AM EDT by telephone and verified that I am speaking with the correct person using two identifiers.   I discussed the limitations, risks, security and privacy concerns of performing an evaluation and management service by telephone and the availability of in person appointments. I also discussed with the patient that there may be a patient responsible charge related to this service. The patient expressed understanding and agreed to proceed.   History of Present Illness:  08/16/22 ALL: Andrew [name] returns for follow up for OSA on CPAP. He is doing well. He is using CPAP nightly for about . He denies concerns with machine or supplies. He is using CPAP nightly for about 8 hours. He does travel for work and reports now is a very busy time for him. His machine was set up in 2018.     08/21/2021 ALL: Andrew [name] returns for follow up for OSA on CPAP. He continues to do well on therapy. He is using CPAP nightly for at least 4 hours. He denies concerns with machine or supplies. He is resting well.   He is s/p roux-en-y gastric bypass 06/27/2021. He has lost over 55 pounds. He is followed closely. He would like to consider repeat sleep study next year.     08/23/2020 ALL:  Andrew [name]. is a 56 y.o. male here today for follow up for OSA on CPAP.  He reports that he is doing very well with CPAP therapy.  He is using CPAP nightly.  He denies any concerns with CPAP or supplies.  He is currently using a nasal mask.  He does have facial hair.  He has not noted an excess leak at home.  He is followed closely by primary care.  He had lab work performed yesterday.  A1c 6.9.  Triglycerides 224, HDL 29.  Compliance report dated 07/23/2020 through 08/21/2020 reveals that he used CPAP 30 of the past 30 days for  compliance of 100%.  The CPAP greater than 4 hours all 30 days.  Average usage was 7 hours and 50 minutes.  Residual AHI 0.6 on 5 to 20 cm of water and EPR of 3.  There is a leak in the 95th percentile of 32.2 L/min. Per Dr Dohmeier's last note, she suggested increasing min pressure to 7. It does not appear this was ordered.    Observations/Objective:  Generalized: Well developed, in no acute distress  Mentation: Alert oriented to time, place, history taking. Follows all commands speech and language fluent   Assessment and Plan:  56 y.o. year old male  has a past medical history of Epididymal cyst, Family history of premature CAD, History of exercise intolerance, Hyperlipidemia, Hypertension, Left posterior fascicular block, Obesity, Class III, BMI 40-49.9 (morbid obesity) ([place]), Right hydrocele, and Wears glasses. here with    ICD-10-CM   1. OSA on CPAP  G47.33 For home use only DME continuous positive airway pressure (CPAP)       Andrew [name] is doing great on CPAP therapy. Compliance report shows excellent compliance. He was encouraged to continue using CPAP nightly for at least 4 hours. He is eligible for a new machine. He reports that work is very busy and he is spending more time out of town. He will call me when work settles down and he can update HST.  He will return to see me in 31-90 days from set up of new machine or 1 year, whichever comes first.   Orders Placed This Encounter  Procedures   For home use only DME continuous positive airway pressure (CPAP)    Supplies    Order Specific Question:   Length of Need    Answer:   Lifetime    Order Specific Question:   Patient has OSA or probable OSA    Answer:   Yes    Order Specific Question:   Is the patient currently using CPAP in the home    Answer:   Yes    Order Specific Question:   Settings    Answer:   Other see comments    Order Specific Question:   CPAP supplies needed    Answer:   Mask, headgear, cushions, filters, heated  tubing and water chamber    No orders of the defined types were placed in this encounter.    Follow Up Instructions:  I discussed the assessment and treatment plan with the patient. The patient was provided an opportunity to ask questions and all were answered. The patient agreed with the plan and demonstrated an understanding of the instructions.   The patient was advised to call back or seek an in-person evaluation if the symptoms worsen or if the condition fails to improve as anticipated.  I provided 15 minutes of non-face-to-face time during this encounter. Patient located at their place of residence during [place] visit. Provider is in the office.    Debbora Presto, NP

## 2022-08-21 NOTE — Patient Instructions (Signed)
Please continue using your CPAP regularly. While your insurance requires that you use CPAP at least 4 hours each night on 70% of the nights, I recommend, that you not skip any nights and use it throughout the night if you can. Getting used to CPAP and staying with the treatment long term does take time and patience and discipline. Untreated obstructive sleep apnea when it is moderate to severe can have an adverse impact on cardiovascular health and raise her risk for heart disease, arrhythmias, hypertension, congestive heart failure, stroke and diabetes. Untreated obstructive sleep apnea causes sleep disruption, nonrestorative sleep, and sleep deprivation. This can have an impact on your day to day functioning and cause daytime sleepiness and impairment of cognitive function, memory loss, mood disturbance, and problems focussing. Using CPAP regularly can improve these symptoms.   Call me when you are ready to repeat home sleep test for new CPAP.

## 2022-08-27 ENCOUNTER — Telehealth: Payer: Self-pay

## 2022-09-10 ENCOUNTER — Other Ambulatory Visit: Payer: BC Managed Care – PPO

## 2022-09-18 ENCOUNTER — Ambulatory Visit: Payer: BC Managed Care – PPO | Attending: Cardiovascular Disease

## 2022-09-18 DIAGNOSIS — I2510 Atherosclerotic heart disease of native coronary artery without angina pectoris: Secondary | ICD-10-CM | POA: Diagnosis not present

## 2022-09-18 LAB — LIPID PANEL
Chol/HDL Ratio: 3.1 ratio (ref 0.0–5.0)
Cholesterol, Total: 111 mg/dL (ref 100–199)
HDL: 36 mg/dL — ABNORMAL LOW (ref >39)
LDL Chol Calc (NIH): 47 mg/dL (ref 0–99)
Triglycerides: 164 mg/dL — ABNORMAL HIGH (ref 0–149)
VLDL Cholesterol Cal: 28 mg/dL (ref 5–40)

## 2023-01-31 ENCOUNTER — Encounter (HOSPITAL_COMMUNITY): Payer: Self-pay | Admitting: *Deleted

## 2023-02-19 DIAGNOSIS — L720 Epidermal cyst: Secondary | ICD-10-CM | POA: Diagnosis not present

## 2023-07-16 NOTE — Progress Notes (Unsigned)
Cardiology Office Note    Patient Name: Andrew [name]. Date of Encounter: 07/16/2023  Primary Care Provider:  Georgina Quint, MD Primary Cardiologist:  Verne Carrow, MD Primary Electrophysiologist: None   Past Medical History    Past Medical History:  Diagnosis Date   Epididymal cyst    left   Family history of premature CAD    History of exercise intolerance    06-03-2015--  dr Clifton James--  no ischemia or chest pain and no arrhythmias   Hyperlipidemia    Hypertension    cardiologist-  dr Clifton James   Left posterior fascicular block    Obesity, Class III, BMI 40-49.9 (morbid obesity) (HCC)    Right hydrocele    Wears glasses     History of Present Illness  Andrew [name]. is a 57 y.o. male with a PMH of premature CAD, HTN, HLD, obesity who presents today for 1 year follow-up.  Andrew [name] was seen initially by Dr. Clifton James in 2012 for evaluation of chest pain and decreased energy.  He has a past medical history significant for premature CAD in both of his parents.  Stress Myoview was completed that showed no ischemia followed by ETT in 2016 and 2018 that also showed no ischemia.  He underwent a CT calcium scoring and 07/2019 that showed very low calcium buildup and score of 13.  He was started on Crestor but did not tolerate and is currently doing well with Zetia.  He was last seen by Dr. Clifton James on 06/04/2022 and was doing well with no chest pain.  He was exercising several days a week and BP was well-controlled.  Most recent lipid panel was completed on 08/2022 showing LDL at goal on Zetia.   During today's visit the patient reports*** .  Patient denies chest pain, palpitations, dyspnea, PND, orthopnea, nausea, vomiting, dizziness, syncope, edema, weight gain, or early satiety.  ***Notes: -Last ischemic evaluation: ETT completed 01/2018 and calcium scoring completed 07/2019 -Last echo: None -Interim ED visits: Review of Systems  Please see the  history of present illness.    All other systems reviewed and are otherwise negative except as noted above.  Physical Exam    Wt Readings from Last 3 Encounters:  06/04/22 255 lb 6.4 oz (115.8 kg)  08/28/21 264 lb (119.7 kg)  08/21/21 267 lb (121.1 kg)   ZO:[id number] were no vitals filed for this visit.,There is no height or weight on file to calculate BMI. GEN: Well nourished, well developed in no acute distress Neck: No JVD; No carotid bruits Pulmonary: Clear to auscultation without rales, wheezing or rhonchi  Cardiovascular: Normal rate. Regular rhythm. Normal S1. Normal S2.   Murmurs: There is no murmur.  ABDOMEN: Soft, non-tender, non-distended EXTREMITIES:  No edema; No deformity   EKG/LABS/ Recent Cardiac Studies   ECG personally reviewed by me today - ***  Risk Assessment/Calculations:   {Does this patient have ATRIAL FIBRILLATION?:[id number]}      Lab Results  Component Value Date   WBC 9.1 06/28/2021   HGB 13.2 06/28/2021   HCT 38.0 (L) 06/28/2021   MCV 93.8 06/28/2021   PLT 175 06/28/2021   Lab Results  Component Value Date   CREATININE 0.85 08/28/2021   BUN 20 08/28/2021   NA 140 08/28/2021   K 3.9 08/28/2021   CL 102 08/28/2021   CO2 31 08/28/2021   Lab Results  Component Value Date   CHOL 111 09/18/2022   HDL 36 (L) 09/18/2022   LDLCALC  47 09/18/2022   LDLDIRECT 69.0 08/28/2021   TRIG 164 (H) 09/18/2022   CHOLHDL 3.1 09/18/2022    Lab Results  Component Value Date   HGBA1C 5.5 08/28/2021   Assessment & Plan    1.  Essential hypertension: -Patient's blood pressure today was***  2.  Hyperlipidemia: Patient -Patient's last LDL cholesterol was*** -Continue***  3.  Family history of premature CAD: -Has history of premature CAD in both parents.  4.***      Disposition: Follow-up with Verne Carrow, MD or APP in *** months {Are you ordering a CV Procedure (e.g. stress test, cath, DCCV, TEE, etc)?   Press F2        :[id number]}    Signed, Napoleon Form, Leodis Rains, NP 07/16/2023, 10:14 AM [facility]

## 2023-07-18 ENCOUNTER — Ambulatory Visit: Payer: BC Managed Care – PPO | Attending: Nurse Practitioner | Admitting: Nurse Practitioner

## 2023-07-18 ENCOUNTER — Encounter: Payer: Self-pay | Admitting: Nurse Practitioner

## 2023-07-18 VITALS — BP 128/74 | HR 64 | Ht 66.0 in | Wt 260.6 lb

## 2023-07-18 DIAGNOSIS — E785 Hyperlipidemia, unspecified: Secondary | ICD-10-CM | POA: Diagnosis not present

## 2023-07-18 DIAGNOSIS — I10 Essential (primary) hypertension: Secondary | ICD-10-CM | POA: Diagnosis not present

## 2023-07-18 DIAGNOSIS — Z6841 Body Mass Index (BMI) 40.0 and over, adult: Secondary | ICD-10-CM

## 2023-07-18 DIAGNOSIS — Z8249 Family history of ischemic heart disease and other diseases of the circulatory system: Secondary | ICD-10-CM | POA: Diagnosis not present

## 2023-07-18 NOTE — Patient Instructions (Signed)
Medication Instructions:  The current medical regimen is effective;  continue present plan and medications as directed. Please refer to the Current Medication list given to you today.  *If you need a refill on your cardiac medications before your next appointment, please call your pharmacy*  Lab Work: FASTING LIPID AND LFT TODAY If you have labs (blood work) drawn today and your tests are completely normal, you will receive your results only by:  MyChart Message (if you have MyChart) OR  A paper copy in the mail If you have any lab test that is abnormal or we need to change your treatment, we will call you to review the results.  Other Instructions PLEASE INCREASE PHYSICAL ACTIVITY-GOAL=150 MINUTES OF MODERATE PHYSICAL ACTIVITY-ONLY AS TOLERATED  Follow-Up: At [facility], you and your health needs are our priority.  As part of our continuing mission to provide you with exceptional heart care, we have created designated Provider Care Teams.  These Care Teams include your primary Cardiologist (physician) and Advanced Practice Providers (APPs -  Physician Assistants and Nurse Practitioners) who all work together to provide you with the care you need, when you need it.   Your next appointment:   12 month(s)  Provider:   Verne Carrow, MD  or Robin Searing, NP

## 2023-07-19 LAB — HEPATIC FUNCTION PANEL
ALT: 53 IU/L — ABNORMAL HIGH (ref 0–44)
AST: 34 IU/L (ref 0–40)
Albumin: 4.4 g/dL (ref 3.8–4.9)
Alkaline Phosphatase: 81 IU/L (ref 44–121)
Bilirubin Total: 1.1 mg/dL (ref 0.0–1.2)
Bilirubin, Direct: 0.23 mg/dL (ref 0.00–0.40)
Total Protein: 7.1 g/dL (ref 6.0–8.5)

## 2023-07-19 LAB — LIPID PANEL
Chol/HDL Ratio: 3.9 ratio (ref 0.0–5.0)
Cholesterol, Total: 122 mg/dL (ref 100–199)
HDL: 31 mg/dL — ABNORMAL LOW (ref >39)
LDL Chol Calc (NIH): 55 mg/dL (ref 0–99)
Triglycerides: 222 mg/dL — ABNORMAL HIGH (ref 0–149)
VLDL Cholesterol Cal: 36 mg/dL (ref 5–40)

## 2023-08-13 ENCOUNTER — Other Ambulatory Visit: Payer: Self-pay | Admitting: Emergency Medicine

## 2023-08-13 DIAGNOSIS — E1159 Type 2 diabetes mellitus with other circulatory complications: Secondary | ICD-10-CM

## 2023-08-28 ENCOUNTER — Other Ambulatory Visit: Payer: Self-pay

## 2023-08-28 MED ORDER — EZETIMIBE 10 MG PO TABS: 10.0000 mg | ORAL_TABLET | Freq: Every day | ORAL | 3 refills | Status: DC

## 2024-01-31 ENCOUNTER — Encounter (HOSPITAL_COMMUNITY): Payer: Self-pay | Admitting: *Deleted

## 2024-03-15 ENCOUNTER — Other Ambulatory Visit: Payer: Self-pay | Admitting: Emergency Medicine

## 2024-03-15 DIAGNOSIS — I152 Hypertension secondary to endocrine disorders: Secondary | ICD-10-CM

## 2024-05-29 ENCOUNTER — Other Ambulatory Visit: Payer: Self-pay | Admitting: Emergency Medicine

## 2024-05-29 DIAGNOSIS — I152 Hypertension secondary to endocrine disorders: Secondary | ICD-10-CM

## 2024-11-19 ENCOUNTER — Other Ambulatory Visit: Payer: Self-pay

## 2024-11-24 MED ORDER — EZETIMIBE 10 MG PO TABS: 10.0000 mg | ORAL_TABLET | Freq: Every day | ORAL | 0 refills | Status: AC

## 2024-11-25 ENCOUNTER — Other Ambulatory Visit: Payer: Self-pay | Admitting: Emergency Medicine

## 2024-11-25 ENCOUNTER — Other Ambulatory Visit: Payer: Self-pay | Admitting: Cardiovascular Disease

## 2024-11-25 DIAGNOSIS — E1159 Type 2 diabetes mellitus with other circulatory complications: Secondary | ICD-10-CM

## 2025-02-19 ENCOUNTER — Ambulatory Visit: Admitting: Cardiovascular Disease
